# Patient Record
Sex: Female | Born: 1950 | Race: White | Hispanic: No | Smoking: Former smoker
Health system: Southern US, Community
[De-identification: ages and names within clinical notes are randomized; demographics above are authoritative.]

## PROBLEM LIST (undated history)

## (undated) DIAGNOSIS — I10 Essential (primary) hypertension: Secondary | ICD-10-CM

## (undated) DIAGNOSIS — M1990 Unspecified osteoarthritis, unspecified site: Secondary | ICD-10-CM

## (undated) DIAGNOSIS — F419 Anxiety disorder, unspecified: Secondary | ICD-10-CM

## (undated) DIAGNOSIS — E785 Hyperlipidemia, unspecified: Secondary | ICD-10-CM

## (undated) DIAGNOSIS — I4891 Unspecified atrial fibrillation: Secondary | ICD-10-CM

## (undated) DIAGNOSIS — F32A Depression, unspecified: Secondary | ICD-10-CM

## (undated) DIAGNOSIS — K219 Gastro-esophageal reflux disease without esophagitis: Secondary | ICD-10-CM

## (undated) DIAGNOSIS — J449 Chronic obstructive pulmonary disease, unspecified: Secondary | ICD-10-CM

## (undated) DIAGNOSIS — H9193 Unspecified hearing loss, bilateral: Secondary | ICD-10-CM

## (undated) DIAGNOSIS — R519 Headache, unspecified: Secondary | ICD-10-CM

## (undated) HISTORY — PX: TUBAL LIGATION: SHX77

## (undated) HISTORY — PX: ABDOMINAL HYSTERECTOMY: SHX81

## (undated) HISTORY — PX: TONSILLECTOMY: SUR1361

---

## 2003-11-23 ENCOUNTER — Other Ambulatory Visit: Payer: Self-pay

## 2005-08-19 ENCOUNTER — Ambulatory Visit: Payer: Self-pay | Admitting: Internal Medicine

## 2005-10-05 ENCOUNTER — Ambulatory Visit: Payer: Self-pay | Admitting: Internal Medicine

## 2006-04-13 ENCOUNTER — Ambulatory Visit: Payer: Self-pay | Admitting: Internal Medicine

## 2006-07-31 ENCOUNTER — Ambulatory Visit: Payer: Self-pay | Admitting: Unknown Physician Specialty

## 2007-02-07 ENCOUNTER — Ambulatory Visit: Payer: Self-pay | Admitting: Gastroenterology

## 2007-08-31 ENCOUNTER — Ambulatory Visit: Payer: Self-pay | Admitting: Internal Medicine

## 2008-10-03 ENCOUNTER — Ambulatory Visit: Payer: Self-pay | Admitting: Internal Medicine

## 2009-12-28 HISTORY — PX: COLONOSCOPY: SHX5424

## 2009-12-28 HISTORY — PX: ESOPHAGOGASTRODUODENOSCOPY ENDOSCOPY: SHX5814

## 2009-12-30 ENCOUNTER — Ambulatory Visit: Payer: Self-pay | Admitting: Family Medicine

## 2010-04-04 ENCOUNTER — Ambulatory Visit: Payer: Self-pay | Admitting: Unknown Physician Specialty

## 2010-05-30 ENCOUNTER — Ambulatory Visit: Payer: Self-pay | Admitting: Gastroenterology

## 2012-06-21 ENCOUNTER — Ambulatory Visit: Payer: Self-pay | Admitting: Internal Medicine

## 2013-07-03 ENCOUNTER — Ambulatory Visit: Payer: Self-pay | Admitting: Internal Medicine

## 2014-02-26 ENCOUNTER — Emergency Department: Payer: Self-pay | Admitting: Emergency Medicine

## 2014-02-26 LAB — URINALYSIS, COMPLETE
Bilirubin,UR: NEGATIVE
Glucose,UR: NEGATIVE mg/dL (ref 0–75)
Ketone: NEGATIVE
Nitrite: NEGATIVE
PH: 5 (ref 4.5–8.0)
PROTEIN: NEGATIVE
RBC,UR: 13 /HPF (ref 0–5)
SPECIFIC GRAVITY: 1.019 (ref 1.003–1.030)

## 2014-02-26 LAB — COMPREHENSIVE METABOLIC PANEL
ALT: 23 U/L (ref 12–78)
Albumin: 3.9 g/dL (ref 3.4–5.0)
Alkaline Phosphatase: 116 U/L
Anion Gap: 2 — ABNORMAL LOW (ref 7–16)
BUN: 12 mg/dL (ref 7–18)
Bilirubin,Total: 0.2 mg/dL (ref 0.2–1.0)
CHLORIDE: 106 mmol/L (ref 98–107)
Calcium, Total: 8.9 mg/dL (ref 8.5–10.1)
Co2: 32 mmol/L (ref 21–32)
Creatinine: 0.88 mg/dL (ref 0.60–1.30)
EGFR (African American): >60
GLUCOSE: 92 mg/dL (ref 65–99)
Osmolality: 279 (ref 275–301)
Potassium: 3.7 mmol/L (ref 3.5–5.1)
SGOT(AST): 25 U/L (ref 15–37)
SODIUM: 140 mmol/L (ref 136–145)
Total Protein: 7.2 g/dL (ref 6.4–8.2)

## 2014-02-26 LAB — CBC
HCT: 45.2 % (ref 35.0–47.0)
HGB: 15 g/dL (ref 12.0–16.0)
MCH: 30.7 pg (ref 26.0–34.0)
MCHC: 33.1 g/dL (ref 32.0–36.0)
MCV: 93 fL (ref 80–100)
Platelet: 251 10*3/uL (ref 150–440)
RBC: 4.88 10*6/uL (ref 3.80–5.20)
RDW: 13.7 % (ref 11.5–14.5)
WBC: 9.2 10*3/uL (ref 3.6–11.0)

## 2014-02-26 LAB — TROPONIN I: Troponin-I: <0.02 ng/mL

## 2014-02-26 LAB — ETHANOL
Ethanol %: <0.003 % (ref 0.000–0.080)
Ethanol: <3 mg/dL

## 2014-02-27 LAB — DRUG SCREEN, URINE

## 2014-11-28 ENCOUNTER — Ambulatory Visit: Payer: Self-pay

## 2015-02-05 DIAGNOSIS — E7800 Pure hypercholesterolemia, unspecified: Secondary | ICD-10-CM | POA: Insufficient documentation

## 2015-02-05 DIAGNOSIS — K219 Gastro-esophageal reflux disease without esophagitis: Secondary | ICD-10-CM | POA: Diagnosis present

## 2016-09-22 ENCOUNTER — Other Ambulatory Visit: Payer: Self-pay | Admitting: Family Medicine

## 2016-09-22 DIAGNOSIS — Z1231 Encounter for screening mammogram for malignant neoplasm of breast: Secondary | ICD-10-CM

## 2016-10-16 ENCOUNTER — Ambulatory Visit
Admission: RE | Admit: 2016-10-16 | Discharge: 2016-10-16 | Disposition: A | Discharge status: Home / Self Care | Payer: 59 | Source: Ambulatory Visit | Attending: Family Medicine | Admitting: Family Medicine

## 2016-10-16 ENCOUNTER — Other Ambulatory Visit: Payer: Self-pay | Admitting: Family Medicine

## 2016-10-16 DIAGNOSIS — Z1231 Encounter for screening mammogram for malignant neoplasm of breast: Secondary | ICD-10-CM

## 2017-12-24 ENCOUNTER — Other Ambulatory Visit: Payer: Self-pay | Admitting: Family Medicine

## 2017-12-24 DIAGNOSIS — M5416 Radiculopathy, lumbar region: Secondary | ICD-10-CM

## 2017-12-25 ENCOUNTER — Other Ambulatory Visit: Payer: Self-pay | Admitting: Physical Medicine and Rehabilitation

## 2017-12-25 DIAGNOSIS — M545 Low back pain: Secondary | ICD-10-CM

## 2018-01-03 ENCOUNTER — Ambulatory Visit: Payer: 59

## 2018-01-03 ENCOUNTER — Ambulatory Visit
Admission: RE | Admit: 2018-01-03 | Discharge: 2018-01-03 | Disposition: A | Discharge status: Home / Self Care | Payer: 59 | Source: Ambulatory Visit | Attending: Physical Medicine and Rehabilitation | Admitting: Physical Medicine and Rehabilitation

## 2018-01-03 DIAGNOSIS — M545 Low back pain: Secondary | ICD-10-CM

## 2019-06-01 DIAGNOSIS — J449 Chronic obstructive pulmonary disease, unspecified: Secondary | ICD-10-CM | POA: Diagnosis present

## 2020-01-23 ENCOUNTER — Ambulatory Visit: Payer: 59 | Attending: Internal Medicine

## 2020-01-23 DIAGNOSIS — Z8616 Personal history of COVID-19: Secondary | ICD-10-CM

## 2020-01-23 DIAGNOSIS — Z20822 Contact with and (suspected) exposure to covid-19: Secondary | ICD-10-CM

## 2020-01-23 HISTORY — DX: Personal history of COVID-19: Z86.16

## 2020-01-24 LAB — NOVEL CORONAVIRUS, NAA: SARS-CoV-2, NAA: DETECTED — AB

## 2020-01-25 ENCOUNTER — Telehealth: Payer: Self-pay | Admitting: Internal Medicine

## 2020-01-25 NOTE — Telephone Encounter (Signed)
Called to discuss with patient about Covid symptoms and the use of bamlanivimab, a monoclonal antibody infusion for those with mild to moderate Covid symptoms and at high risk of hospitalization.   Pt appears to qualify for this infusion at [facility] infusion center due to co-morbid conditions and/or member of at risk group. [phone] with hx of htn and COPD. Need to determine if symptoms and, if so, when they started to determine if pt is a candidate for infusion.  Unable to reach pt. Left message to return call.   Alan Ripper, NP-C [place]

## 2020-01-27 ENCOUNTER — Ambulatory Visit (HOSPITAL_COMMUNITY): Payer: 59

## 2020-02-05 ENCOUNTER — Emergency Department: Payer: Medicare Other

## 2020-02-05 ENCOUNTER — Emergency Department
Admission: EM | Admit: 2020-02-05 | Discharge: 2020-02-05 | Disposition: A | Discharge status: Home / Self Care | Payer: Medicare Other | Attending: Emergency Medicine | Admitting: Emergency Medicine

## 2020-02-05 ENCOUNTER — Other Ambulatory Visit: Payer: Self-pay

## 2020-02-05 ENCOUNTER — Encounter: Payer: Self-pay | Admitting: Emergency Medicine

## 2020-02-05 DIAGNOSIS — M5417 Radiculopathy, lumbosacral region: Secondary | ICD-10-CM

## 2020-02-05 DIAGNOSIS — I10 Essential (primary) hypertension: Secondary | ICD-10-CM | POA: Diagnosis not present

## 2020-02-05 DIAGNOSIS — Z8616 Personal history of COVID-19: Secondary | ICD-10-CM | POA: Diagnosis not present

## 2020-02-05 DIAGNOSIS — F17210 Nicotine dependence, cigarettes, uncomplicated: Secondary | ICD-10-CM | POA: Diagnosis not present

## 2020-02-05 DIAGNOSIS — M1611 Unilateral primary osteoarthritis, right hip: Secondary | ICD-10-CM | POA: Insufficient documentation

## 2020-02-05 DIAGNOSIS — M79661 Pain in right lower leg: Secondary | ICD-10-CM | POA: Insufficient documentation

## 2020-02-05 DIAGNOSIS — Z79899 Other long term (current) drug therapy: Secondary | ICD-10-CM | POA: Diagnosis not present

## 2020-02-05 DIAGNOSIS — M25551 Pain in right hip: Secondary | ICD-10-CM | POA: Diagnosis present

## 2020-02-05 HISTORY — DX: Essential (primary) hypertension: I10

## 2020-02-05 HISTORY — DX: Anxiety disorder, unspecified: F41.9

## 2020-02-05 HISTORY — DX: Gastro-esophageal reflux disease without esophagitis: K21.9

## 2020-02-05 LAB — COMPREHENSIVE METABOLIC PANEL
ALT: 14 U/L (ref 0–44)
AST: 15 U/L (ref 15–41)
Albumin: 3.7 g/dL (ref 3.5–5.0)
Alkaline Phosphatase: 98 U/L (ref 38–126)
Anion gap: 9 (ref 5–15)
BUN: 15 mg/dL (ref 8–23)
CO2: 25 mmol/L (ref 22–32)
Calcium: 8.8 mg/dL — ABNORMAL LOW (ref 8.9–10.3)
Chloride: 101 mmol/L (ref 98–111)
Creatinine, Ser: 0.68 mg/dL (ref 0.44–1.00)
GFR calc Af Amer: >60 mL/min (ref >60)
GFR calc non Af Amer: >60 mL/min (ref >60)
Glucose, Bld: 79 mg/dL (ref 70–99)
Potassium: 4.2 mmol/L (ref 3.5–5.1)
Sodium: 135 mmol/L (ref 135–145)
Total Bilirubin: 0.8 mg/dL (ref 0.3–1.2)
Total Protein: 7.3 g/dL (ref 6.5–8.1)

## 2020-02-05 LAB — URINALYSIS, COMPLETE (UACMP) WITH MICROSCOPIC
Bacteria, UA: NONE SEEN
Bilirubin Urine: NEGATIVE
Glucose, UA: NEGATIVE mg/dL
Hgb urine dipstick: NEGATIVE
Ketones, ur: NEGATIVE mg/dL
Nitrite: NEGATIVE
Protein, ur: NEGATIVE mg/dL
Specific Gravity, Urine: 1.012 (ref 1.005–1.030)
pH: 5 (ref 5.0–8.0)

## 2020-02-05 LAB — CBC
HCT: 41.9 % (ref 36.0–46.0)
Hemoglobin: 13.9 g/dL (ref 12.0–15.0)
MCH: 30.8 pg (ref 26.0–34.0)
MCHC: 33.2 g/dL (ref 30.0–36.0)
MCV: 92.7 fL (ref 80.0–100.0)
Platelets: 349 10*3/uL (ref 150–400)
RBC: 4.52 MIL/uL (ref 3.87–5.11)
RDW: 13 % (ref 11.5–15.5)
WBC: 10.5 10*3/uL (ref 4.0–10.5)
nRBC: 0 % (ref 0.0–0.2)

## 2020-02-05 MED ORDER — LIDOCAINE 5 % EX PTCH: 1.0000 | MEDICATED_PATCH | CUTANEOUS | 0 refills | Status: DC

## 2020-02-05 MED ORDER — HYDROCODONE-ACETAMINOPHEN 5-325 MG PO TABS: 0.5000 | ORAL_TABLET | Freq: Four times a day (QID) | ORAL | 0 refills | Status: DC | PRN

## 2020-02-05 MED ORDER — MELOXICAM 7.5 MG PO TABS: 7.5000 mg | ORAL_TABLET | Freq: Every day | ORAL | 0 refills | Status: DC

## 2020-02-05 MED ORDER — IBUPROFEN 400 MG PO TABS: 400.0000 mg | ORAL_TABLET | Freq: Three times a day (TID) | ORAL | 0 refills | Status: DC | PRN

## 2020-02-05 MED ORDER — HYDROCODONE-ACETAMINOPHEN 5-325 MG PO TABS
1.0000 | ORAL_TABLET | Freq: Once | ORAL | Status: AC
  Administered 2020-02-05: 14:00:00 1 via ORAL
  Filled 2020-02-05: qty 1

## 2020-02-05 NOTE — ED Notes (Signed)
Pt verbalized understanding of discharge instructions. NAD at this time. 

## 2020-02-05 NOTE — ED Notes (Signed)
See triage note  Presents with right hip pain  Stats pain started about 1 month ago  Denies any injury    Pain has increased over the past 2 days     Unable to bear full wt

## 2020-02-05 NOTE — ED Provider Notes (Signed)
[facility] Emergency Department Provider Note  ____________________________________________  Time seen: Approximately 1:38 PM  I have reviewed the triage vital signs and the nursing notes.   HISTORY  Chief Complaint No chief complaint on file.    HPI Sabrina Hudson is a 69 y.o. female that presents to the emergency department for evaluation of right hip pain that radiates into right thigh worsening for 1 month.  Patient states that she will get "lightning bolts" that start in her hip and go down to her knee.  She also has some right low back pain.  Patient was unable to bear weight on right hip this morning due to hip pain.  No bowel or bladder dysfunction or saddle anesthesias. No trauma.  Patient had Covid in January and the symptoms are improving.  Patient has a history of bursitis.  No fevers.   Past Medical History:  Diagnosis Date  . Anxiety   . GERD (gastroesophageal reflux disease)   . Hypertension     There are no problems to display for this patient.   Past Surgical History:  Procedure Laterality Date  . ABDOMINAL HYSTERECTOMY    . TONSILLECTOMY      Prior to Admission medications   Medication Sig Start Date End Date Taking? Authorizing Provider  DULoxetine (CYMBALTA) 60 MG capsule Take 60 mg by mouth daily.   Yes [provider]  valsartan (DIOVAN) 320 MG tablet Take 320 mg by mouth daily.   Yes [provider]  HYDROcodone-acetaminophen (NORCO) 5-325 MG tablet Take 0.5 tablets by mouth every 6 (six) hours as needed for moderate pain. 02/05/20   Laban Emperor, PA-C  lidocaine (LIDODERM) 5 % Place 1 patch onto the skin daily. Remove & Discard patch within 12 hours or as directed by MD 02/05/20   Laban Emperor, PA-C  meloxicam (MOBIC) 7.5 MG tablet Take 1 tablet (7.5 mg total) by mouth daily. 02/05/20 02/04/21  Laban Emperor, PA-C    Allergies Patient has no known allergies.  Family History  Problem Relation Age of Onset   . Breast cancer Neg Hx     Social History Social History   Tobacco Use  . Smoking status: Current Every Day Smoker    Types: Cigarettes  . Smokeless tobacco: Never Used  Substance Use Topics  . Alcohol use: Not on file  . Drug use: Not on file     Review of Systems  Constitutional: No fever/chills Respiratory: No SOB. Gastrointestinal: No abdominal pain.  No nausea, no vomiting.  Musculoskeletal: Positive for hip pain. Skin: Negative for rash, abrasions, lacerations, ecchymosis. Neurological: Negative for headaches, numbness or tingling  ____________________________________________   PHYSICAL EXAM:  VITAL SIGNS: ED Triage Vitals  Enc Vitals Group     BP 02/05/20 1216 (!) 143/61     Pulse Rate 02/05/20 1219 72     Resp 02/05/20 1216 16     Temp 02/05/20 1216 98.6 F (37 C)     Temp Source 02/05/20 1216 Oral     SpO2 02/05/20 1216 96 %     Weight 02/05/20 1216 180 lb (81.6 kg)     Height 02/05/20 1216 5\' 7"  (1.702 m)     Head Circumference --      Peak Flow --      Pain Score 02/05/20 1215 8     Pain Loc --      Pain Edu? --      Excl. in [place]? --      Constitutional:  Alert and oriented. Well appearing and in no acute distress. Eyes: Conjunctivae are normal. PERRL. EOMI. Head: Atraumatic. ENT:      Ears:      Nose: No congestion/rhinnorhea.      Mouth/Throat: Mucous membranes are moist.  Neck: No stridor.  Cardiovascular: Normal rate, regular rhythm.  Good peripheral circulation. Respiratory: Normal respiratory effort without tachypnea or retractions. Lungs CTAB. Good air entry to the bases with no decreased or absent breath sounds. Gastrointestinal: Bowel sounds 4 quadrants. Soft and nontender to palpation. No guarding or rigidity. No palpable masses. No distention.  Musculoskeletal: Full range of motion to all extremities. No gross deformities appreciated.  No point tenderness over lumbar spine or lumbar paraspinal muscles.  Tenderness to right  trochanteric bursa. Full ROM of right hip.  Strength equal in lower extremities bilaterally.  Weightbearing and normal gait with walker. Neurologic:  Normal speech and language. No gross focal neurologic deficits are appreciated.  Skin:  Skin is warm, dry and intact. No rash noted. Psychiatric: Mood and affect are normal. Speech and behavior are normal. Patient exhibits appropriate insight and judgement.   ____________________________________________   LABS (all labs ordered are listed, but only abnormal results are displayed)  Labs Reviewed  URINALYSIS, COMPLETE (UACMP) WITH MICROSCOPIC - Abnormal; Notable for the following components:      Result Value   Color, Urine YELLOW (*)    APPearance CLEAR (*)    Leukocytes,Ua TRACE (*)    All other components within normal limits  COMPREHENSIVE METABOLIC PANEL - Abnormal; Notable for the following components:   Calcium 8.8 (*)    All other components within normal limits  URINE CULTURE  CBC   ____________________________________________  EKG   ____________________________________________  RADIOLOGY Robinette Haines, personally viewed and evaluated these images (plain radiographs) as part of my medical decision making, as well as reviewing the written report by the radiologist.  US Venous Img Lower Unilateral Right  Result Date: 02/05/2020 CLINICAL DATA:  Right lower extremity pain and edema EXAM: RIGHT LOWER EXTREMITY VENOUS DUPLEX ULTRASOUND TECHNIQUE: Gray-scale sonography with graded compression, as well as color Doppler and duplex ultrasound were performed to evaluate the right lower extremity deep venous system from the level of the common femoral vein and including the common femoral, femoral, profunda femoral, popliteal and calf veins including the posterior tibial, peroneal and gastrocnemius veins when visible. The superficial great saphenous vein was also interrogated. Spectral Doppler was utilized to evaluate flow at rest and  with distal augmentation maneuvers in the common femoral, femoral and popliteal veins. COMPARISON:  None. FINDINGS: Contralateral Common Femoral Vein: Respiratory phasicity is normal and symmetric with the symptomatic side. No evidence of thrombus. Normal compressibility. Common Femoral Vein: No evidence of thrombus. Normal compressibility, respiratory phasicity and response to augmentation. Saphenofemoral Junction: No evidence of thrombus. Normal compressibility and flow on color Doppler imaging. Profunda Femoral Vein: No evidence of thrombus. Normal compressibility and flow on color Doppler imaging. Femoral Vein: No evidence of thrombus. Normal compressibility, respiratory phasicity and response to augmentation. Popliteal Vein: No evidence of thrombus. Normal compressibility, respiratory phasicity and response to augmentation. Calf Veins: No evidence of thrombus. Normal compressibility and flow on color Doppler imaging. Superficial Great Saphenous Vein: No evidence of thrombus. Normal compressibility. Venous Reflux:  None. Other Findings:  None. IMPRESSION: No evidence of deep venous thrombosis in the right lower extremity. Left common femoral vein also patent. Electronically Signed   By: Lowella Grip III M.D.   On: 02/05/2020 14:38  DG Hip Unilat W or Wo Pelvis 2-3 Views Right  Result Date: 02/05/2020 CLINICAL DATA:  RIGHT hip pain for 1 month worsened in past 2 days, hip pain radiating down RIGHT thigh, difficulty walking EXAM: DG HIP (WITH OR WITHOUT PELVIS) 2-3V RIGHT COMPARISON:  None FINDINGS: Osseous demineralization. Hip and SI joint spaces preserved. Degenerative changes RIGHT hip joint with cortical loss and subchondral cyst at acetabular roof. No acute fracture, dislocation or additional bone destruction. Mild degenerative disc and facet disease changes at visualized lumbar spine. IMPRESSION: Degenerative changes of the RIGHT hip joint and visualized lower lumbar spine. Osseous  demineralization. Electronically Signed   By: Lavonia Dana M.D.   On: 02/05/2020 13:11    ____________________________________________    PROCEDURES  Procedure(s) performed:    Procedures    Medications  HYDROcodone-acetaminophen (NORCO/VICODIN) 5-325 MG per tablet 1 tablet (1 tablet Oral Given 02/05/20 1354)     ____________________________________________   INITIAL IMPRESSION / ASSESSMENT AND PLAN / ED COURSE  Pertinent labs & imaging results that were available during my care of the patient were reviewed by me and considered in my medical decision making (see chart for details).  Review of the [place] CSRS was performed in accordance of the [place] prior to dispensing any controlled drugs.    Patient presented the emergency department for evaluation of right hip pain.  Symptoms are consistent with osteoarthritis and radiculopathy.  Vital signs and exam are reassuring.  X-ray consistent with osteoarthritis.  Ultrasound negative for DVT.  Lab work largely unremarkable.  Patient states that pain significantly improved with Vicodin.  She is able to ambulate normally by herself with a walker.  She feels well.  Patient will be discharged home with prescriptions for Mobic, Lidoderm patch, a low dose and short course of Vicodin. Patient is to follow up with primary care and orthopedics as directed. Patient is given ED precautions to return to the ED for any worsening or new symptoms.  LAQUESHIA REISH was evaluated in Emergency Department on 02/05/2020 for the symptoms described in the history of present illness. She was evaluated in the context of the global COVID-19 pandemic, which necessitated consideration that the patient might be at risk for infection with the SARS-CoV-2 virus that causes COVID-19. Institutional protocols and algorithms that pertain to the evaluation of patients at risk for COVID-19 are in a state of rapid change based on information released by regulatory bodies including the  CDC and federal and state organizations. These policies and algorithms were followed during the patient's care in the ED.   ____________________________________________  FINAL CLINICAL IMPRESSION(S) / ED DIAGNOSES  Final diagnoses:  Osteoarthritis of right hip, unspecified osteoarthritis type  Lumbosacral radiculopathy      NEW MEDICATIONS STARTED DURING THIS VISIT:      This chart was dictated using voice recognition software/Dragon. Despite best efforts to proofread, errors can occur which can change the meaning. Any change was purely unintentional.    Laban Emperor, PA-C 02/05/20 1719    [facility], MD 02/06/20 [id number]

## 2020-02-05 NOTE — ED Triage Notes (Signed)
C/O right hip pain x 1 month.  Sates for past two days hip pain has worsend.  C/O hip pain radiating down right thigh.  difficulty walking.

## 2020-02-05 NOTE — Discharge Instructions (Signed)
Your lab work is all reassuring.  Your x-ray shows that you have arthritis in your hip and in your back.  Your ultrasound was negative for a blood clot.  Please begin Mobic for inflammation.  You can take half of a Vicodin for extreme pain.  his medication may make you sleepy and if it does please discontinue medication. You can place Lidoderm patch to your hip. You can apply heat to your hip.  Please use walker to help you walk.  Please call primary care for a follow-up appointment this week.  Please follow-up with orthopedics as needed.  Return to the emergency department for any change or worsening of symptoms.

## 2020-02-06 LAB — URINE CULTURE: Culture: <10000 — AB

## 2020-05-14 ENCOUNTER — Other Ambulatory Visit: Payer: Self-pay | Admitting: Orthopedic Surgery

## 2020-05-18 ENCOUNTER — Emergency Department
Admission: EM | Admit: 2020-05-18 | Discharge: 2020-05-18 | Disposition: A | Discharge status: Home / Self Care | Payer: Medicare Other | Source: Home / Self Care | Attending: Emergency Medicine | Admitting: Emergency Medicine

## 2020-05-18 ENCOUNTER — Emergency Department: Payer: Medicare Other

## 2020-05-18 ENCOUNTER — Encounter: Payer: Self-pay | Admitting: Emergency Medicine

## 2020-05-18 ENCOUNTER — Other Ambulatory Visit: Payer: Self-pay

## 2020-05-18 DIAGNOSIS — J441 Chronic obstructive pulmonary disease with (acute) exacerbation: Secondary | ICD-10-CM

## 2020-05-18 DIAGNOSIS — G8929 Other chronic pain: Secondary | ICD-10-CM | POA: Insufficient documentation

## 2020-05-18 DIAGNOSIS — Z20822 Contact with and (suspected) exposure to covid-19: Secondary | ICD-10-CM | POA: Insufficient documentation

## 2020-05-18 DIAGNOSIS — M25551 Pain in right hip: Secondary | ICD-10-CM | POA: Insufficient documentation

## 2020-05-18 DIAGNOSIS — Z79899 Other long term (current) drug therapy: Secondary | ICD-10-CM | POA: Insufficient documentation

## 2020-05-18 DIAGNOSIS — R05 Cough: Secondary | ICD-10-CM | POA: Insufficient documentation

## 2020-05-18 DIAGNOSIS — F17210 Nicotine dependence, cigarettes, uncomplicated: Secondary | ICD-10-CM | POA: Insufficient documentation

## 2020-05-18 DIAGNOSIS — E876 Hypokalemia: Secondary | ICD-10-CM | POA: Diagnosis not present

## 2020-05-18 DIAGNOSIS — I10 Essential (primary) hypertension: Secondary | ICD-10-CM | POA: Insufficient documentation

## 2020-05-18 LAB — COMPREHENSIVE METABOLIC PANEL
ALT: 15 U/L (ref 0–44)
AST: 17 U/L (ref 15–41)
Albumin: 4.1 g/dL (ref 3.5–5.0)
Alkaline Phosphatase: 88 U/L (ref 38–126)
Anion gap: 10 (ref 5–15)
BUN: 11 mg/dL (ref 8–23)
CO2: 25 mmol/L (ref 22–32)
Calcium: 9 mg/dL (ref 8.9–10.3)
Chloride: 104 mmol/L (ref 98–111)
Creatinine, Ser: 0.64 mg/dL (ref 0.44–1.00)
GFR calc Af Amer: >60 mL/min (ref >60)
GFR calc non Af Amer: >60 mL/min (ref >60)
Glucose, Bld: 99 mg/dL (ref 70–99)
Potassium: 3.5 mmol/L (ref 3.5–5.1)
Sodium: 139 mmol/L (ref 135–145)
Total Bilirubin: 0.7 mg/dL (ref 0.3–1.2)
Total Protein: 6.9 g/dL (ref 6.5–8.1)

## 2020-05-18 LAB — CBC
HCT: 41.3 % (ref 36.0–46.0)
Hemoglobin: 13.8 g/dL (ref 12.0–15.0)
MCH: 31.1 pg (ref 26.0–34.0)
MCHC: 33.4 g/dL (ref 30.0–36.0)
MCV: 93 fL (ref 80.0–100.0)
Platelets: 208 10*3/uL (ref 150–400)
RBC: 4.44 MIL/uL (ref 3.87–5.11)
RDW: 13.2 % (ref 11.5–15.5)
WBC: 5.5 10*3/uL (ref 4.0–10.5)
nRBC: 0 % (ref 0.0–0.2)

## 2020-05-18 LAB — SARS CORONAVIRUS 2 BY RT PCR (HOSPITAL ORDER, PERFORMED IN ~~LOC~~ HOSPITAL LAB): SARS Coronavirus 2: NEGATIVE

## 2020-05-18 LAB — PROCALCITONIN: Procalcitonin: <0.1 ng/mL

## 2020-05-18 LAB — LACTIC ACID, PLASMA: Lactic Acid, Venous: 0.7 mmol/L (ref 0.5–1.9)

## 2020-05-18 MED ORDER — AZITHROMYCIN 250 MG PO TABS: ORAL_TABLET | ORAL | 0 refills | Status: AC

## 2020-05-18 MED ORDER — PREDNISONE 20 MG PO TABS: 40.0000 mg | ORAL_TABLET | Freq: Every day | ORAL | 0 refills | Status: DC

## 2020-05-18 MED ORDER — IOHEXOL 350 MG/ML SOLN
75.0000 mL | Freq: Once | INTRAVENOUS | Status: AC | PRN
  Administered 2020-05-18: 75 mL via INTRAVENOUS

## 2020-05-18 NOTE — ED Provider Notes (Signed)
[facility] Emergency Department Provider Note  Time seen: 9:00 AM  I have reviewed the triage vital signs and the nursing notes.   HISTORY  Chief Complaint Shortness of Breath   HPI Sabrina Hudson is a 69 y.o. female with a past medical history of anxiety, reflux, hypertension presents to the emergency department for shortness of breath and cough.  According to the patient for the past 2 days she has been short of breath with frequent wet sounding cough.  States she feels like she has a lot of sputum that she cannot get out.  Patient has a history of COPD but does not wear oxygen at baseline, states rarely needs her inhalers at baseline.  Patient does smoke cigarettes.  Patient states low-grade subjective fevers.  Patient also states right hip pain which is chronic has a hip replacement scheduled for next month, takes Tylenol/ibuprofen for this.  Denies any chest pain.  No abdominal pain, nausea vomiting or diarrhea.   Patient states she was diagnosed with Covid back in January/February.  States she has had some slight shortness of breath ever since but not to this degree.  Past Medical History:  Diagnosis Date  . Anxiety   . GERD (gastroesophageal reflux disease)   . Hypertension     There are no problems to display for this patient.   Past Surgical History:  Procedure Laterality Date  . ABDOMINAL HYSTERECTOMY    . TONSILLECTOMY      Prior to Admission medications   Medication Sig Start Date End Date Taking? Authorizing Provider  acetaminophen (TYLENOL) 500 MG tablet Take 500 mg by mouth every 6 (six) hours as needed for moderate pain.    [provider]  DULoxetine (CYMBALTA) 60 MG capsule Take 60 mg by mouth daily.    [provider]  fluticasone (FLONASE) 50 MCG/ACT nasal spray Place 1 spray into both nostrils daily as needed for allergies or rhinitis.    [provider]  HYDROcodone-acetaminophen (NORCO) 5-325 MG tablet  Take 0.5 tablets by mouth every 6 (six) hours as needed for moderate pain. Patient taking differently: Take 0.5-1 tablets by mouth every 6 (six) hours as needed for moderate pain.  02/05/20   Laban Emperor, PA-C  ibuprofen (ADVIL) 200 MG tablet Take 400 mg by mouth every 6 (six) hours as needed for moderate pain.    [provider]  lidocaine (LIDODERM) 5 % Place 1 patch onto the skin daily. Remove & Discard patch within 12 hours or as directed by MD Patient not taking: Reported on 05/15/2020 02/05/20   Laban Emperor, PA-C  meloxicam (MOBIC) 7.5 MG tablet Take 1 tablet (7.5 mg total) by mouth daily. Patient not taking: Reported on 05/15/2020 02/05/20 02/04/21  Laban Emperor, PA-C  omeprazole (PRILOSEC) 40 MG capsule Take 40 mg by mouth daily. 02/26/20   [provider]  valsartan-hydrochlorothiazide (DIOVAN-HCT) 80-12.5 MG tablet Take 1 tablet by mouth daily.    [provider]    No Known Allergies  Family History  Problem Relation Age of Onset  . Breast cancer Neg Hx     Social History Social History   Tobacco Use  . Smoking status: Current Every Day Smoker    Types: Cigarettes  . Smokeless tobacco: Never Used  Substance Use Topics  . Alcohol use: Not on file  . Drug use: Not on file    Review of Systems Constitutional: Subjective low-grade fever x2 days Cardiovascular: Negative for chest pain. Respiratory: Mild shortness  of breath.  Positive for cough. Gastrointestinal: Negative for abdominal pain, vomiting and diarrhea. Genitourinary: Negative for urinary compaints Musculoskeletal: Negative for musculoskeletal complaints Neurological: Positive for headache. All other ROS negative  ____________________________________________   PHYSICAL EXAM:  VITAL SIGNS: ED Triage Vitals  Enc Vitals Group     BP 05/18/20 0843 (!) 168/77     Pulse Rate 05/18/20 0843 85     Resp 05/18/20 0843 (!) 22     Temp 05/18/20 0843 98.4 F (36.9 C)     Temp Source  05/18/20 0843 Oral     SpO2 05/18/20 0843 92 %     Weight 05/18/20 0844 178 lb (80.7 kg)     Height 05/18/20 0844 5\' 7"  (1.702 m)     Head Circumference --      Peak Flow --      Pain Score 05/18/20 0844 6     Pain Loc --      Pain Edu? --      Excl. in [place]? --     Constitutional: Alert and oriented. Well appearing and in no distress. Eyes: Normal exam ENT      Head: Normocephalic and atraumatic.      Mouth/Throat: Mucous membranes are moist. Cardiovascular: Normal rate, regular rhythm.  Respiratory: Mild tachypnea with moderate rhonchi bilaterally with mild expiratory wheeze bilaterally. Gastrointestinal: Soft and nontender. No distention.  Musculoskeletal: Nontender with normal range of motion in all extremities. No lower extremity tenderness or edema. Neurologic:  Normal speech and language. No gross focal neurologic deficits are appreciated. Skin:  Skin is warm, dry and intact.  Psychiatric: Mood and affect are normal. Speech and behavior are normal.   ____________________________________________    EKG  EKG viewed and interpreted by myself shows a normal sinus rhythm 80 bpm with a narrow QRS, normal axis, normal intervals, no concerning ST changes.  ____________________________________________    RADIOLOGY  CT scan negative for acute abnormality  ____________________________________________   INITIAL IMPRESSION / ASSESSMENT AND PLAN / ED COURSE  Pertinent labs & imaging results that were available during my care of the patient were reviewed by me and considered in my medical decision making (see chart for details).   Patient presents to the emergency department for 2 days of subjective fever wet sounding cough and shortness of breath.  Differential would include pneumonia, URI, COPD exacerbation.  We will check labs, chest x-ray and continue to closely monitor.  Patient agreeable to plan of care.  Overall patient appears well, no acute distress satting in the low 90s  on room air.  Patient's labs are largely within normal limits.  CT scan is negative for acute abnormality.  Highly suspect COPD exacerbation.  We will discharge on prednisone and Zithromax have the patient follow-up with her doctor.  Patient agreeable to plan of care.  Sabrina Hudson was evaluated in Emergency Department on 05/18/2020 for the symptoms described in the history of present illness. She was evaluated in the context of the global COVID-19 pandemic, which necessitated consideration that the patient might be at risk for infection with the SARS-CoV-2 virus that causes COVID-19. Institutional protocols and algorithms that pertain to the evaluation of patients at risk for COVID-19 are in a state of rapid change based on information released by regulatory bodies including the CDC and federal and state organizations. These policies and algorithms were followed during the patient's care in the ED.  ____________________________________________   FINAL CLINICAL IMPRESSION(S) / ED DIAGNOSES  Dyspnea  Harvest Dark, MD 05/18/20 1148

## 2020-05-18 NOTE — ED Notes (Signed)
Patient given a sandwich tray with a cup of coffee.

## 2020-05-18 NOTE — ED Notes (Signed)
Patient discharged home with sister, patient received discharge papers and prescriptions. Patient appropriate and cooperative, Vital signs taken. NAD noted.

## 2020-05-18 NOTE — ED Triage Notes (Signed)
Pt has had wet cough, SHOB and per pt low grade temp for 2 days.  Temp 99.2  This AM but taking tylenol/motrin around the clock for hip pain; scheduled in June for replacement. VSS at this time.

## 2020-05-18 NOTE — ED Notes (Signed)
Patient is hard of hearing, but is able to read lips, or you can also speak with her sister who is at her bedside

## 2020-05-18 NOTE — ED Notes (Signed)
ECG performed by this tech in room 19

## 2020-05-20 ENCOUNTER — Encounter: Payer: Self-pay | Admitting: Internal Medicine

## 2020-05-20 ENCOUNTER — Other Ambulatory Visit: Payer: Self-pay

## 2020-05-20 ENCOUNTER — Inpatient Hospital Stay
Admission: EM | Admit: 2020-05-20 | Discharge: 2020-05-23 | DRG: 192 | Disposition: A | Discharge status: Home / Self Care | Payer: Medicare Other | Attending: Internal Medicine | Admitting: Internal Medicine

## 2020-05-20 ENCOUNTER — Emergency Department: Payer: Medicare Other

## 2020-05-20 ENCOUNTER — Inpatient Hospital Stay
Admission: RE | Admit: 2020-05-20 | Discharge: 2020-05-20 | Disposition: A | Discharge status: Home / Self Care | Payer: 59 | Source: Ambulatory Visit

## 2020-05-20 DIAGNOSIS — F17200 Nicotine dependence, unspecified, uncomplicated: Secondary | ICD-10-CM

## 2020-05-20 DIAGNOSIS — J441 Chronic obstructive pulmonary disease with (acute) exacerbation: Secondary | ICD-10-CM | POA: Diagnosis not present

## 2020-05-20 DIAGNOSIS — Z79899 Other long term (current) drug therapy: Secondary | ICD-10-CM

## 2020-05-20 DIAGNOSIS — Z79891 Long term (current) use of opiate analgesic: Secondary | ICD-10-CM

## 2020-05-20 DIAGNOSIS — R062 Wheezing: Secondary | ICD-10-CM

## 2020-05-20 DIAGNOSIS — Z20822 Contact with and (suspected) exposure to covid-19: Secondary | ICD-10-CM | POA: Diagnosis present

## 2020-05-20 DIAGNOSIS — F17210 Nicotine dependence, cigarettes, uncomplicated: Secondary | ICD-10-CM | POA: Diagnosis present

## 2020-05-20 DIAGNOSIS — K219 Gastro-esophageal reflux disease without esophagitis: Secondary | ICD-10-CM | POA: Diagnosis present

## 2020-05-20 DIAGNOSIS — I480 Paroxysmal atrial fibrillation: Secondary | ICD-10-CM | POA: Diagnosis present

## 2020-05-20 DIAGNOSIS — E876 Hypokalemia: Secondary | ICD-10-CM | POA: Diagnosis present

## 2020-05-20 DIAGNOSIS — I10 Essential (primary) hypertension: Secondary | ICD-10-CM | POA: Diagnosis present

## 2020-05-20 DIAGNOSIS — Z8616 Personal history of COVID-19: Secondary | ICD-10-CM

## 2020-05-20 DIAGNOSIS — M25551 Pain in right hip: Secondary | ICD-10-CM | POA: Diagnosis present

## 2020-05-20 DIAGNOSIS — F419 Anxiety disorder, unspecified: Secondary | ICD-10-CM | POA: Diagnosis present

## 2020-05-20 DIAGNOSIS — I4891 Unspecified atrial fibrillation: Secondary | ICD-10-CM

## 2020-05-20 DIAGNOSIS — R000 Tachycardia, unspecified: Secondary | ICD-10-CM

## 2020-05-20 LAB — BASIC METABOLIC PANEL
Anion gap: 8 (ref 5–15)
BUN: 13 mg/dL (ref 8–23)
CO2: 28 mmol/L (ref 22–32)
Calcium: 9.1 mg/dL (ref 8.9–10.3)
Chloride: 105 mmol/L (ref 98–111)
Creatinine, Ser: 0.67 mg/dL (ref 0.44–1.00)
GFR calc Af Amer: >60 mL/min (ref >60)
GFR calc non Af Amer: >60 mL/min (ref >60)
Glucose, Bld: 99 mg/dL (ref 70–99)
Potassium: 3 mmol/L — ABNORMAL LOW (ref 3.5–5.1)
Sodium: 141 mmol/L (ref 135–145)

## 2020-05-20 LAB — CBC WITH DIFFERENTIAL/PLATELET
Abs Immature Granulocytes: 0.02 10*3/uL (ref 0.00–0.07)
Basophils Absolute: 0.1 10*3/uL (ref 0.0–0.1)
Basophils Relative: 1 %
Eosinophils Absolute: 0.1 10*3/uL (ref 0.0–0.5)
Eosinophils Relative: 1 %
HCT: 47.8 % — ABNORMAL HIGH (ref 36.0–46.0)
Hemoglobin: 16 g/dL — ABNORMAL HIGH (ref 12.0–15.0)
Immature Granulocytes: 0 %
Lymphocytes Relative: 29 %
Lymphs Abs: 2.3 10*3/uL (ref 0.7–4.0)
MCH: 31.1 pg (ref 26.0–34.0)
MCHC: 33.5 g/dL (ref 30.0–36.0)
MCV: 93 fL (ref 80.0–100.0)
Monocytes Absolute: 0.8 10*3/uL (ref 0.1–1.0)
Monocytes Relative: 10 %
Neutro Abs: 4.8 10*3/uL (ref 1.7–7.7)
Neutrophils Relative %: 59 %
Platelets: 246 10*3/uL (ref 150–400)
RBC: 5.14 MIL/uL — ABNORMAL HIGH (ref 3.87–5.11)
RDW: 13.1 % (ref 11.5–15.5)
WBC: 8 10*3/uL (ref 4.0–10.5)
nRBC: 0 % (ref 0.0–0.2)

## 2020-05-20 LAB — TROPONIN I (HIGH SENSITIVITY): Troponin I (High Sensitivity): 6 ng/L (ref <18)

## 2020-05-20 MED ORDER — IPRATROPIUM-ALBUTEROL 0.5-2.5 (3) MG/3ML IN SOLN
3.0000 mL | Freq: Once | RESPIRATORY_TRACT | Status: AC
  Administered 2020-05-20: 3 mL via RESPIRATORY_TRACT
  Filled 2020-05-20: qty 3

## 2020-05-20 MED ORDER — IPRATROPIUM-ALBUTEROL 0.5-2.5 (3) MG/3ML IN SOLN
3.0000 mL | Freq: Four times a day (QID) | RESPIRATORY_TRACT | Status: DC
  Administered 2020-05-20 – 2020-05-21 (×6): 3 mL via RESPIRATORY_TRACT
  Filled 2020-05-20 (×6): qty 3

## 2020-05-20 MED ORDER — HYDROCOD POLST-CPM POLST ER 10-8 MG/5ML PO SUER
5.0000 mL | Freq: Once | ORAL | Status: AC
  Administered 2020-05-20: 5 mL via ORAL
  Filled 2020-05-20: qty 5

## 2020-05-20 MED ORDER — BENZONATATE 100 MG PO CAPS
100.0000 mg | ORAL_CAPSULE | Freq: Three times a day (TID) | ORAL | Status: DC | PRN
  Administered 2020-05-20 – 2020-05-21 (×2): 100 mg via ORAL
  Filled 2020-05-20 (×2): qty 1

## 2020-05-20 MED ORDER — POTASSIUM CHLORIDE CRYS ER 20 MEQ PO TBCR
40.0000 meq | EXTENDED_RELEASE_TABLET | Freq: Once | ORAL | Status: AC
  Administered 2020-05-20: 40 meq via ORAL
  Filled 2020-05-20: qty 2

## 2020-05-20 MED ORDER — ALBUTEROL SULFATE (2.5 MG/3ML) 0.083% IN NEBU: 2.5000 mg | INHALATION_SOLUTION | RESPIRATORY_TRACT | Status: DC | PRN

## 2020-05-20 MED ORDER — PREDNISONE 20 MG PO TABS
40.0000 mg | ORAL_TABLET | Freq: Every day | ORAL | Status: DC
  Administered 2020-05-21: 08:00:00 40 mg via ORAL
  Filled 2020-05-20: qty 2

## 2020-05-20 MED ORDER — POTASSIUM CHLORIDE 10 MEQ/100ML IV SOLN
10.0000 meq | INTRAVENOUS | Status: DC
  Administered 2020-05-20: 14:00:00 10 meq via INTRAVENOUS
  Filled 2020-05-20: qty 100

## 2020-05-20 MED ORDER — ENOXAPARIN SODIUM 40 MG/0.4ML ~~LOC~~ SOLN
40.0000 mg | SUBCUTANEOUS | Status: DC
  Administered 2020-05-20 – 2020-05-21 (×2): 40 mg via SUBCUTANEOUS
  Filled 2020-05-20 (×2): qty 0.4

## 2020-05-20 MED ORDER — POTASSIUM CHLORIDE 20 MEQ PO PACK
40.0000 meq | PACK | Freq: Two times a day (BID) | ORAL | Status: AC
  Administered 2020-05-20 (×2): 40 meq via ORAL
  Filled 2020-05-20 (×2): qty 2

## 2020-05-20 MED ORDER — METHYLPREDNISOLONE SODIUM SUCC 40 MG IJ SOLR
40.0000 mg | Freq: Four times a day (QID) | INTRAMUSCULAR | Status: AC
  Administered 2020-05-20 (×3): 40 mg via INTRAVENOUS
  Filled 2020-05-20 (×4): qty 1

## 2020-05-20 NOTE — ED Provider Notes (Signed)
[facility] Emergency Department Provider Note   ____________________________________________   First MD Initiated Contact with Patient 05/20/20 0405     (approximate)  I have reviewed the triage vital signs and the nursing notes.   HISTORY  Chief Complaint Shortness of Breath    HPI Sabrina Hudson is a 69 y.o. female brought to the ED from home via EMS with a chief complaint of COPD exacerbation.  Patient with a history of COPD not on home oxygen and Covid+ in January 2021 who was seen yesterday in the ED for same.  Had a negative work-up including CT chest and discharged home on azithromycin and prednisone.  States she is feeling worse.  Endorses subjective fevers, chest tightness, wheezing, wet sounding cough and shortness of breath.  Denies abdominal pain, nausea, vomiting or diarrhea.  Received 1 DuoNeb and 125 mg IV Solu-Medrol en route per EMS and states she is feeling better.       Past Medical History:  Diagnosis Date  . Anxiety   . GERD (gastroesophageal reflux disease)   . Hypertension     Patient Active Problem List   Diagnosis Date Noted  . COPD with acute exacerbation ([place]) 05/20/2020  . HTN (hypertension) 05/20/2020  . COPD exacerbation ([place]) 05/20/2020    Past Surgical History:  Procedure Laterality Date  . ABDOMINAL HYSTERECTOMY    . TONSILLECTOMY      Prior to Admission medications   Medication Sig Start Date End Date Taking? Authorizing Provider  acetaminophen (TYLENOL) 500 MG tablet Take 500 mg by mouth every 6 (six) hours as needed for moderate pain.    [provider]  azithromycin (ZITHROMAX Z-PAK) 250 MG tablet Take 2 tablets (500 mg) on  Day 1,  followed by 1 tablet (250 mg) once daily on Days 2 through 5. 05/18/20 05/23/20  Harvest Dark, MD  DULoxetine (CYMBALTA) 60 MG capsule Take 60 mg by mouth daily.    [provider]  fluticasone (FLONASE) 50 MCG/ACT nasal spray Place 1 spray into both  nostrils daily as needed for allergies or rhinitis.    [provider]  HYDROcodone-acetaminophen (NORCO) 5-325 MG tablet Take 0.5 tablets by mouth every 6 (six) hours as needed for moderate pain. Patient taking differently: Take 0.5-1 tablets by mouth every 6 (six) hours as needed for moderate pain.  02/05/20   Laban Emperor, PA-C  ibuprofen (ADVIL) 200 MG tablet Take 400 mg by mouth every 6 (six) hours as needed for moderate pain.    [provider]  lidocaine (LIDODERM) 5 % Place 1 patch onto the skin daily. Remove & Discard patch within 12 hours or as directed by MD Patient not taking: Reported on 05/15/2020 02/05/20   Laban Emperor, PA-C  meloxicam (MOBIC) 7.5 MG tablet Take 1 tablet (7.5 mg total) by mouth daily. Patient not taking: Reported on 05/15/2020 02/05/20 02/04/21  Laban Emperor, PA-C  omeprazole (PRILOSEC) 40 MG capsule Take 40 mg by mouth daily. 02/26/20   [provider]  predniSONE (DELTASONE) 20 MG tablet Take 2 tablets (40 mg total) by mouth daily. 05/18/20   Harvest Dark, MD  valsartan-hydrochlorothiazide (DIOVAN-HCT) 80-12.5 MG tablet Take 1 tablet by mouth daily.    [provider]    Allergies Patient has no known allergies.  Family History  Problem Relation Age of Onset  . Breast cancer Neg Hx     Social History Social History   Tobacco Use  . Smoking status: Current Every Day Smoker  Types: Cigarettes  . Smokeless tobacco: Never Used  Substance Use Topics  . Alcohol use: Not on file  . Drug use: Not on file    Review of Systems  Constitutional: Positive for subjective fevers. Eyes: No visual changes. ENT: No sore throat. Cardiovascular: Positive for chest tightness. Respiratory: Positive for cough, wheezing and shortness of breath. Gastrointestinal: No abdominal pain.  No nausea, no vomiting.  No diarrhea.  No constipation. Genitourinary: Negative for dysuria. Musculoskeletal: Negative for back pain. Skin:  Negative for rash. Neurological: Negative for headaches, focal weakness or numbness.   ____________________________________________   PHYSICAL EXAM:  VITAL SIGNS: ED Triage Vitals  Enc Vitals Group     BP --      Pulse Rate 05/20/20 0404 78     Resp 05/20/20 0404 (!) 22     Temp --      Temp Source 05/20/20 0404 Oral     SpO2 05/20/20 0404 99 %     Weight 05/20/20 0407 176 lb 5.9 oz (80 kg)     Height 05/20/20 0407 5\' 7"  (1.702 m)     Head Circumference --      Peak Flow --      Pain Score --      Pain Loc --      Pain Edu? --      Excl. in [place]? --     Constitutional: Alert and oriented. Well appearing and in mild acute distress. Eyes: Conjunctivae are normal. PERRL. EOMI. Head: Atraumatic. Nose: No congestion/rhinnorhea. Mouth/Throat: Mucous membranes are moist.  Oropharynx non-erythematous. Neck: No stridor.   Cardiovascular: Normal rate, regular rhythm. Grossly normal heart sounds.  Good peripheral circulation. Respiratory: Increased respiratory effort.  No retractions. Lungs diminished bilaterally. Gastrointestinal: Soft and nontender. No distention. No abdominal bruits. No CVA tenderness. Musculoskeletal: No lower extremity tenderness nor edema.  No joint effusions. Neurologic:  Normal speech and language. No gross focal neurologic deficits are appreciated. No gait instability. Skin:  Skin is warm, dry and intact. No rash noted. Psychiatric: Mood and affect are normal. Speech and behavior are normal.  ____________________________________________   LABS (all labs ordered are listed, but only abnormal results are displayed)  Labs Reviewed  CBC WITH DIFFERENTIAL/PLATELET - Abnormal; Notable for the following components:      Result Value   RBC 5.14 (*)    Hemoglobin 16.0 (*)    HCT 47.8 (*)    All other components within normal limits  BASIC METABOLIC PANEL - Abnormal; Notable for the following components:   Potassium 3.0 (*)    All other components within  normal limits  HIV ANTIBODY (ROUTINE TESTING W REFLEX)  TROPONIN I (HIGH SENSITIVITY)   ____________________________________________  EKG  ED ECG REPORT I, Dallon Dacosta J, the attending physician, personally viewed and interpreted this ECG.   Date: 05/20/2020  EKG Time: 0409  Rate: 90  Rhythm: normal EKG, normal sinus rhythm  Axis: Normal  Intervals:QtC 527  ST&T Change: Nonspecific  ____________________________________________  RADIOLOGY  ED MD interpretation: No acute cardiopulmonary process  Official radiology report(s): DG Chest Port 1 View  Result Date: 05/20/2020 CLINICAL DATA:  Shortness of breath EXAM: PORTABLE CHEST 1 VIEW COMPARISON:  CT 05/18/2020 FINDINGS: Chronically coarsened interstitial changes and bronchitic features are similar to priors with some mild basilar atelectasis with low volumes and central vascular crowding. No new focal consolidative opacity is seen. No convincing features of edema. No pneumothorax or effusion. The aorta is calcified. The remaining cardiomediastinal contours are unremarkable. No  acute osseous or soft tissue abnormality. Telemetry leads and nasal cannula overlie the chest. IMPRESSION: Chronically coarsened interstitial changes and bronchitic features, similar to priors. No acute cardiopulmonary abnormality. Electronically Signed   By: Lovena Le M.D.   On: 05/20/2020 04:35    ____________________________________________   PROCEDURES  Procedure(s) performed (including Critical Care):  .1-3 Lead EKG Interpretation Performed by: Paulette Blanch, MD Authorized by: Paulette Blanch, MD     Interpretation: normal     ECG rate:  88   ECG rate assessment: normal     Rhythm: sinus rhythm     Ectopy: none     Conduction: normal   Comments:     Patient placed on cardiac monitor to evaluate for arrhythmias   CRITICAL CARE Performed by: Paulette Blanch   Total critical care time: 30 minutes  Critical care time was exclusive of separately  billable procedures and treating other patients.  Critical care was necessary to treat or prevent imminent or life-threatening deterioration.  Critical care was time spent personally by me on the following activities: development of treatment plan with patient and/or surrogate as well as nursing, discussions with consultants, evaluation of patient's response to treatment, examination of patient, obtaining history from patient or surrogate, ordering and performing treatments and interventions, ordering and review of laboratory studies, ordering and review of radiographic studies, pulse oximetry and re-evaluation of patient's condition.   ____________________________________________   INITIAL IMPRESSION / ASSESSMENT AND PLAN / ED COURSE  As part of my medical decision making, I reviewed the following data within the [place] notes reviewed and incorporated, Labs reviewed, EKG interpreted, Old chart reviewed, Radiograph reviewed and Notes from prior ED visits     Sabrina Hudson was evaluated in Emergency Department on 05/20/2020 for the symptoms described in the history of present illness. She was evaluated in the context of the global COVID-19 pandemic, which necessitated consideration that the patient might be at risk for infection with the SARS-CoV-2 virus that causes COVID-19. Institutional protocols and algorithms that pertain to the evaluation of patients at risk for COVID-19 are in a state of rapid change based on information released by regulatory bodies including the CDC and federal and state organizations. These policies and algorithms were followed during the patient's care in the ED.    69 year old female with COPD who presents with exacerbation Differential includes, but is not limited to, viral syndrome, bronchitis including COPD exacerbation, pneumonia, reactive airway disease including asthma, CHF including exacerbation with or without  pulmonary/interstitial edema, pneumothorax, ACS, thoracic trauma, and pulmonary embolism.  I personally reviewed patient's ED visit from yesterday including her unremarkable CT scan.  Will repeat lab work, chest x-ray.  Administer second DuoNeb and reassess.   Clinical Course as of May 21 527  Mon May 20, 2020  0507 Wheezing improved.  Work of breathing remains.  Wet sounding cough.  Will administer Tussionex, replete potassium.  Discussed with hospitalist services for admission.  Patient had NEGATIVE COVID PCR 5/22.   [JS]    Clinical Course User Index [JS] Paulette Blanch, MD     ____________________________________________   FINAL CLINICAL IMPRESSION(S) / ED DIAGNOSES  Final diagnoses:  COPD exacerbation ([place])  Hypokalemia     ED Discharge Orders    None       Note:  This document was prepared using Dragon voice recognition software and may include unintentional dictation errors.   Paulette Blanch, MD 05/20/20 [phone]

## 2020-05-20 NOTE — ED Notes (Signed)
Attempted to call floor for pt's assignment. Per Aeronautical engineer is in another room. Ascom number provided and Rn to call back.

## 2020-05-20 NOTE — H&P (Signed)
History and Physical    Sabrina Hudson [id number] DOB: 1951/07/08 DOA: 05/20/2020  PCP: [facility]   Patient coming from: Home I have personally briefly reviewed patient's old medical records in [place]  Chief Complaint: Shortness of breath, wheezing  HPI: Sabrina Hudson is a 69 y.o. female with medical history significant for hypertension and COPD who presents to the emergency room for the second time in 24 hours with wheezing not responding adequately to home bronchodilator therapy.  On her first visit to the emergency room yesterday she was treated and discharged on azithromycin and prednisone.  She had a chest CT that ruled out PE.  She tested negative for Covid.  She returns today with continued symptoms.  She denies fever or chills and denies chest pain.  O2 sat was reported to be 90% with EMS.  She received 2 duo nebs in route.  ED Course: By arrival, O2 sat had improved to 92% on room air.  She continued to wheeze and additional DuoNeb treatments were administered.  Her blood work was unremarkable.  Chest x-ray showed no acute findings.  Admission requested due to failure to improve with outpatient treatment Review of Systems: As per HPI otherwise 10 point review of systems negative.    Past Medical History:  Diagnosis Date  . Anxiety   . GERD (gastroesophageal reflux disease)   . Hypertension     Past Surgical History:  Procedure Laterality Date  . ABDOMINAL HYSTERECTOMY    . TONSILLECTOMY       reports that she has been smoking cigarettes. She has never used smokeless tobacco. No history on file for alcohol and drug.  No Known Allergies  Family History  Problem Relation Age of Onset  . Breast cancer Neg Hx      Prior to Admission medications   Medication Sig Start Date End Date Taking? Authorizing Provider  acetaminophen (TYLENOL) 500 MG tablet Take 500 mg by mouth every 6 (six) hours as needed for moderate pain.    [provider]  azithromycin (ZITHROMAX Z-PAK) 250 MG tablet Take 2 tablets (500 mg) on  Day 1,  followed by 1 tablet (250 mg) once daily on Days 2 through 5. 05/18/20 05/23/20  Harvest Dark, MD  DULoxetine (CYMBALTA) 60 MG capsule Take 60 mg by mouth daily.    [provider]  fluticasone (FLONASE) 50 MCG/ACT nasal spray Place 1 spray into both nostrils daily as needed for allergies or rhinitis.    [provider]  HYDROcodone-acetaminophen (NORCO) 5-325 MG tablet Take 0.5 tablets by mouth every 6 (six) hours as needed for moderate pain. Patient taking differently: Take 0.5-1 tablets by mouth every 6 (six) hours as needed for moderate pain.  02/05/20   Laban Emperor, PA-C  ibuprofen (ADVIL) 200 MG tablet Take 400 mg by mouth every 6 (six) hours as needed for moderate pain.    [provider]  lidocaine (LIDODERM) 5 % Place 1 patch onto the skin daily. Remove & Discard patch within 12 hours or as directed by MD Patient not taking: Reported on 05/15/2020 02/05/20   Laban Emperor, PA-C  meloxicam (MOBIC) 7.5 MG tablet Take 1 tablet (7.5 mg total) by mouth daily. Patient not taking: Reported on 05/15/2020 02/05/20 02/04/21  Laban Emperor, PA-C  omeprazole (PRILOSEC) 40 MG capsule Take 40 mg by mouth daily. 02/26/20   [provider]  predniSONE (DELTASONE) 20 MG tablet Take 2 tablets (40 mg total) by mouth daily. 05/18/20  Harvest Dark, MD  valsartan-hydrochlorothiazide (DIOVAN-HCT) 80-12.5 MG tablet Take 1 tablet by mouth daily.    [provider]    Physical Exam: Vitals:   05/20/20 0410 05/20/20 0415 05/20/20 0430 05/20/20 0445  BP: (!) 166/82     Pulse:  75 89 86  Resp:  15 20 14   Temp: 98.5 F (36.9 C)     TempSrc: Oral     SpO2:  94% 95% 93%  Weight:      Height:         Vitals:   05/20/20 0410 05/20/20 0415 05/20/20 0430 05/20/20 0445  BP: (!) 166/82     Pulse:  75 89 86  Resp:  15 20 14   Temp: 98.5 F (36.9 C)     TempSrc: Oral      SpO2:  94% 95% 93%  Weight:      Height:        Constitutional: Alert and awake, oriented x3, mild conversational dyspnea eyes: PERLA, EOMI, irises appear normal, anicteric sclera,  ENMT: external ears and nose appear normal, hearing impaired  neck: neck appears normal, no masses, normal ROM, no thyromegaly, no JVD  CVS: S1-S2 clear, no murmur rubs or gallops,  , no carotid bruits, pedal pulses palpable, No LE edema Respiratory: Bilateral rhonchi. Respiratory effort increased. No accessory muscle use.  Abdomen: soft nontender, nondistended, normal bowel sounds, no hepatosplenomegaly, no hernias Musculoskeletal: : no cyanosis, clubbing , no contractures or atrophy Neuro: Cranial nerves II-XII intact, sensation, reflexes normal, strength Psych: judgement and insight appear normal, stable mood and affect,  Skin: no rashes or lesions or ulcers, no induration or nodules   Labs on Admission: I have personally reviewed following labs and imaging studies  CBC: Recent Labs  Lab 05/18/20 0918 05/20/20 0410  WBC 5.5 8.0  NEUTROABS  --  4.8  HGB 13.8 16.0*  HCT 41.3 47.8*  MCV 93.0 93.0  PLT 208 0000000   Basic Metabolic Panel: Recent Labs  Lab 05/18/20 0918 05/20/20 0410  NA 139 141  K 3.5 3.0*  CL 104 105  CO2 25 28  GLUCOSE 99 99  BUN 11 13  CREATININE 0.64 0.67  CALCIUM 9.0 9.1   GFR: Estimated Creatinine Clearance: 75.3 mL/min (by C-G formula based on SCr of 0.67 mg/dL). Liver Function Tests: Recent Labs  Lab 05/18/20 0918  AST 17  ALT 15  ALKPHOS 88  BILITOT 0.7  PROT 6.9  ALBUMIN 4.1   No results for input(s): LIPASE, AMYLASE in the last 168 hours. No results for input(s): AMMONIA in the last 168 hours. Coagulation Profile: No results for input(s): INR, PROTIME in the last 168 hours. Cardiac Enzymes: No results for input(s): CKTOTAL, CKMB, CKMBINDEX, TROPONINI in the last 168 hours. BNP (last 3 results) No results for input(s): PROBNP in the last 8760  hours. HbA1C: No results for input(s): HGBA1C in the last 72 hours. CBG: No results for input(s): GLUCAP in the last 168 hours. Lipid Profile: No results for input(s): CHOL, HDL, LDLCALC, TRIG, CHOLHDL, LDLDIRECT in the last 72 hours. Thyroid Function Tests: No results for input(s): TSH, T4TOTAL, FREET4, T3FREE, THYROIDAB in the last 72 hours. Anemia Panel: No results for input(s): VITAMINB12, FOLATE, FERRITIN, TIBC, IRON, RETICCTPCT in the last 72 hours. Urine analysis:    Component Value Date/Time   COLORURINE YELLOW (A) 02/05/2020 1257   APPEARANCEUR CLEAR (A) 02/05/2020 1257   APPEARANCEUR Hazy 02/26/2014 1833   LABSPEC 1.012 02/05/2020 1257   LABSPEC 1.019 02/26/2014  [place] 5.0 02/05/2020 [place] 02/05/2020 1257   GLUCOSEU Negative 02/26/2014 1833   HGBUR NEGATIVE 02/05/2020 1257   BILIRUBINUR NEGATIVE 02/05/2020 1257   BILIRUBINUR Negative 02/26/2014 1833   KETONESUR NEGATIVE 02/05/2020 1257   PROTEINUR NEGATIVE 02/05/2020 1257   NITRITE NEGATIVE 02/05/2020 1257   LEUKOCYTESUR TRACE (A) 02/05/2020 1257   LEUKOCYTESUR 3+ 02/26/2014 1833    Radiological Exams on Admission: CT Angio Chest PE W and/or Wo Contrast  Result Date: 05/18/2020 CLINICAL DATA:  History of COVID, shortness of breath, temperature EXAM: CT ANGIOGRAPHY CHEST WITH CONTRAST TECHNIQUE: Multidetector CT imaging of the chest was performed using the standard protocol during bolus administration of intravenous contrast. Multiplanar CT image reconstructions and MIPs were obtained to evaluate the vascular anatomy. CONTRAST:  6mL OMNIPAQUE IOHEXOL 350 MG/ML SOLN COMPARISON:  None. FINDINGS: Cardiovascular: Satisfactory opacification of the pulmonary arteries to the segmental level. No evidence of pulmonary embolism. Normal heart size. No pericardial effusion. Aortic atherosclerosis. Mediastinum/Nodes: No enlarged mediastinal, hilar, or axillary lymph nodes. Thyroid gland, trachea, and  esophagus demonstrate no significant findings. Lungs/Pleura: Lungs are clear. No pleural effusion or pneumothorax. Upper Abdomen: No acute abnormality. Musculoskeletal: No chest wall abnormality. No acute or significant osseous findings. Review of the MIP images confirms the above findings. IMPRESSION: 1. Negative examination for pulmonary embolism. 2. Aortic Atherosclerosis (ICD10-I70.0). Electronically Signed   By: Eddie Candle M.D.   On: 05/18/2020 11:14   DG Chest Port 1 View  Result Date: 05/20/2020 CLINICAL DATA:  Shortness of breath EXAM: PORTABLE CHEST 1 VIEW COMPARISON:  CT 05/18/2020 FINDINGS: Chronically coarsened interstitial changes and bronchitic features are similar to priors with some mild basilar atelectasis with low volumes and central vascular crowding. No new focal consolidative opacity is seen. No convincing features of edema. No pneumothorax or effusion. The aorta is calcified. The remaining cardiomediastinal contours are unremarkable. No acute osseous or soft tissue abnormality. Telemetry leads and nasal cannula overlie the chest. IMPRESSION: Chronically coarsened interstitial changes and bronchitic features, similar to priors. No acute cardiopulmonary abnormality. Electronically Signed   By: Lovena Le M.D.   On: 05/20/2020 04:35   DG Chest Portable 1 View  Result Date: 05/18/2020 CLINICAL DATA:  69 year old female with a history of smoking and bronchitis EXAM: PORTABLE CHEST 1 VIEW COMPARISON:  No comparison available FINDINGS: Cardiomediastinal silhouette within normal limits. Calcifications of the aortic arch. No pneumothorax.  No pleural effusion. Coarsened interstitial markings.  No confluent airspace disease. No displaced fracture IMPRESSION: Likely chronic lung changes without evidence of acute cardiopulmonary disease. Electronically Signed   By: Corrie Mckusick D.O.   On: 05/18/2020 09:42    EKG: Independently reviewed.   Assessment/Plan Principal Problem:   COPD with  acute exacerbation ([place]) -Second visit to the ER  with wheezing not responding to home bronchodilator treatment and prior treatment in the emergency room, with borderline hypoxemia -Scheduled and as needed bronchodilator therapy -IV steroids -Supplemental oxygen to keep sats over 92% -   HTN (hypertension) -Continue home meds valsartan HCTZ pending med rec      DVT prophylaxis: Lovenox  Code Status: full code  Family Communication:  none  Disposition Plan: Back to previous home environment Consults called: none  Status:obs    Athena Masse MD [facility]     05/20/2020, 5:26 AM

## 2020-05-20 NOTE — ED Triage Notes (Signed)
Pt with shob, md at bedside. Ems gave 125mg  solumedrol iv and one duoneb. Pt in no resp distress. HOH.

## 2020-05-20 NOTE — Progress Notes (Signed)
PROGRESS NOTE    Sabrina Hudson  JQG:[id number] DOB: 09/14/1951 DOA: 05/20/2020 PCP: [facility]  Brief Narrative:  Patient is a 69 year old female with history of COPD, essential hypertension, who present to the emergency room with a worsening short of breath and wheezing.  She had a Covid in February 2021, since recovering from it, patient has a worsening short of breath.  She can barely walk a few steps.  Since that time, she had quit smoking. Upon arrival in the emergency room, she did not have any hypoxemia, but had a severe bronchospasm.  She was placed on IV steroids.    Assessment & Plan:   Principal Problem:   COPD with acute exacerbation ([place]) Active Problems:   HTN (hypertension)   COPD exacerbation ([place])  #1.  COPD exacerbation. Patient still has significant bronchospasm today.  We will keep patient for 24 hours, continue IV steroids.  Currently she does not need any oxygen.  She probably will be discharged home tomorrow with oral steroids.  2.  Essential hypertension.  Continue home medicines.  3.  Hypokalemia. Supplement IV.  Recheck potassium and magnesium level tomorrow.   DVT prophylaxis: Lovenox Code Status: Full Family Communication: Non Disposition Plan:  . Patient came from: Home            . Anticipated d/c place: Home . Barriers to d/c OR conditions which need to be met to effect a safe d/c:   Consultants:   None  Procedures: None Antimicrobials:None Subjective: Patient still has some wheezing today, cough, nonproductive.  No chest pain.  No fever or chills.  Denies any abdominal pain or nausea vomiting.  No diarrhea constipation.  No dysuria hematuria.  Objective: Vitals:   05/20/20 0715 05/20/20 0800 05/20/20 0830 05/20/20 1045  BP:   110/83 135/77  Pulse: 78 72 72 72  Resp: _0 Temp:      TempSrc:      SpO2: (!) 89% 92% 98% 99%  Weight:      Height:       No intake or output data in the 24 hours ending 05/20/20  1113 Filed Weights   05/20/20 0407  Weight: 80 kg    Examination:  General exam: Appears calm and comfortable  Respiratory system: Significant wheezes bilaterally. Respiratory effort normal. Cardiovascular system: S1 & S2 heard, RRR. No JVD, murmurs, rubs, gallops or clicks. No pedal edema. Gastrointestinal system: Abdomen is nondistended, soft and nontender. No organomegaly or masses felt. Normal bowel sounds heard. Central nervous system: Alert and oriented. No focal neurological deficits. Extremities: Symmetric  Skin: No rashes, lesions or ulcers Psychiatry: Judgement and insight appear normal. Mood & affect appropriate.     Data Reviewed: I have personally reviewed following labs and imaging studies  CBC: Recent Labs  Lab 05/18/20 0918 05/20/20 0410  WBC 5.5 8.0  NEUTROABS  --  4.8  HGB 13.8 16.0*  HCT 41.3 47.8*  MCV 93.0 93.0  PLT 208 197   Basic Metabolic Panel: Recent Labs  Lab 05/18/20 0918 05/20/20 0410  NA 139 141  K 3.5 3.0*  CL 104 105  CO2 25 28  GLUCOSE 99 99  BUN 11 13  CREATININE 0.64 0.67  CALCIUM 9.0 9.1   GFR: Estimated Creatinine Clearance: 75.3 mL/min (by C-G formula based on SCr of 0.67 mg/dL). Liver Function Tests: Recent Labs  Lab 05/18/20 0918  AST 17  ALT 15  ALKPHOS 88  BILITOT 0.7  PROT 6.9  ALBUMIN 4.1   No results for input(s): LIPASE, AMYLASE in the last 168 hours. No results for input(s): AMMONIA in the last 168 hours. Coagulation Profile: No results for input(s): INR, PROTIME in the last 168 hours. Cardiac Enzymes: No results for input(s): CKTOTAL, CKMB, CKMBINDEX, TROPONINI in the last 168 hours. BNP (last 3 results) No results for input(s): PROBNP in the last 8760 hours. HbA1C: No results for input(s): HGBA1C in the last 72 hours. CBG: No results for input(s): GLUCAP in the last 168 hours. Lipid Profile: No results for input(s): CHOL, HDL, LDLCALC, TRIG, CHOLHDL, LDLDIRECT in the last 72 hours. Thyroid  Function Tests: No results for input(s): TSH, T4TOTAL, FREET4, T3FREE, THYROIDAB in the last 72 hours. Anemia Panel: No results for input(s): VITAMINB12, FOLATE, FERRITIN, TIBC, IRON, RETICCTPCT in the last 72 hours. Sepsis Labs: Recent Labs  Lab 05/18/20 0918  PROCALCITON <0.10  LATICACIDVEN 0.7    Recent Results (from the past 240 hour(s))  SARS Coronavirus 2 by RT PCR (hospital order, performed in [facility] hospital lab) Nasopharyngeal Nasopharyngeal Swab     Status: None   Collection Time: 05/18/20  9:18 AM   Specimen: Nasopharyngeal Swab  Result Value Ref Range Status   SARS Coronavirus 2 NEGATIVE NEGATIVE Final    Comment: (NOTE) SARS-CoV-2 target nucleic acids are NOT DETECTED. The SARS-CoV-2 RNA is generally detectable in upper and lower respiratory specimens during the acute phase of infection. The lowest concentration of SARS-CoV-2 viral copies this assay can detect is 250 copies / mL. A negative result does not preclude SARS-CoV-2 infection and should not be used as the sole basis for treatment or other patient management decisions.  A negative result may occur with improper specimen collection / handling, submission of specimen other than nasopharyngeal swab, presence of viral mutation(s) within the areas targeted by this assay, and inadequate number of viral copies (<250 copies / mL). A negative result must be combined with clinical observations, patient history, and epidemiological information. Fact Sheet for Patients:   StrictlyIdeas.no Fact Sheet for Healthcare Providers: BankingDealers.co.za This test is not yet approved or cleared  by the [place] FDA and has been authorized for detection and/or diagnosis of SARS-CoV-2 by FDA under an Emergency Use Authorization (EUA).  This EUA will remain in effect (meaning this test can be used) for the duration of the COVID-19 declaration under Section 564(b)(1) of the  Act, 21 U.S.C. section 360bbb-3(b)(1), unless the authorization is terminated or revoked sooner. Performed at [facility], [address] [place], [place] [address]          Radiology Studies: DG Chest [place] 1 View  Result Date: 05/20/2020 CLINICAL DATA:  Shortness of breath EXAM: PORTABLE CHEST 1 VIEW COMPARISON:  CT 05/18/2020 FINDINGS: Chronically coarsened interstitial changes and bronchitic features are similar to priors with some mild basilar atelectasis with low volumes and central vascular crowding. No new focal consolidative opacity is seen. No convincing features of edema. No pneumothorax or effusion. The aorta is calcified. The remaining cardiomediastinal contours are unremarkable. No acute osseous or soft tissue abnormality. Telemetry leads and nasal cannula overlie the chest. IMPRESSION: Chronically coarsened interstitial changes and bronchitic features, similar to priors. No acute cardiopulmonary abnormality. Electronically Signed   By: Lovena Le M.D.   On: 05/20/2020 04:35        Scheduled Meds: . enoxaparin (LOVENOX) injection  40 mg Subcutaneous Q24H  . ipratropium-albuterol  3 mL Nebulization Q6H  . methylPREDNISolone (SOLU-MEDROL) injection  40 mg Intravenous  Q6H   Followed by  . [START ON 05/21/2020] predniSONE  40 mg Oral Q breakfast   Continuous Infusions: . potassium chloride       LOS: 0 days    Time spent:     Sharen Hones, MD [facility]   To contact the attending provider between 7A-7P or the covering provider during after hours 7P-7A, please log into the web site [email or web address] and access using universal [place] password for that web site. If you do not have the password, please call the hospital operator.  05/20/2020, 11:13 AM

## 2020-05-20 NOTE — Pre-Procedure Instructions (Signed)
Pre-Admit Testing Provider Notification Note  Provider Notified: Dr. Rudene Christians  Notification Mode: Call to Dr. Rudene Christians surgical coordinator, Lamar Blinks.  Reason: Patient was scheduled for a PAT interview call today. She is currently admitted for COPD exacerbation.  Response: Voice message left on Keirstin's voicemail.  Additional Information:  Signed: Beulah Gandy, RN

## 2020-05-20 NOTE — ED Notes (Signed)
Report to samantha, rn.  

## 2020-05-20 NOTE — Evaluation (Signed)
Physical Therapy Evaluation Patient Details Name: Sabrina Hudson MRN: [id number] DOB: [birth year] Today's Date: 05/20/2020   History of Present Illness  Sabrina Hudson is a 69 y.o. female with medical history significant for hypertension and COPD who presents to the emergency room for the second time in 24 hours with wheezing not responding adequately to home bronchodilator therapy. She had a Covid in February 2021, since recovering from it, patient has a worsening short of breath.  Clinical Impression  Pt is a pleasant 69 year old female who was admitted for COPD exacerbation. Pt performs bed mobility with independence, transfers with supervision, and ambulation with cga and RW. Pt demonstrates all bed mobility/transfers/ambulation at baseline level. Pt does not require any further PT needs at this time. Pt will be dc in house and does not require follow up. RN aware. Will dc current orders. Recommend RW use. All mobility performed on RA with sats WNL.     Follow Up Recommendations No PT follow up    Equipment Recommendations  Rolling walker with 5" wheels    Recommendations for Other Services       Precautions / Restrictions Precautions Precautions: None Restrictions Weight Bearing Restrictions: No      Mobility  Bed Mobility Overal bed mobility: Modified Independent             General bed mobility comments: somewhat impulsive and needs cues to wait for therapist. Once seated at EOB, able to sit with upright posture.  Transfers Overall transfer level: Needs assistance Equipment used: Rolling walker (2 wheeled) Transfers: Sit to/from Stand Sit to Stand: Supervision         General transfer comment: safe technique with upright posture. RW used  Ambulation/Gait Ambulation/Gait assistance: Min Gaffer (Feet): 200 Feet Assistive device: Rolling walker (2 wheeled) Gait Pattern/deviations: Step-through pattern     General Gait Details:  ambulated around RN station with good speed and symmetrical gait pattern. Cues for upright posture. Slightly SOB symptoms noted. O2 sats at 92% on room air.  Stairs            Wheelchair Mobility    Modified Rankin (Stroke Patients Only)       Balance Overall balance assessment: Mild deficits observed, not formally tested                                           Pertinent Vitals/Pain Pain Assessment: No/denies pain    Home Living Family/patient expects to be discharged to:: Private residence Living Arrangements: Other relatives(sister) Available Help at Discharge: Family;Available 24 hours/day Type of Home: House Home Access: Stairs to enter   [employer] of Steps: 1 Home Layout: Two level;Able to live on main level with bedroom/bathroom Home Equipment: Kasandra Knudsen - single point;Shower seat;Wheelchair - manual      Prior Function Level of Independence: Independent with assistive device(s)         Comments: MOD I c SPC for mobility. Requires assist for LBD - plan for R THA next month. Reports no falls recently     Hand Dominance   Dominant Hand: Right    Extremity/Trunk Assessment   Upper Extremity Assessment Upper Extremity Assessment: Overall WFL for tasks assessed    Lower Extremity Assessment Lower Extremity Assessment: RLE deficits/detail(grossly 4/5; L LE grossly 5/5) RLE Deficits / Details: Decreased AROM (pt has R THA scheduled for next month)  Communication   Communication: HOH  Cognition Arousal/Alertness: Awake/alert Behavior During Therapy: WFL for tasks assessed/performed Overall Cognitive Status: Within Functional Limits for tasks assessed                                        General Comments      Exercises Other Exercises Other Exercises: Pt and caregiver educated re: OT role, DME recs, d/c recs, energy conservation (handout provided), falls prevention, RW technique Other Exercises:  Toileting, face washing, standing/sitting balance/tolerance, bed mobility, sup<>sit, sit<>stand, LBD   Assessment/Plan    PT Assessment Patent does not need any further PT services  PT Problem List         PT Treatment Interventions      PT Goals (Current goals can be found in the Care Plan section)  Acute Rehab PT Goals Patient Stated Goal: To return to PLOF PT Goal Formulation: All assessment and education complete, DC therapy Time For Goal Achievement: 05/20/20 Potential to Achieve Goals: Good    Frequency     Barriers to discharge        Co-evaluation               AM-PAC PT "6 Clicks" Mobility  Outcome Measure Help needed turning from your back to your side while in a flat bed without using bedrails?: None Help needed moving from lying on your back to sitting on the side of a flat bed without using bedrails?: None Help needed moving to and from a bed to a chair (including a wheelchair)?: None Help needed standing up from a chair using your arms (e.g., wheelchair or bedside chair)?: None Help needed to walk in hospital room?: None Help needed climbing 3-5 steps with a railing? : None 6 Click Score: 24    End of Session Equipment Utilized During Treatment: Gait belt Activity Tolerance: Patient tolerated treatment well Patient left: in chair;with chair alarm set Nurse Communication: Mobility status PT Visit Diagnosis: Muscle weakness (generalized) (M62.81);Pain Pain - Right/Left: Right Pain - part of body: Hip    Time: 1437-1500 PT Time Calculation (min) (ACUTE ONLY): 23 min   Charges:   PT Evaluation $PT Eval Low Complexity: 1 Low PT Treatments $Gait Training: 8-22 mins        Greggory Stallion, PT, DPT [phone]   Beverley Sherrard 05/20/2020, 3:40 PM

## 2020-05-20 NOTE — Evaluation (Signed)
Occupational Therapy Evaluation Patient Details Name: Sabrina Hudson MRN: [id number] DOB: 21-Jun-1951 Today's Date: 05/20/2020    History of Present Illness Sabrina Hudson is a 69 y.o. female with medical history significant for hypertension and COPD who presents to the emergency room for the second time in 24 hours with wheezing not responding adequately to home bronchodilator therapy. She had a Covid in February 2021, since recovering from it, patient has a worsening short of breath.   Clinical Impression   [name] was seen for OT evaluation this date. Prior to hospital admission, pt was MOD I c SPC for mobility occasionally requiring use of w/c when SOB. Pt lives c sister in two level home and lives on main level, sister is available 24/7. PTA pt required assist for LBD 2/2 decreased R hip AROM - currently has R THA scheduled for next month. Pt presents to acute OT at baseline for seated ADL tasks and grossly SUPERVISION only for toileting and standing ADL tasks. Pt and caregiver instructed in energy conservation (handout provided), falls prevention, and safe RW technique. Pt has no further identified skilled acute OT needs. Recommend no OT follow up.     Follow Up Recommendations  No OT follow up    Equipment Recommendations  None recommended by OT    Recommendations for Other Services       Precautions / Restrictions Precautions Precautions: None      Mobility Bed Mobility Overal bed mobility: Modified Independent             General bed mobility comments: Sup<>sit c HOB elevated ~30*  Transfers Overall transfer level: Needs assistance Equipment used: Rolling walker (2 wheeled) Transfers: Sit to/from Stand Sit to Stand: Supervision         General transfer comment: SUPERVISION + RW sit<>stand at EOB and standard commode    Balance Overall balance assessment: Mild deficits observed, not formally tested                                          ADL either performed or assessed with clinical judgement   ADL Overall ADL's : Needs assistance/impaired                                       General ADL Comments: MOD A don B socks seated EOB. SUPERVISION + RW standing grooming sink side and toileting at commode     Vision         Perception     Praxis      Pertinent Vitals/Pain Pain Assessment: No/denies pain     Hand Dominance Right   Extremity/Trunk Assessment Upper Extremity Assessment Upper Extremity Assessment: Overall WFL for tasks assessed   Lower Extremity Assessment Lower Extremity Assessment: RLE deficits/detail RLE Deficits / Details: Decreased AROM (pt has R THA scheduled for next month)       Communication Communication Communication: HOH   Cognition Arousal/Alertness: Awake/alert Behavior During Therapy: WFL for tasks assessed/performed Overall Cognitive Status: Within Functional Limits for tasks assessed                                     General Comments       Exercises Exercises: Other exercises  Other Exercises Other Exercises: Pt and caregiver educated re: OT role, DME recs, d/c recs, energy conservation (handout provided), falls prevention, RW technique Other Exercises: Toileting, face washing, standing/sitting balance/tolerance, bed mobility, sup<>sit, sit<>stand, LBD   Shoulder Instructions      Home Living Family/patient expects to be discharged to:: Private residence Living Arrangements: Other relatives(Sister) Available Help at Discharge: Family;Available 24 hours/day Type of Home: House Home Access: Stairs to enter [employer] of Steps: 1   Home Layout: Two level;Able to live on main level with bedroom/bathroom     Bathroom Shower/Tub: Occupational psychologist: Handicapped height     Home Equipment: [place] - single point;Shower seat;Wheelchair - manual          Prior Functioning/Environment Level of  Independence: Independent with assistive device(s)        Comments: MOD I c SPC for mobility. Requires assist for LBD - plan for R THA next month         OT Problem List: Decreased range of motion;Decreased activity tolerance      OT Treatment/Interventions:      OT Goals(Current goals can be found in the care plan section) Acute Rehab OT Goals Patient Stated Goal: To return to PLOF OT Goal Formulation: With patient/family Time For Goal Achievement: 06/03/20 Potential to Achieve Goals: Good  OT Frequency:     Barriers to D/C:            Co-evaluation              AM-PAC OT "6 Clicks" Daily Activity     Outcome Measure Help from another person eating meals?: None Help from another person taking care of personal grooming?: None Help from another person toileting, which includes using toliet, bedpan, or urinal?: A Little Help from another person bathing (including washing, rinsing, drying)?: A Little Help from another person to put on and taking off regular upper body clothing?: None Help from another person to put on and taking off regular lower body clothing?: A Little 6 Click Score: 21   End of Session Equipment Utilized During Treatment: Rolling walker  Activity Tolerance: Patient tolerated treatment well Patient left: in bed;with call bell/phone within reach;with family/visitor present;Other (comment)(PT in room at end of session)  OT Visit Diagnosis: Unsteadiness on feet (R26.81);Other abnormalities of gait and mobility (R26.89)                Time: [id number] OT Time Calculation (min): 17 min Charges:  OT General Charges $OT Visit: 1 Visit OT Evaluation $OT Eval Moderate Complexity: 1 Mod OT Treatments $Self Care/Home Management : 8-22 mins  Dessie Coma, M.S. OTR/L  05/20/20, 3:26 PM

## 2020-05-20 NOTE — ED Notes (Signed)
Pt given breakfast tray

## 2020-05-21 ENCOUNTER — Inpatient Hospital Stay: Payer: Medicare Other

## 2020-05-21 DIAGNOSIS — R0602 Shortness of breath: Secondary | ICD-10-CM | POA: Diagnosis not present

## 2020-05-21 DIAGNOSIS — Z79891 Long term (current) use of opiate analgesic: Secondary | ICD-10-CM | POA: Diagnosis not present

## 2020-05-21 DIAGNOSIS — Z8616 Personal history of COVID-19: Secondary | ICD-10-CM | POA: Diagnosis not present

## 2020-05-21 DIAGNOSIS — F419 Anxiety disorder, unspecified: Secondary | ICD-10-CM | POA: Diagnosis present

## 2020-05-21 DIAGNOSIS — J441 Chronic obstructive pulmonary disease with (acute) exacerbation: Secondary | ICD-10-CM | POA: Diagnosis present

## 2020-05-21 DIAGNOSIS — K219 Gastro-esophageal reflux disease without esophagitis: Secondary | ICD-10-CM | POA: Diagnosis present

## 2020-05-21 DIAGNOSIS — E876 Hypokalemia: Secondary | ICD-10-CM | POA: Diagnosis present

## 2020-05-21 DIAGNOSIS — I4891 Unspecified atrial fibrillation: Secondary | ICD-10-CM | POA: Diagnosis not present

## 2020-05-21 DIAGNOSIS — F17200 Nicotine dependence, unspecified, uncomplicated: Secondary | ICD-10-CM | POA: Diagnosis not present

## 2020-05-21 DIAGNOSIS — F17210 Nicotine dependence, cigarettes, uncomplicated: Secondary | ICD-10-CM | POA: Diagnosis present

## 2020-05-21 DIAGNOSIS — R062 Wheezing: Secondary | ICD-10-CM | POA: Diagnosis not present

## 2020-05-21 DIAGNOSIS — M25551 Pain in right hip: Secondary | ICD-10-CM | POA: Diagnosis present

## 2020-05-21 DIAGNOSIS — Z20822 Contact with and (suspected) exposure to covid-19: Secondary | ICD-10-CM | POA: Diagnosis present

## 2020-05-21 DIAGNOSIS — Z79899 Other long term (current) drug therapy: Secondary | ICD-10-CM | POA: Diagnosis not present

## 2020-05-21 DIAGNOSIS — I480 Paroxysmal atrial fibrillation: Secondary | ICD-10-CM | POA: Diagnosis present

## 2020-05-21 DIAGNOSIS — I10 Essential (primary) hypertension: Secondary | ICD-10-CM | POA: Diagnosis present

## 2020-05-21 LAB — TROPONIN I (HIGH SENSITIVITY): Troponin I (High Sensitivity): 8 ng/L (ref <18)

## 2020-05-21 LAB — MAGNESIUM: Magnesium: 2.2 mg/dL (ref 1.7–2.4)

## 2020-05-21 LAB — BASIC METABOLIC PANEL
Anion gap: 6 (ref 5–15)
BUN: 18 mg/dL (ref 8–23)
CO2: 28 mmol/L (ref 22–32)
Calcium: 9.4 mg/dL (ref 8.9–10.3)
Chloride: 107 mmol/L (ref 98–111)
Creatinine, Ser: 0.71 mg/dL (ref 0.44–1.00)
GFR calc Af Amer: >60 mL/min (ref >60)
GFR calc non Af Amer: >60 mL/min (ref >60)
Glucose, Bld: 147 mg/dL — ABNORMAL HIGH (ref 70–99)
Potassium: 4.7 mmol/L (ref 3.5–5.1)
Sodium: 141 mmol/L (ref 135–145)

## 2020-05-21 LAB — BRAIN NATRIURETIC PEPTIDE: B Natriuretic Peptide: 141 pg/mL — ABNORMAL HIGH (ref 0.0–100.0)

## 2020-05-21 LAB — TSH: TSH: 0.763 u[IU]/mL (ref 0.350–4.500)

## 2020-05-21 LAB — FIBRIN DERIVATIVES D-DIMER (ARMC ONLY): Fibrin derivatives D-dimer (ARMC): 470.9 ng/mL (FEU) (ref 0.00–499.00)

## 2020-05-21 LAB — HIV ANTIBODY (ROUTINE TESTING W REFLEX): HIV Screen 4th Generation wRfx: NONREACTIVE

## 2020-05-21 MED ORDER — HYDROCHLOROTHIAZIDE 12.5 MG PO CAPS
12.5000 mg | ORAL_CAPSULE | Freq: Every day | ORAL | Status: DC
  Administered 2020-05-21 – 2020-05-23 (×3): 12.5 mg via ORAL
  Filled 2020-05-21 (×3): qty 1

## 2020-05-21 MED ORDER — ASPIRIN 325 MG PO TABS
325.0000 mg | ORAL_TABLET | Freq: Every day | ORAL | Status: DC
  Filled 2020-05-21 (×2): qty 1

## 2020-05-21 MED ORDER — FLUTICASONE PROPIONATE 50 MCG/ACT NA SUSP
1.0000 | Freq: Every day | NASAL | Status: DC | PRN
  Filled 2020-05-21: qty 16

## 2020-05-21 MED ORDER — METHYLPREDNISOLONE SODIUM SUCC 40 MG IJ SOLR
40.0000 mg | INTRAMUSCULAR | Status: DC
  Administered 2020-05-21 – 2020-05-23 (×3): 40 mg via INTRAVENOUS
  Filled 2020-05-21 (×3): qty 1

## 2020-05-21 MED ORDER — DILTIAZEM HCL 25 MG/5ML IV SOLN
20.0000 mg | Freq: Once | INTRAVENOUS | Status: AC
  Administered 2020-05-21: 20 mg via INTRAVENOUS
  Filled 2020-05-21 (×2): qty 5

## 2020-05-21 MED ORDER — IRBESARTAN 150 MG PO TABS
75.0000 mg | ORAL_TABLET | Freq: Every day | ORAL | Status: DC
  Administered 2020-05-21 – 2020-05-23 (×3): 75 mg via ORAL
  Filled 2020-05-21 (×3): qty 1

## 2020-05-21 MED ORDER — VALSARTAN-HYDROCHLOROTHIAZIDE 80-12.5 MG PO TABS: 1.0000 | ORAL_TABLET | Freq: Every day | ORAL | Status: DC

## 2020-05-21 MED ORDER — METOPROLOL TARTRATE 5 MG/5ML IV SOLN
10.0000 mg | Freq: Once | INTRAVENOUS | Status: AC
  Administered 2020-05-21: 10 mg via INTRAVENOUS
  Filled 2020-05-21: qty 10

## 2020-05-21 MED ORDER — MELOXICAM 7.5 MG PO TABS
7.5000 mg | ORAL_TABLET | Freq: Every day | ORAL | Status: DC
  Administered 2020-05-21 – 2020-05-23 (×3): 7.5 mg via ORAL
  Filled 2020-05-21 (×3): qty 1

## 2020-05-21 MED ORDER — METOPROLOL TARTRATE 25 MG PO TABS: 25.0000 mg | ORAL_TABLET | Freq: Two times a day (BID) | ORAL | Status: DC

## 2020-05-21 MED ORDER — METOPROLOL TARTRATE 5 MG/5ML IV SOLN
5.0000 mg | Freq: Once | INTRAVENOUS | Status: AC
  Administered 2020-05-21: 5 mg via INTRAVENOUS
  Filled 2020-05-21: qty 5

## 2020-05-21 MED ORDER — PANTOPRAZOLE SODIUM 40 MG PO TBEC
40.0000 mg | DELAYED_RELEASE_TABLET | Freq: Every day | ORAL | Status: DC
  Administered 2020-05-21 – 2020-05-23 (×3): 40 mg via ORAL
  Filled 2020-05-21 (×3): qty 1

## 2020-05-21 MED ORDER — HYDROCODONE-ACETAMINOPHEN 5-325 MG PO TABS: 0.5000 | ORAL_TABLET | Freq: Four times a day (QID) | ORAL | Status: DC | PRN

## 2020-05-21 MED ORDER — IPRATROPIUM-ALBUTEROL 0.5-2.5 (3) MG/3ML IN SOLN
3.0000 mL | Freq: Four times a day (QID) | RESPIRATORY_TRACT | Status: DC
  Administered 2020-05-21: 3 mL via RESPIRATORY_TRACT
  Filled 2020-05-21: qty 3

## 2020-05-21 MED ORDER — DULOXETINE HCL 30 MG PO CPEP
60.0000 mg | ORAL_CAPSULE | Freq: Every day | ORAL | Status: DC
  Administered 2020-05-21 – 2020-05-23 (×3): 60 mg via ORAL
  Filled 2020-05-21 (×3): qty 2

## 2020-05-21 MED ORDER — LEVALBUTEROL HCL 0.63 MG/3ML IN NEBU
0.6300 mg | INHALATION_SOLUTION | Freq: Four times a day (QID) | RESPIRATORY_TRACT | Status: DC | PRN
  Filled 2020-05-21: qty 3

## 2020-05-21 MED ORDER — APIXABAN 5 MG PO TABS
5.0000 mg | ORAL_TABLET | Freq: Two times a day (BID) | ORAL | Status: DC
  Administered 2020-05-22 – 2020-05-23 (×4): 5 mg via ORAL
  Filled 2020-05-21 (×4): qty 1

## 2020-05-21 NOTE — Progress Notes (Signed)
Nutrition Brief Note  RD working remotely.  RD consulted for assessment of nutritional needs/ status per COPD GOLD protocol.   Wt Readings from Last 15 Encounters:  05/20/20 80 kg  05/18/20 80.7 kg  02/05/20 81.6 kg   Sabrina Hudson is a 69 y.o. female with medical history significant for hypertension and COPD who presents to the emergency room for the second time in 24 hours with wheezing not responding adequately to home bronchodilator therapy.  Pt admitted with COPD exacerbation.  Attempted to speak with pt via phone x 2, however, call with disconnected prior to RD was able to speak with pt.   Reviewed wt hx; pt wt has been stable over the past year per documented wt hx and Care Everywhere.   Per MD notes, possible d/c home tomorrow (05/22/20).   Current diet order is Heart Healthy, patient is consuming approximately 100% of meals at this time. Labs and medications reviewed.   No nutrition interventions warranted at this time. If nutrition issues arise, please consult RD.   Loistine Chance, RD, LDN, [place] Registered Dietitian II Certified Diabetes Care and Education Specialist Please refer to [facility] for RD and/or RD on-call/weekend/after hours pager

## 2020-05-21 NOTE — Progress Notes (Addendum)
PROGRESS NOTE    Sabrina Hudson  GDJ:[id number] DOB: [birth year] DOA: 05/20/2020 PCP: [place]   Chief complaint.  Shortness of breath and wheezing. Brief Narrative:  Patient is a 69 year old female with history of COPD, essential hypertension, who present to the emergency room with a worsening short of breath and wheezing.  She had a Covid in February 2021, since recovering from it, patient has a worsening short of breath.  She can barely walk a few steps.  Since that time, she had quit smoking. Upon arrival in the emergency room, she did not have any hypoxemia, but had a severe bronchospasm.  She was placed on IV steroids.    Assessment & Plan:   Principal Problem:   COPD with acute exacerbation ([place]) Active Problems:   HTN (hypertension)   COPD exacerbation ([place])  #1.  COPD exacerbation. Patient still has significant bronchospasm.  I will change back steroid to IV every 24 hours.  Change to inpatient status.  She still has not needed oxygen. Planning going home tomorrow.  A walker and a nebulizer prescribed.  #2. Essential hypertension. Continue home medicines.  3.  Hypokalemia. Resolved.  DVT prophylaxis: Lovenox Code Status: Full Family Communication: Non Disposition Plan:   Patient came from: Home                                                                                                                           Anticipated d/c place: Home  Barriers to d/c OR conditions which need to be met to effect a safe d/c:   Consultants:   None  Procedures: None Antimicrobials:None   Subjective: Patient still feels short of breath and wheezing at rest.  But she was able to walk to the bathroom.  She has a cough, nonproductive.  She did not have any fever or chills.  No nausea vomiting or diarrhea.  Objective: Vitals:   05/21/20 0221 05/21/20 0745 05/21/20 0805 05/21/20 0808  BP:   (!) 125/57 126/60  Pulse:   88   Resp:   20   Temp:   98.2  F (36.8 C)   TempSrc:   Oral   SpO2: 93% 93%    Weight:      Height:       No intake or output data in the 24 hours ending 05/21/20 0908 Filed Weights   05/20/20 0407  Weight: 80 kg    Examination:  General exam: Appears calm and comfortable  Respiratory system: Diffuse wheezing with decreased breathing sounds. Respiratory effort normal. Cardiovascular system: S1 & S2 heard, RRR. No JVD, murmurs, rubs, gallops or clicks. No pedal edema. Gastrointestinal system: Abdomen is nondistended, soft and nontender. No organomegaly or masses felt. Normal bowel sounds heard. Central nervous system: Alert and oriented. No focal neurological deficits. Extremities: Symmetric  Skin: No rashes, lesions or ulcers Psychiatry: Judgement and insight appear normal. Mood & affect appropriate.     Data Reviewed: I have personally reviewed following  labs and imaging studies  CBC: Recent Labs  Lab 05/18/20 0918 05/20/20 0410  WBC 5.5 8.0  NEUTROABS  --  4.8  HGB 13.8 16.0*  HCT 41.3 47.8*  MCV 93.0 93.0  PLT 208 295   Basic Metabolic Panel: Recent Labs  Lab 05/18/20 0918 05/20/20 0410 05/21/20 0528  NA 139 141 141  K 3.5 3.0* 4.7  CL 104 105 107  CO2 _0 GLUCOSE 99 99 147*  BUN _1 CREATININE 0.64 0.67 0.71  CALCIUM 9.0 9.1 9.4  MG  --   --  2.2   GFR: Estimated Creatinine Clearance: 75.3 mL/min (by C-G formula based on SCr of 0.71 mg/dL). Liver Function Tests: Recent Labs  Lab 05/18/20 0918  AST 17  ALT 15  ALKPHOS 88  BILITOT 0.7  PROT 6.9  ALBUMIN 4.1   No results for input(s): LIPASE, AMYLASE in the last 168 hours. No results for input(s): AMMONIA in the last 168 hours. Coagulation Profile: No results for input(s): INR, PROTIME in the last 168 hours. Cardiac Enzymes: No results for input(s): CKTOTAL, CKMB, CKMBINDEX, TROPONINI in the last 168 hours. BNP (last 3 results) No results for input(s): PROBNP in the last 8760 hours. HbA1C: No results for  input(s): HGBA1C in the last 72 hours. CBG: No results for input(s): GLUCAP in the last 168 hours. Lipid Profile: No results for input(s): CHOL, HDL, LDLCALC, TRIG, CHOLHDL, LDLDIRECT in the last 72 hours. Thyroid Function Tests: No results for input(s): TSH, T4TOTAL, FREET4, T3FREE, THYROIDAB in the last 72 hours. Anemia Panel: No results for input(s): VITAMINB12, FOLATE, FERRITIN, TIBC, IRON, RETICCTPCT in the last 72 hours. Sepsis Labs: Recent Labs  Lab 05/18/20 0918  PROCALCITON <0.10  LATICACIDVEN 0.7    Recent Results (from the past 240 hour(s))  SARS Coronavirus 2 by RT PCR (hospital order, performed in [facility] hospital lab) Nasopharyngeal Nasopharyngeal Swab     Status: None   Collection Time: 05/18/20  9:18 AM   Specimen: Nasopharyngeal Swab  Result Value Ref Range Status   SARS Coronavirus 2 NEGATIVE NEGATIVE Final    Comment: (NOTE) SARS-CoV-2 target nucleic acids are NOT DETECTED. The SARS-CoV-2 RNA is generally detectable in upper and lower respiratory specimens during the acute phase of infection. The lowest concentration of SARS-CoV-2 viral copies this assay can detect is 250 copies / mL. A negative result does not preclude SARS-CoV-2 infection and should not be used as the sole basis for treatment or other patient management decisions.  A negative result may occur with improper specimen collection / handling, submission of specimen other than nasopharyngeal swab, presence of viral mutation(s) within the areas targeted by this assay, and inadequate number of viral copies (<250 copies / mL). A negative result must be combined with clinical observations, patient history, and epidemiological information. Fact Sheet for Patients:   StrictlyIdeas.no Fact Sheet for Healthcare Providers: BankingDealers.co.za This test is not yet approved or cleared  by the [place] FDA and has been authorized for detection and/or  diagnosis of SARS-CoV-2 by FDA under an Emergency Use Authorization (EUA).  This EUA will remain in effect (meaning this test can be used) for the duration of the COVID-19 declaration under Section 564(b)(1) of the Act, 21 U.S.C. section 360bbb-3(b)(1), unless the authorization is terminated or revoked sooner. Performed at [facility], [address] [place], [place] [address]          Radiology Studies: [facility] Chest [facility] 1 View  Result  Date: 05/20/2020 CLINICAL DATA:  Shortness of breath EXAM: PORTABLE CHEST 1 VIEW COMPARISON:  CT 05/18/2020 FINDINGS: Chronically coarsened interstitial changes and bronchitic features are similar to priors with some mild basilar atelectasis with low volumes and central vascular crowding. No new focal consolidative opacity is seen. No convincing features of edema. No pneumothorax or effusion. The aorta is calcified. The remaining cardiomediastinal contours are unremarkable. No acute osseous or soft tissue abnormality. Telemetry leads and nasal cannula overlie the chest. IMPRESSION: Chronically coarsened interstitial changes and bronchitic features, similar to priors. No acute cardiopulmonary abnormality. Electronically Signed   By: Lovena Le M.D.   On: 05/20/2020 04:35        Scheduled Meds: . DULoxetine  60 mg Oral Daily  . enoxaparin (LOVENOX) injection  40 mg Subcutaneous Q24H  . hydrochlorothiazide  12.5 mg Oral Daily   And  . irbesartan  75 mg Oral Daily  . ipratropium-albuterol  3 mL Nebulization Q6H  . meloxicam  7.5 mg Oral Daily  . methylPREDNISolone (SOLU-MEDROL) injection  40 mg Intravenous Q24H  . pantoprazole  40 mg Oral Daily   Continuous Infusions:   LOS: 0 days    Time spent: 25 minutes    Sharen Hones, MD [facility]   To contact the attending provider between 7A-7P or the covering provider during after hours 7P-7A, please log into the web site [email or web address] and access using universal [place]  password for that web site. If you do not have the password, please call the hospital operator.  05/21/2020, 9:08 AM

## 2020-05-21 NOTE — Progress Notes (Signed)
Cross Cover Brief Note Patient with new onset atrial fib with RVR 150's. Patient denies ever having cardiac or irregular rhythm problems.  She reports hypertension development related to hip pain.  Was due to have hip replacement in 05/2020.  Has had progressive decline in mobility now needing walker. Denies leg pains and in fact states that since she has been on steroids with her corona treatment and now COPD exacerbation, it has improved her hip and leg pain.  Denies shortness of breath and in fact states her breathingv feels better than when admitted.  Endorses cough with frothy white sputum production. Expiratory wheeze audible throughout EKG - confirms atrial fib with RVR 150 tsh bnp Trop Chest xray  D dimer all normal Minimal improvement with IV metoprolol x2 doses. Good result from IV cardizem.  Oral cardizem initiated.  Patient on treatment dose lovenox and started oral eliquis, CHADSVASC2 = 3. Cardiology consulted and echo ordered.

## 2020-05-21 NOTE — TOC Initial Note (Signed)
Transition of Care [facility]) - Initial/Assessment Note    Patient Details  Name: Sabrina Hudson MRN: [id number] Date of Birth: 1951/01/22  Transition of [facility]) CM/SW Contact:    Elease Hashimoto, LCSW Phone Number: 05/21/2020, 9:35 AM  Clinical Narrative: Met with pt to discuss discharge needs. She reports she does need a rolling walker and MD told her he would order a home nebulizer machine. She is agreeable to both and has no pref. Have made referral to [employer] for both of these pieces of equipment. She needs no home health services. Will continue to follow and work on discharge needs.               Expected Discharge Plan: Home/Self Care Barriers to Discharge: Continued Medical Work up   Patient Goals and CMS Choice Patient states their goals for this hospitalization and ongoing recovery are:: I am staying until tomorrow according to the doctor. I do feel better CMS Medicare.gov Compare Post Acute Care list provided to:: Patient Choice offered to / list presented to : Patient  Expected Discharge Plan and Services Expected Discharge Plan: Home/Self Care In-house Referral: Clinical Social Work   Post Acute Care Choice: Durable Medical Equipment Living arrangements for the past 2 months: Single Family Home                 DME Arranged: Chiropodist, Walker rolling DME Agency: [facility] Date DME Agency Contacted: 05/21/20 Time DME Agency Contacted: [phone] Representative spoke with at DME Agency: Thedore Mins            Prior Living Arrangements/Services Living arrangements for the past 2 months: Single Family Home Lives with:: Siblings(sister) Patient language and need for interpreter reviewed:: No Do you feel safe going back to the place where you live?: Yes      Need for Family Participation in Patient Care: Yes (Comment) Care giver support system in place?: Yes (comment)   Criminal Activity/Legal Involvement Pertinent to Current Situation/Hospitalization: No -  Comment as needed  Activities of Daily Living Home Assistive Devices/Equipment: Wheelchair, Radio producer (specify quad or straight) ADL Screening (condition at time of admission) Patient's cognitive ability adequate to safely complete daily activities?: Yes Is the patient deaf or have difficulty hearing?: Yes Does the patient have difficulty seeing, even when wearing glasses/contacts?: No Does the patient have difficulty concentrating, remembering, or making decisions?: No Patient able to express need for assistance with ADLs?: No Does the patient have difficulty dressing or bathing?: No Independently performs ADLs?: Yes (appropriate for developmental age) Does the patient have difficulty walking or climbing stairs?: Yes Weakness of Legs: None Weakness of Arms/Hands: None  Permission Sought/Granted Permission sought to share information with : Family Supports Permission granted to share information with : Yes, Verbal Permission Granted  Share Information with NAME: Neoma Laming  Permission granted to share info w AGENCY: [employer]  Permission granted to share info w Relationship: sister  Permission granted to share info w Contact Information: [place]  Emotional Assessment Appearance:: Appears stated age Attitude/Demeanor/Rapport: Gracious Affect (typically observed): Adaptable, Accepting Orientation: : Oriented to Self, Oriented to Place, Oriented to  Time, Oriented to Situation   Psych Involvement: No (comment)  Admission diagnosis:  Hypokalemia [E87.6] COPD exacerbation ([place]) [J44.1] Patient Active Problem List   Diagnosis Date Noted  . COPD with acute exacerbation ([place]) 05/20/2020  . HTN (hypertension) 05/20/2020  . COPD exacerbation ([place]) 05/20/2020   PCP:  [place] [facility]:   [id number] [place] [address] [place] [address] Phone: [phone] Fax: [phone]     Social Determinants of Health (SDOH) Interventions    Readmission Risk  Interventions No flowsheet data found.

## 2020-05-21 NOTE — Plan of Care (Signed)

## 2020-05-21 NOTE — Progress Notes (Signed)
Even though the severity score is a 5 on the RT protocol assessment, I feel like she could benefit from QID nebulizer treatments. She will be covered with prn treatments at night.

## 2020-05-22 ENCOUNTER — Inpatient Hospital Stay (HOSPITAL_COMMUNITY)
Admit: 2020-05-22 | Discharge: 2020-05-22 | Disposition: A | Discharge status: Home / Self Care | Payer: Medicare Other | Attending: Acute Care | Admitting: Acute Care

## 2020-05-22 DIAGNOSIS — J441 Chronic obstructive pulmonary disease with (acute) exacerbation: Principal | ICD-10-CM

## 2020-05-22 DIAGNOSIS — F17200 Nicotine dependence, unspecified, uncomplicated: Secondary | ICD-10-CM

## 2020-05-22 DIAGNOSIS — I480 Paroxysmal atrial fibrillation: Secondary | ICD-10-CM

## 2020-05-22 DIAGNOSIS — I10 Essential (primary) hypertension: Secondary | ICD-10-CM

## 2020-05-22 DIAGNOSIS — R0602 Shortness of breath: Secondary | ICD-10-CM

## 2020-05-22 LAB — CBC
HCT: 45.8 % (ref 36.0–46.0)
Hemoglobin: 15.6 g/dL — ABNORMAL HIGH (ref 12.0–15.0)
MCH: 31.3 pg (ref 26.0–34.0)
MCHC: 34.1 g/dL (ref 30.0–36.0)
MCV: 91.8 fL (ref 80.0–100.0)
Platelets: 228 10*3/uL (ref 150–400)
RBC: 4.99 MIL/uL (ref 3.87–5.11)
RDW: 13.7 % (ref 11.5–15.5)
WBC: 13.7 10*3/uL — ABNORMAL HIGH (ref 4.0–10.5)
nRBC: 0 % (ref 0.0–0.2)

## 2020-05-22 LAB — BASIC METABOLIC PANEL
Anion gap: 9 (ref 5–15)
BUN: 20 mg/dL (ref 8–23)
CO2: 29 mmol/L (ref 22–32)
Calcium: 9.2 mg/dL (ref 8.9–10.3)
Chloride: 100 mmol/L (ref 98–111)
Creatinine, Ser: 0.77 mg/dL (ref 0.44–1.00)
GFR calc Af Amer: >60 mL/min (ref >60)
GFR calc non Af Amer: >60 mL/min (ref >60)
Glucose, Bld: 93 mg/dL (ref 70–99)
Potassium: 4 mmol/L (ref 3.5–5.1)
Sodium: 138 mmol/L (ref 135–145)

## 2020-05-22 LAB — ECHOCARDIOGRAM COMPLETE
Height: 67 in
Weight: 2920.65 oz

## 2020-05-22 LAB — HEMOGLOBIN A1C
Hgb A1c MFr Bld: 5.8 % — ABNORMAL HIGH (ref 4.8–5.6)
Mean Plasma Glucose: 119.76 mg/dL

## 2020-05-22 LAB — GLUCOSE, CAPILLARY
Glucose-Capillary: 93 mg/dL (ref 70–99)
Glucose-Capillary: 104 mg/dL — ABNORMAL HIGH (ref 70–99)
Glucose-Capillary: 117 mg/dL — ABNORMAL HIGH (ref 70–99)
Glucose-Capillary: 115 mg/dL — ABNORMAL HIGH (ref 70–99)

## 2020-05-22 LAB — TROPONIN I (HIGH SENSITIVITY): Troponin I (High Sensitivity): 8 ng/L (ref <18)

## 2020-05-22 LAB — MAGNESIUM: Magnesium: 2.5 mg/dL — ABNORMAL HIGH (ref 1.7–2.4)

## 2020-05-22 MED ORDER — SODIUM CHLORIDE 0.9 % IV SOLN
500.0000 mg | INTRAVENOUS | Status: DC
  Administered 2020-05-22: 500 mg via INTRAVENOUS
  Filled 2020-05-22 (×3): qty 500

## 2020-05-22 MED ORDER — SENNA 8.6 MG PO TABS
1.0000 | ORAL_TABLET | Freq: Every day | ORAL | Status: DC
  Administered 2020-05-22 – 2020-05-23 (×2): 8.6 mg via ORAL
  Filled 2020-05-22 (×2): qty 1

## 2020-05-22 MED ORDER — GUAIFENESIN ER 600 MG PO TB12
600.0000 mg | ORAL_TABLET | Freq: Two times a day (BID) | ORAL | Status: DC
  Administered 2020-05-22 – 2020-05-23 (×3): 600 mg via ORAL
  Filled 2020-05-22 (×4): qty 1

## 2020-05-22 MED ORDER — DOCUSATE SODIUM 100 MG PO CAPS
100.0000 mg | ORAL_CAPSULE | Freq: Every day | ORAL | Status: DC
  Administered 2020-05-22 – 2020-05-23 (×2): 100 mg via ORAL
  Filled 2020-05-22 (×2): qty 1

## 2020-05-22 MED ORDER — IPRATROPIUM BROMIDE 0.02 % IN SOLN: 0.5000 mg | RESPIRATORY_TRACT | Status: DC | PRN

## 2020-05-22 MED ORDER — IPRATROPIUM-ALBUTEROL 0.5-2.5 (3) MG/3ML IN SOLN
3.0000 mL | Freq: Four times a day (QID) | RESPIRATORY_TRACT | Status: DC
  Administered 2020-05-22 – 2020-05-23 (×4): 3 mL via RESPIRATORY_TRACT
  Filled 2020-05-22 (×4): qty 3

## 2020-05-22 MED ORDER — PERFLUTREN LIPID MICROSPHERE
1.0000 mL | INTRAVENOUS | Status: AC | PRN
  Administered 2020-05-22: 2 mL via INTRAVENOUS
  Filled 2020-05-22: qty 10

## 2020-05-22 MED ORDER — INSULIN ASPART 100 UNIT/ML ~~LOC~~ SOLN: 0.0000 [IU] | Freq: Three times a day (TID) | SUBCUTANEOUS | Status: DC

## 2020-05-22 MED ORDER — DILTIAZEM HCL ER COATED BEADS 120 MG PO CP24
120.0000 mg | ORAL_CAPSULE | Freq: Every day | ORAL | Status: DC
  Administered 2020-05-22 – 2020-05-23 (×2): 120 mg via ORAL
  Filled 2020-05-22 (×3): qty 1

## 2020-05-22 MED ORDER — DILTIAZEM HCL 30 MG PO TABS
30.0000 mg | ORAL_TABLET | Freq: Four times a day (QID) | ORAL | Status: DC
  Administered 2020-05-22 (×3): 30 mg via ORAL
  Filled 2020-05-22 (×3): qty 1

## 2020-05-22 NOTE — Progress Notes (Signed)
PROGRESS NOTE    Sabrina Hudson  [id number] DOB: [birth year] DOA: 05/20/2020 PCP: [place]    Brief Narrative:  Patient is a 69 year old female with history of COPD, essential hypertension, who present to the emergency room with a worsening short of breath and wheezing. She had a Covid in February 2021, since recovering from it, patient has a worsening short of breath. She can barely walk a few steps. Since that time, she had quit smoking. Upon arrival in the emergency room, she did not have any hypoxemia, but had a severe bronchospasm. She was placed on IV steroids.  On 5/25 nights- pt went into afib rvr.  Consultants:   cards  Procedures:  Antimicrobials:      Subjective: Still wheezy. No new complaints. Tele afib   Objective: Vitals:   05/22/20 0607 05/22/20 0739 05/22/20 1151 05/22/20 1414  BP: (!) 162/89 (!) 154/96 136/72   Pulse:  71 72   Resp:  18    Temp:  97.8 F (36.6 C) 98.2 F (36.8 C)   TempSrc:  Oral    SpO2:    93%  Weight:      Height:        Intake/Output Summary (Last 24 hours) at 05/22/2020 1454 Last data filed at 05/22/2020 1400 Gross per 24 hour  Intake 360 ml  Output 1450 ml  Net -1090 ml   Filed Weights   05/20/20 0407 05/22/20 0500  Weight: 80 kg 82.8 kg    Examination:  General exam: Appears calm and comfortable  Respiratory system: +junky rhonchi, expiratory wheezing Cardiovascular system: S1 & S2 heard, RRR. No JVD, murmurs, rubs, gallops or clicks.  Gastrointestinal system: Abdomen is nondistended, soft and nontender.. Normal bowel sounds heard. Central nervous system: Alert and oriented.grossly intact Extremities: No edema Skin: Warm dry  Psychiatry: Judgement and insight appear normal. Mood & affect appropriate.     Data Reviewed: I have personally reviewed following labs and imaging studies  CBC: Recent Labs  Lab 05/18/20 0918 05/20/20 0410 05/22/20 0641  WBC 5.5 8.0 13.7*  NEUTROABS  --   4.8  --   HGB 13.8 16.0* 15.6*  HCT 41.3 47.8* 45.8  MCV 93.0 93.0 91.8  PLT 208 246 [id number]   Basic Metabolic Panel: Recent Labs  Lab 05/18/20 0918 05/20/20 0410 05/21/20 0528 05/22/20 0641  NA 139 141 141 138  K 3.5 3.0* 4.7 4.0  CL 104 105 107 100  CO2 25 28 28 29   GLUCOSE 99 99 147* 93  BUN 11 13 18 20   CREATININE 0.64 0.67 0.71 0.77  CALCIUM 9.0 9.1 9.4 9.2  MG  --   --  2.2 2.5*   GFR: Estimated Creatinine Clearance: 76.6 mL/min (by C-G formula based on SCr of 0.77 mg/dL). Liver Function Tests: Recent Labs  Lab 05/18/20 0918  AST 17  ALT 15  ALKPHOS 88  BILITOT 0.7  PROT 6.9  ALBUMIN 4.1   No results for input(s): LIPASE, AMYLASE in the last 168 hours. No results for input(s): AMMONIA in the last 168 hours. Coagulation Profile: No results for input(s): INR, PROTIME in the last 168 hours. Cardiac Enzymes: No results for input(s): CKTOTAL, CKMB, CKMBINDEX, TROPONINI in the last 168 hours. BNP (last 3 results) No results for input(s): PROBNP in the last 8760 hours. HbA1C: Recent Labs    05/21/20 2209  HGBA1C 5.8*   CBG: Recent Labs  Lab 05/22/20 0743 05/22/20 1157  GLUCAP 93 104*   Lipid Profile: No  results for input(s): CHOL, HDL, LDLCALC, TRIG, CHOLHDL, LDLDIRECT in the last 72 hours. Thyroid Function Tests: Recent Labs    05/21/20 2209  TSH 0.763   Anemia Panel: No results for input(s): VITAMINB12, FOLATE, FERRITIN, TIBC, IRON, RETICCTPCT in the last 72 hours. Sepsis Labs: Recent Labs  Lab 05/18/20 0918  PROCALCITON <0.10  LATICACIDVEN 0.7    Recent Results (from the past 240 hour(s))  SARS Coronavirus 2 by RT PCR (hospital order, performed in [facility] hospital lab) Nasopharyngeal Nasopharyngeal Swab     Status: None   Collection Time: 05/18/20  9:18 AM   Specimen: Nasopharyngeal Swab  Result Value Ref Range Status   SARS Coronavirus 2 NEGATIVE NEGATIVE Final    Comment: (NOTE) SARS-CoV-2 target nucleic acids are NOT  DETECTED. The SARS-CoV-2 RNA is generally detectable in upper and lower respiratory specimens during the acute phase of infection. The lowest concentration of SARS-CoV-2 viral copies this assay can detect is 250 copies / mL. A negative result does not preclude SARS-CoV-2 infection and should not be used as the sole basis for treatment or other patient management decisions.  A negative result may occur with improper specimen collection / handling, submission of specimen other than nasopharyngeal swab, presence of viral mutation(s) within the areas targeted by this assay, and inadequate number of viral copies (<250 copies / mL). A negative result must be combined with clinical observations, patient history, and epidemiological information. Fact Sheet for Patients:   StrictlyIdeas.no Fact Sheet for Healthcare Providers: BankingDealers.co.za This test is not yet approved or cleared  by the [place] FDA and has been authorized for detection and/or diagnosis of SARS-CoV-2 by FDA under an Emergency Use Authorization (EUA).  This EUA will remain in effect (meaning this test can be used) for the duration of the COVID-19 declaration under Section 564(b)(1) of the Act, 21 U.S.C. section 360bbb-3(b)(1), unless the authorization is terminated or revoked sooner. Performed at [facility], [address], [place], [place] [address]          Radiology Studies: DG Chest 1 View  Result Date: 05/21/2020 CLINICAL DATA:  Wheezing, tachycardia EXAM: CHEST  1 VIEW COMPARISON:  05/20/2020 FINDINGS: Single frontal view of the chest demonstrates a stable cardiac silhouette. No acute airspace disease, effusion, or pneumothorax. Chronic interstitial scarring unchanged. No acute bony abnormalities. IMPRESSION: 1. Stable exam, no acute process. Electronically Signed   By: Randa Ngo M.D.   On: 05/21/2020 22:27   ECHOCARDIOGRAM COMPLETE  Result  Date: 05/22/2020    ECHOCARDIOGRAM REPORT   Patient Name:   Sabrina Hudson Date of Exam: 05/22/2020 Medical Rec #:  [id number]         Height:       67.0 in Accession #:    [id number]        Weight:       182.5 lb Date of Birth:  03/26/1951        BSA:          1.945 m Patient Age:    61 years          BP:           154/96 mmHg Patient Gender: F                 HR:           71 bpm. Exam Location:  [facility] Procedure: 2D Echo, Cardiac Doppler, Color Doppler and Intracardiac  Opacification Agent Indications:     Dyspnea 786.09  History:         Patient has no prior history of Echocardiogram examinations.                  Risk Factors:Hypertension.  Sonographer:     Sherrie Sport RDCS (AE) Referring Phys:  [id number] BRENDA MORRISON Diagnosing Phys: Kate Sable MD IMPRESSIONS  1. Left ventricular ejection fraction, by estimation, is 50 to 55%. The left ventricle has low normal function. The left ventricle has no regional wall motion abnormalities. Left ventricular diastolic function could not be evaluated.  2. Right ventricular systolic function is normal. The right ventricular size is normal. There is normal pulmonary artery systolic pressure.  3. The mitral valve is normal in structure. No evidence of mitral valve regurgitation. No evidence of mitral stenosis.  4. The aortic valve is grossly normal. Aortic valve regurgitation is not visualized. No aortic stenosis is present.  5. Pulmonic valve regurgitation not assessed.  6. The inferior vena cava is normal in size with greater than 50% respiratory variability, suggesting right atrial pressure of 3 mmHg. FINDINGS  Left Ventricle: Left ventricular ejection fraction, by estimation, is 50 to 55%. The left ventricle has low normal function. The left ventricle has no regional wall motion abnormalities. Definity contrast agent was given IV to delineate the left ventricular endocardial borders. The left ventricular internal cavity size was normal in size. There is  no left ventricular hypertrophy. Left ventricular diastolic function could not be evaluated. Right Ventricle: The right ventricular size is normal. No increase in right ventricular wall thickness. Right ventricular systolic function is normal. There is normal pulmonary artery systolic pressure. The tricuspid regurgitant velocity is 1.85 m/s, and  with an assumed right atrial pressure of 3 mmHg, the estimated right ventricular systolic pressure is 123456 mmHg. Left Atrium: Left atrial size was normal in size. Right Atrium: Right atrial size was normal in size. Pericardium: There is no evidence of pericardial effusion. Mitral Valve: The mitral valve is normal in structure. Normal mobility of the mitral valve leaflets. No evidence of mitral valve regurgitation. No evidence of mitral valve stenosis. Tricuspid Valve: The tricuspid valve is grossly normal. Tricuspid valve regurgitation is not demonstrated. No evidence of tricuspid stenosis. Aortic Valve: The aortic valve is grossly normal. Aortic valve regurgitation is not visualized. No aortic stenosis is present. Aortic valve mean gradient measures 2.0 mmHg. Aortic valve peak gradient measures 3.4 mmHg. Aortic valve area, by VTI measures 1.85 cm. Pulmonic Valve: The pulmonic valve was not well visualized. Pulmonic valve regurgitation not assessed. No evidence of pulmonic stenosis. Aorta: The aortic root is normal in size and structure and the aortic root was not well visualized. Venous: The inferior vena cava is normal in size with greater than 50% respiratory variability, suggesting right atrial pressure of 3 mmHg. IAS/Shunts: No atrial level shunt detected by color flow Doppler.  LEFT VENTRICLE PLAX 2D LVIDd:         3.54 cm LVIDs:         2.61 cm LV PW:         1.29 cm LV IVS:        1.24 cm LVOT diam:     2.00 cm LV SV:         32 LV SV Index:   17 LVOT Area:     3.14 cm  RIGHT VENTRICLE RV Basal diam:  3.72 cm RV S prime:     10.90  cm/s TAPSE (M-mode): 2.7 cm LEFT  ATRIUM             Index       RIGHT ATRIUM           Index LA diam:        3.40 cm 1.75 cm/m  RA Area:     11.50 cm LA Vol (A2C):   64.6 ml 33.21 ml/m RA Volume:   24.70 ml  12.70 ml/m LA Vol (A4C):   49.5 ml 25.45 ml/m LA Biplane Vol: 59.1 ml 30.38 ml/m  AORTIC VALVE                   PULMONIC VALVE AV Area (Vmax):    2.30 cm    PV Vmax:        0.72 m/s AV Area (Vmean):   2.22 cm    PV Peak grad:   2.1 mmHg AV Area (VTI):     1.85 cm    RVOT Peak grad: 3 mmHg AV Vmax:           91.85 cm/s AV Vmean:          65.350 cm/s AV VTI:            0.175 m AV Peak Grad:      3.4 mmHg AV Mean Grad:      2.0 mmHg LVOT Vmax:         67.20 cm/s LVOT Vmean:        46.200 cm/s LVOT VTI:          0.103 m LVOT/AV VTI ratio: 0.59  AORTA Ao Root diam: 2.40 cm MITRAL VALVE               TRICUSPID VALVE MV Area (PHT): 3.27 cm    TR Peak grad:   13.7 mmHg MV Decel Time: 232 msec    TR Vmax:        185.00 cm/s MV E velocity: 90.70 cm/s                            SHUNTS                            Systemic VTI:  0.10 m                            Systemic Diam: 2.00 cm Kate Sable MD Electronically signed by Kate Sable MD Signature Date/Time: 05/22/2020/2:14:35 PM    Final         Scheduled Meds: . apixaban  5 mg Oral BID  . diltiazem  120 mg Oral Daily  . docusate sodium  100 mg Oral Daily  . DULoxetine  60 mg Oral Daily  . guaiFENesin  600 mg Oral BID  . hydrochlorothiazide  12.5 mg Oral Daily   And  . irbesartan  75 mg Oral Daily  . insulin aspart  0-15 Units Subcutaneous TID AC & HS  . ipratropium-albuterol  3 mL Nebulization Q6H  . meloxicam  7.5 mg Oral Daily  . methylPREDNISolone (SOLU-MEDROL) injection  40 mg Intravenous Q24H  . pantoprazole  40 mg Oral Daily  . senna  1 tablet Oral Daily   Continuous Infusions:  Assessment & Plan:   Principal Problem:   COPD with acute exacerbation (HCC) Active Problems:   HTN (hypertension)   COPD  exacerbation ([place])   Atrial fibrillation with  RVR ([place])   Smoking   #1.  COPD exacerbation. Patient still has significant bronchospasm.  Continue on IV steroids We will add IV azithromycin Duo nebs, mucinex Keep 02 sat 92 and above  A walker and a nebulizer prescribed.  #2. Essential hypertension. Continue home medicines.  3.  Hypokalemia. Resolved.  4.afib rvr-now rate controlled Cards consulted- echo Ef 50-55% Chadsvasc 3.  Change cardizem to long acting Continue with Eliquis F/u cards as outpt  5.current smoker-discussed smoking cessation extensively with the patient and discussed risk and complications of ongoing smoking she has decided to quit.  DVT prophylaxis:Lovenox Code Status:Full Family Communication:Non Disposition Plan: Back to home life Barrier: Patient still quite wheezy and not stable to be discharged home.  Will still require IV steroids and IV antibiotics.  Possible DC in couple of days with symptom improvement      LOS: 1 day   Time spent: 45 min with more than 50% COC    Nolberto Hanlon, MD [facility] Pager [phone]  If 7PM-7AM, please contact night-coverage [email or web address] Password [facility] 05/22/2020, 2:54 PM

## 2020-05-22 NOTE — Progress Notes (Signed)
   05/21/20 2232  Assess: MEWS Score  Temp 98.7 F (37.1 C)  BP (!) 132/108  Pulse Rate (!) 120  ECG Heart Rate (!) 120  Resp 15  Level of Consciousness Alert  SpO2 91 %  O2 Device Room Air  Assess: MEWS Score  MEWS Temp 0  MEWS Systolic 0  MEWS Pulse 2  MEWS RR 0  MEWS LOC 0  MEWS Score 2  MEWS Score Color Yellow  Assess: if the MEWS score is Yellow or Red  Were vital signs taken at a resting state? Yes  Focused Assessment Documented focused assessment  Early Detection of Sepsis Score *See Row Information* Low  MEWS guidelines implemented *See Row Information* Yes  Treat  MEWS Interventions Escalated (See documentation below)  Take Vital Signs  Increase Vital Sign Frequency  Yellow: Q 2hr X 2 then Q 4hr X 2, if remains yellow, continue Q 4hrs  Escalate  MEWS: Escalate Yellow: discuss with charge nurse/RN and consider discussing with provider and RRT  Notify: Charge Nurse/RN  Name of Charge Nurse/RN Notified Regino Schultze RN  Date Charge Nurse/RN Notified 05/21/20  Time Charge Nurse/RN Notified 2232  Notify: Provider  Provider Name/Title Sharion Settler NP  Date Provider Notified 05/21/20  Time Provider Notified 2232  Notification Type Page  Notification Reason Change in status  Response See new orders  Date of Provider Response 05/21/20  Time of Provider Response 2232  Document  Patient Outcome Stabilized after interventions;Other (Comment) (Continuing to monitor)

## 2020-05-22 NOTE — Consult Note (Signed)
Cardiology Consultation:   Patient ID: Sabrina Hudson MRN: [id number]; DOB: [birth year]  Admit date: 05/20/2020 Date of Consult: 05/22/2020  Primary Care Provider: [facility] Primary Cardiologist:New- Dr. Garen Lah rounding Primary Electrophysiologist:  None    Patient Profile:   Sabrina Hudson is a 69 y.o. female with a hx of COPD, hypertension, current smoker x40 years who is being seen today for the evaluation of atrial fibrillation with rapid ventricular response at the request of Dr. Kurtis Bushman.  History of Present Illness:   Sabrina Hudson is a 69 year old female with history of COPD, hypertension, current smoker x40+ years who presents to the hospital with shortness of breath, cough and wheezing.  Patient was seen in the emergency room 3 days ago and subsequently discharged home.  When she went home, her symptoms of shortness of breath cough did not improve.  She endorses low-grade fevers.  She called EMS and was brought to the emergency room.  She was diagnosed with COPD exacerbation.  Started on antibiotics and steroids per medicine team.    EKG obtained yesterday showed atrial fibrillation with rapid ventricular response.  High-sensitivity troponins were within normal limits.  Patient was started on p.o. Cardizem 30 mg every 6 hours with improvement in heart rates.  Telemetry monitoring showed atrial fibrillation earlier this morning, but patient converted to sinus rhythm at about noon.  Eliquis was also started and echocardiogram ordered.  She denies any history of atrial fibrillation, palpitations, chest pain.  Denies any cardiac history.  She is determined to stop smoking and wants to get better.   Past Medical History:  Diagnosis Date  . Anxiety   . GERD (gastroesophageal reflux disease)   . Hypertension     Past Surgical History:  Procedure Laterality Date  . ABDOMINAL HYSTERECTOMY    . TONSILLECTOMY       Home Medications:  Prior to Admission  medications   Medication Sig Start Date End Date Taking? Authorizing Provider  azithromycin (ZITHROMAX Z-PAK) 250 MG tablet Take 2 tablets (500 mg) on  Day 1,  followed by 1 tablet (250 mg) once daily on Days 2 through 5. 05/18/20 05/23/20 Yes Paduchowski, Lennette Bihari, MD  DULoxetine (CYMBALTA) 60 MG capsule Take 60 mg by mouth daily.   Yes [provider]  fluticasone (FLONASE) 50 MCG/ACT nasal spray Place 1 spray into both nostrils daily as needed for allergies or rhinitis.   Yes [provider]  HYDROcodone-acetaminophen (NORCO) 5-325 MG tablet Take 0.5 tablets by mouth every 6 (six) hours as needed for moderate pain. Patient taking differently: Take 0.5-1 tablets by mouth every 6 (six) hours as needed for moderate pain.  02/05/20  Yes Laban Emperor, PA-C  ibuprofen (ADVIL) 200 MG tablet Take 400 mg by mouth every 6 (six) hours as needed for moderate pain.   Yes [provider]  omeprazole (PRILOSEC) 40 MG capsule Take 40 mg by mouth daily. 02/26/20  Yes [provider]  predniSONE (DELTASONE) 20 MG tablet Take 2 tablets (40 mg total) by mouth daily. 05/18/20  Yes Harvest Dark, MD  valsartan-hydrochlorothiazide (DIOVAN-HCT) 80-12.5 MG tablet Take 1 tablet by mouth daily.   Yes [provider]  acetaminophen (TYLENOL) 500 MG tablet Take 500 mg by mouth every 6 (six) hours as needed for moderate pain.    [provider]  lidocaine (LIDODERM) 5 % Place 1 patch onto the skin daily. Remove & Discard patch within 12 hours or as directed by MD Patient not taking: Reported on 05/15/2020  02/05/20   Laban Emperor, PA-C  meloxicam (MOBIC) 7.5 MG tablet Take 1 tablet (7.5 mg total) by mouth daily. Patient not taking: Reported on 05/15/2020 02/05/20 02/04/21  Laban Emperor, PA-C    Inpatient Medications: Scheduled Meds: . apixaban  5 mg Oral BID  . diltiazem  30 mg Oral Q6H  . docusate sodium  100 mg Oral Daily  . DULoxetine  60 mg Oral Daily  . guaiFENesin   600 mg Oral BID  . hydrochlorothiazide  12.5 mg Oral Daily   And  . irbesartan  75 mg Oral Daily  . insulin aspart  0-15 Units Subcutaneous TID AC & HS  . ipratropium-albuterol  3 mL Nebulization Q6H  . meloxicam  7.5 mg Oral Daily  . methylPREDNISolone (SOLU-MEDROL) injection  40 mg Intravenous Q24H  . pantoprazole  40 mg Oral Daily  . senna  1 tablet Oral Daily   Continuous Infusions:  PRN Meds: benzonatate, fluticasone, HYDROcodone-acetaminophen, ipratropium, levalbuterol  Allergies:   No Known Allergies  Social History:   Social History   Socioeconomic History  . Marital status: Widowed    Spouse name: Not on file  . Number of children: Not on file  . Years of education: Not on file  . Highest education level: Not on file  Occupational History  . Not on file  Tobacco Use  . Smoking status: Former Smoker    Packs/day: 0.50    Years: 45.00    Pack years: 22.50    Types: Cigarettes    Quit date: 05/10/2020    Years since quitting: 0.0  . Smokeless tobacco: Never Used  Substance and Sexual Activity  . Alcohol use: Not on file  . Drug use: Not on file  . Sexual activity: Not on file  Other Topics Concern  . Not on file  Social History Narrative  . Not on file   Social Determinants of Health   Financial Resource Strain:   . Difficulty of Paying Living Expenses:   Food Insecurity:   . Worried About Charity fundraiser in the Last Year:   . Arboriculturist in the Last Year:   Transportation Needs:   . Film/video editor (Medical):   Marland Kitchen Lack of Transportation (Non-Medical):   Physical Activity:   . Days of Exercise per Week:   . Minutes of Exercise per Session:   Stress:   . Feeling of Stress :   Social Connections:   . Frequency of Communication with Friends and Family:   . Frequency of Social Gatherings with Friends and Family:   . Attends Religious Services:   . Active Member of Clubs or Organizations:   . Attends Archivist Meetings:   Marland Kitchen  Marital Status:   Intimate Partner Violence:   . Fear of Current or Ex-Partner:   . Emotionally Abused:   Marland Kitchen Physically Abused:   . Sexually Abused:     Family History:    Family History  Problem Relation Age of Onset  . Breast cancer Neg Hx      ROS:  Please see the history of present illness.   All other ROS reviewed and negative.     Physical Exam/Data:   Vitals:   05/22/20 0500 05/22/20 0607 05/22/20 0739 05/22/20 1151  BP:  (!) 162/89 (!) 154/96 136/72  Pulse:   71 72  Resp:   18   Temp:   97.8 F (36.6 C) 98.2 F (36.8 C)  TempSrc:   Oral  SpO2:      Weight: 82.8 kg     Height:        Intake/Output Summary (Last 24 hours) at 05/22/2020 1358 Last data filed at 05/22/2020 0600 Gross per 24 hour  Intake --  Output 1450 ml  Net -1450 ml   Last 3 Weights 05/22/2020 05/20/2020 05/18/2020  Weight (lbs) 182 lb 8.7 oz 176 lb 5.9 oz 178 lb  Weight (kg) 82.8 kg 80 kg 80.74 kg     Body mass index is 28.59 kg/m.  General:  Well nourished, well developed, mild respiratory distress HEENT: normal Lymph: no adenopathy Neck: no JVD Endocrine:  No thryomegaly Vascular: No carotid bruits; FA pulses 2+ bilaterally without bruits  Cardiac:  normal S1, S2; RRR; no murmur  Lungs: Expiratory wheezing noted bilaterally. Abd: soft, nontender, no hepatomegaly  Ext: no edema Musculoskeletal:  No deformities, BUE and BLE strength normal and equal Skin: warm and dry  Neuro:  CNs 2-12 intact, no focal abnormalities noted Psych:  Normal affect   EKG:  The EKG was personally reviewed and demonstrates: Atrial fibrillation, heart rate 130 Telemetry:  Telemetry was personally reviewed and demonstrates: Atrial fibrillation at 7:30 AM, normal sinus rhythm at 11:46 AM  Relevant CV Studies: Echocardiogram pending  Laboratory Data:  High Sensitivity Troponin:   Recent Labs  Lab 05/20/20 0410 05/21/20 2209 05/22/20 0047  TROPONINIHS 6 8 8      Chemistry Recent Labs  Lab  05/20/20 0410 05/21/20 0528 05/22/20 0641  NA 141 141 138  K 3.0* 4.7 4.0  CL 105 107 100  CO2 28 28 29   GLUCOSE 99 147* 93  BUN 13 18 20   CREATININE 0.67 0.71 0.77  CALCIUM 9.1 9.4 9.2  GFRNONAA >60 >60 >60  GFRAA >60 >60 >60  ANIONGAP 8 6 9     Recent Labs  Lab 05/18/20 0918  PROT 6.9  ALBUMIN 4.1  AST 17  ALT 15  ALKPHOS 88  BILITOT 0.7   Hematology Recent Labs  Lab 05/18/20 0918 05/20/20 0410 05/22/20 0641  WBC 5.5 8.0 13.7*  RBC 4.44 5.14* 4.99  HGB 13.8 16.0* 15.6*  HCT 41.3 47.8* 45.8  MCV 93.0 93.0 91.8  MCH 31.1 31.1 31.3  MCHC 33.4 33.5 34.1  RDW 13.2 13.1 13.7  PLT 208 246 228   BNP Recent Labs  Lab 05/21/20 2209  BNP 141.0*    DDimer No results for input(s): DDIMER in the last 168 hours.   Radiology/Studies:  DG Chest 1 View  Result Date: 05/21/2020 CLINICAL DATA:  Wheezing, tachycardia EXAM: CHEST  1 VIEW COMPARISON:  05/20/2020 FINDINGS: Single frontal view of the chest demonstrates a stable cardiac silhouette. No acute airspace disease, effusion, or pneumothorax. Chronic interstitial scarring unchanged. No acute bony abnormalities. IMPRESSION: 1. Stable exam, no acute process. Electronically Signed   By: Randa Ngo M.D.   On: 05/21/2020 22:27   DG Chest Port 1 View  Result Date: 05/20/2020 CLINICAL DATA:  Shortness of breath EXAM: PORTABLE CHEST 1 VIEW COMPARISON:  CT 05/18/2020 FINDINGS: Chronically coarsened interstitial changes and bronchitic features are similar to priors with some mild basilar atelectasis with low volumes and central vascular crowding. No new focal consolidative opacity is seen. No convincing features of edema. No pneumothorax or effusion. The aorta is calcified. The remaining cardiomediastinal contours are unremarkable. No acute osseous or soft tissue abnormality. Telemetry leads and nasal cannula overlie the chest. IMPRESSION: Chronically coarsened interstitial changes and bronchitic features, similar to priors. No  acute  cardiopulmonary abnormality. Electronically Signed   By: Lovena Le M.D.   On: 05/20/2020 04:35         Assessment and Plan:   1.  Paroxysmal atrial fibrillation, new onset -CHA2DS2-VASc of 3 (htn, age, gender) -Currently in sinus rhythm -Agree with Eliquis 5 mg twice daily -We will switch from p.o. Cardizem to long-acting Cardizem CD 120 mg daily. -We will review echo  2.  History of hypertension -Cardizem as above -PTA valsartan, HCTZ  3.  Current smoker -Cessation advised.  Patient determined to quit.  4.  COPD exacerbation -Steroids, neb treatments, antibiotics as per medicine team.  Signed, Kate Sable, MD  05/22/2020 1:58 PM

## 2020-05-22 NOTE — Progress Notes (Signed)
Patient took a diltiazem 30mg  this morning, she has a new order for Diltiazem 120mg  and its daily but due this evening. Messaged Dr. Garen Lah and he said to go ahead and give as scheduled.

## 2020-05-22 NOTE — Progress Notes (Signed)
Afib RVR new onset resolved with medications   05/21/20 2232  Assess: MEWS Score  ECG Heart Rate (!) 120  Assess: MEWS Score  MEWS Temp 0  MEWS Systolic 0  MEWS Pulse 2  MEWS RR 0  MEWS LOC 0  MEWS Score 2  MEWS Score Color Yellow  Assess: if the MEWS score is Yellow or Red  Were vital signs taken at a resting state? Yes  Focused Assessment Documented focused assessment  Early Detection of Sepsis Score *See Row Information* Low  MEWS guidelines implemented *See Row Information* Yes  Treat  MEWS Interventions Escalated (See documentation below)  Take Vital Signs  Increase Vital Sign Frequency  Yellow: Q 2hr X 2 then Q 4hr X 2, if remains yellow, continue Q 4hrs  Escalate  MEWS: Escalate Yellow: discuss with charge nurse/RN and consider discussing with provider and RRT  Notify: Charge Nurse/RN  Name of Charge Nurse/RN Notified Regino Schultze RN  Date Charge Nurse/RN Notified 05/21/20  Time Charge Nurse/RN Notified 2232  Notify: Provider  Provider Name/Title Sharion Settler NP  Date Provider Notified 05/21/20  Time Provider Notified 2232  Notification Type Page  Notification Reason Change in status  Response See new orders  Date of Provider Response 05/21/20  Time of Provider Response 2232  Document  Patient Outcome Stabilized after interventions;Other (Comment) (Continuing to monitor)

## 2020-05-22 NOTE — Progress Notes (Signed)
*  PRELIMINARY RESULTS* Echocardiogram 2D Echocardiogram has been performed.  Sabrina Hudson 05/22/2020, 11:22 AM

## 2020-05-23 DIAGNOSIS — I4891 Unspecified atrial fibrillation: Secondary | ICD-10-CM

## 2020-05-23 DIAGNOSIS — R062 Wheezing: Secondary | ICD-10-CM

## 2020-05-23 LAB — CBC
HCT: 43.9 % (ref 36.0–46.0)
Hemoglobin: 14.6 g/dL (ref 12.0–15.0)
MCH: 31.4 pg (ref 26.0–34.0)
MCHC: 33.3 g/dL (ref 30.0–36.0)
MCV: 94.4 fL (ref 80.0–100.0)
Platelets: 228 10*3/uL (ref 150–400)
RBC: 4.65 MIL/uL (ref 3.87–5.11)
RDW: 13.5 % (ref 11.5–15.5)
WBC: 11.3 10*3/uL — ABNORMAL HIGH (ref 4.0–10.5)
nRBC: 0 % (ref 0.0–0.2)

## 2020-05-23 LAB — GLUCOSE, CAPILLARY
Glucose-Capillary: 65 mg/dL — ABNORMAL LOW (ref 70–99)
Glucose-Capillary: 132 mg/dL — ABNORMAL HIGH (ref 70–99)

## 2020-05-23 MED ORDER — APIXABAN 5 MG PO TABS: 5.0000 mg | ORAL_TABLET | Freq: Two times a day (BID) | ORAL | 0 refills | Status: DC

## 2020-05-23 MED ORDER — PREDNISONE 20 MG PO TABS: 40.0000 mg | ORAL_TABLET | Freq: Every day | ORAL | 0 refills | Status: AC

## 2020-05-23 MED ORDER — BENZONATATE 100 MG PO CAPS: 100.0000 mg | ORAL_CAPSULE | Freq: Three times a day (TID) | ORAL | 0 refills | Status: DC | PRN

## 2020-05-23 MED ORDER — PREDNISONE 10 MG PO TABS
10.0000 mg | ORAL_TABLET | Freq: Every day | ORAL | 0 refills | Status: AC
Start: 2020-05-31 — End: 2020-06-03

## 2020-05-23 MED ORDER — PREDNISONE 20 MG PO TABS
20.0000 mg | ORAL_TABLET | Freq: Every day | ORAL | 0 refills | Status: AC
Start: 2020-05-27 — End: 2020-05-31

## 2020-05-23 MED ORDER — IPRATROPIUM-ALBUTEROL 0.5-2.5 (3) MG/3ML IN SOLN: 3.0000 mL | Freq: Four times a day (QID) | RESPIRATORY_TRACT | 0 refills | Status: DC

## 2020-05-23 MED ORDER — GUAIFENESIN ER 600 MG PO TB12: 600.0000 mg | ORAL_TABLET | Freq: Two times a day (BID) | ORAL | 0 refills | Status: AC

## 2020-05-23 MED ORDER — ALBUTEROL SULFATE HFA 108 (90 BASE) MCG/ACT IN AERS
2.0000 | INHALATION_SPRAY | Freq: Four times a day (QID) | RESPIRATORY_TRACT | 0 refills | Status: DC | PRN
Start: 2020-05-23 — End: 2021-12-31

## 2020-05-23 MED ORDER — DILTIAZEM HCL ER COATED BEADS 120 MG PO CP24: 120.0000 mg | ORAL_CAPSULE | Freq: Every day | ORAL | 0 refills | Status: DC

## 2020-05-23 NOTE — Discharge Summary (Signed)
Sabrina Hudson [id number] DOB: 1951-11-03 DOA: 05/20/2020  PCP: Derinda Late, MD  Admit date: 05/20/2020 Discharge date: 05/23/2020  Admitted From: Home Disposition: Home  Recommendations for Outpatient Follow-up:  1. Follow up with PCP in 1 week 2. Please obtain BMP/CBC in one week 3. Follow-up with cardiology Dr. Charlestine Night     Discharge Condition:Stable CODE STATUS: Full Diet recommendation: Heart Healthy  Brief/Interim Summary: Sabrina Hudson is a 69 y.o. female with medical history significant for hypertension and COPD who presents to the emergency room for the second time in 24 hours with wheezing not responding adequately to home bronchodilator therapy.  On her first visit to the emergency room yesterday she was treated and discharged on azithromycin and prednisone.  She had a chest CT that ruled out PE.  She tested negative for Covid.  She returned to ER for continued symptoms.  She was admitted to the hospital.  Started on IV steroids and IV azithromycin.  She also started on nebulizer treatments and Mucinex.  During her stay she was found in A. fib RVR while she was wheezing.  Cardiology was consulted.  She was started on Cardizem and Eliquis.  She is now in normal sinus rhythm.  She stopped wheezing.  And she is stable to be discharged home.  Nursing had patient walk without being on oxygen and she is satting 92%.  At home.  #1. COPD exacerbation. Patient still has significant bronchospasm.  Continue on IV steroids....>switch to prednisone taper D/c Iv azithromycin..>contiue home dose Duo nebs, mucinex Keep 02 sat 92 and above A walker and a nebulizer prescribed.  #2. Essential hypertension. Continue home medicines. Needs to f/u with pcp for further mx  3. Hypokalemia. Resolved.  4.afib rvr-now rate controlled...>now in NSR Cards consulted- echo Ef 50-55% Chadsvasc 3.  Change cardizem to long acting Continue with Eliquis F/u cards as outpt in 2  weeks  5.current smoker-discussed smoking cessation extensively with the patient and discussed risk and complications of ongoing smoking she has decided to quit.  Discharge Diagnoses:  Principal Problem:   COPD with acute exacerbation ([place]) Active Problems:   HTN (hypertension)   COPD exacerbation ([facility])   Atrial fibrillation with RVR ([facility])   Smoking    Discharge Instructions  Discharge Instructions    Amb referral to [facility]   Complete by: As directed    Call MD for:  difficulty breathing, headache or visual disturbances   Complete by: As directed    Call MD for:  temperature >100.4   Complete by: As directed    Diet - low sodium heart healthy   Complete by: As directed    Discharge instructions   Complete by: As directed    F/u with cardiology in 2 weeks F/u with pcp in 1-5 days You are on blood thinner, DO NOT take IBURPOFEN or NSAIDS.   Increase activity slowly   Complete by: As directed      Allergies as of 05/23/2020   No Known Allergies     Medication List    STOP taking these medications   ibuprofen 200 MG tablet Commonly known as: ADVIL   meloxicam 7.5 MG tablet Commonly known as: Mobic     TAKE these medications   acetaminophen 500 MG tablet Commonly known as: TYLENOL Take 500 mg by mouth every 6 (six) hours as needed for moderate pain.   albuterol 108 (90 Base) MCG/ACT inhaler Commonly known as: VENTOLIN HFA Inhale 2 puffs into the lungs every 6 (  six) hours as needed for wheezing or shortness of breath.   apixaban 5 MG Tabs tablet Commonly known as: ELIQUIS Take 1 tablet (5 mg total) by mouth 2 (two) times daily.   azithromycin 250 MG tablet Commonly known as: Zithromax Z-Pak Take 2 tablets (500 mg) on  Day 1,  followed by 1 tablet (250 mg) once daily on Days 2 through 5.   benzonatate 100 MG capsule Commonly known as: TESSALON Take 1 capsule (100 mg total) by mouth every 8 (eight) hours as needed for cough.   diltiazem 120 MG 24 hr  capsule Commonly known as: CARDIZEM CD Take 1 capsule (120 mg total) by mouth daily. Start taking on: May 24, 2020   DULoxetine 60 MG capsule Commonly known as: CYMBALTA Take 60 mg by mouth daily.   fluticasone 50 MCG/ACT nasal spray Commonly known as: FLONASE Place 1 spray into both nostrils daily as needed for allergies or rhinitis.   guaiFENesin 600 MG 12 hr tablet Commonly known as: MUCINEX Take 1 tablet (600 mg total) by mouth 2 (two) times daily for 7 days.   HYDROcodone-acetaminophen 5-325 MG tablet Commonly known as: Norco Take 0.5 tablets by mouth every 6 (six) hours as needed for moderate pain. What changed: how much to take   ipratropium-albuterol 0.5-2.5 (3) MG/3ML Soln Commonly known as: DUONEB Take 3 mLs by nebulization every 6 (six) hours.   lidocaine 5 % Commonly known as: Lidoderm Place 1 patch onto the skin daily. Remove & Discard patch within 12 hours or as directed by MD   omeprazole 40 MG capsule Commonly known as: PRILOSEC Take 40 mg by mouth daily.   predniSONE 20 MG tablet Commonly known as: Deltasone Take 2 tablets (40 mg total) by mouth daily for 3 days. Start taking on: May 24, 2020 What changed: Another medication with the same name was added. Make sure you understand how and when to take each.   predniSONE 20 MG tablet Commonly known as: DELTASONE Take 1 tablet (20 mg total) by mouth daily for 4 days. Start taking on: May 27, 2020 What changed: You were already taking a medication with the same name, and this prescription was added. Make sure you understand how and when to take each.   predniSONE 10 MG tablet Commonly known as: DELTASONE Take 1 tablet (10 mg total) by mouth daily for 3 days. Start taking on: May 31, 2020 What changed: You were already taking a medication with the same name, and this prescription was added. Make sure you understand how and when to take each.   valsartan-hydrochlorothiazide 80-12.5 MG tablet Commonly  known as: DIOVAN-HCT Take 1 tablet by mouth daily.            Durable Medical Equipment  (From admission, onward)         Start     Ordered   05/21/20 0940  For home use only DME Nebulizer machine  Once    Question Answer Comment  Patient needs a nebulizer to treat with the following condition COPD exacerbation ([facility])   Length of Need 6 Months      05/21/20 0939   05/21/20 0939  For home use only DME Walker  Once    Question:  Patient needs a walker to treat with the following condition  Answer:  COPD exacerbation ([place])   05/21/20 0939         Follow-up Information    Kate Sable, MD Follow up in 2 week(s).   Specialties:  Cardiology, Radiology Contact information: [place] [address] [id number]        Derinda Late, MD Follow up in 1 week(s).   Specialty: Family Medicine Contact information: [address] [facility] [place] [address] [phone]          No Known Allergies  Consultations:  cards   Procedures/Studies: DG Chest 1 View  Result Date: 05/21/2020 CLINICAL DATA:  Wheezing, tachycardia EXAM: CHEST  1 VIEW COMPARISON:  05/20/2020 FINDINGS: Single frontal view of the chest demonstrates a stable cardiac silhouette. No acute airspace disease, effusion, or pneumothorax. Chronic interstitial scarring unchanged. No acute bony abnormalities. IMPRESSION: 1. Stable exam, no acute process. Electronically Signed   By: Randa Ngo M.D.   On: 05/21/2020 22:27   CT Angio Chest PE W and/or Wo Contrast  Result Date: 05/18/2020 CLINICAL DATA:  History of COVID, shortness of breath, temperature EXAM: CT ANGIOGRAPHY CHEST WITH CONTRAST TECHNIQUE: Multidetector CT imaging of the chest was performed using the standard protocol during bolus administration of intravenous contrast. Multiplanar CT image reconstructions and MIPs were obtained to evaluate the vascular anatomy. CONTRAST:  16mL  OMNIPAQUE IOHEXOL 350 MG/ML SOLN COMPARISON:  None. FINDINGS: Cardiovascular: Satisfactory opacification of the pulmonary arteries to the segmental level. No evidence of pulmonary embolism. Normal heart size. No pericardial effusion. Aortic atherosclerosis. Mediastinum/Nodes: No enlarged mediastinal, hilar, or axillary lymph nodes. Thyroid gland, trachea, and esophagus demonstrate no significant findings. Lungs/Pleura: Lungs are clear. No pleural effusion or pneumothorax. Upper Abdomen: No acute abnormality. Musculoskeletal: No chest wall abnormality. No acute or significant osseous findings. Review of the MIP images confirms the above findings. IMPRESSION: 1. Negative examination for pulmonary embolism. 2. Aortic Atherosclerosis (ICD10-I70.0). Electronically Signed   By: Eddie Candle M.D.   On: 05/18/2020 11:14   DG Chest Port 1 View  Result Date: 05/20/2020 CLINICAL DATA:  Shortness of breath EXAM: PORTABLE CHEST 1 VIEW COMPARISON:  CT 05/18/2020 FINDINGS: Chronically coarsened interstitial changes and bronchitic features are similar to priors with some mild basilar atelectasis with low volumes and central vascular crowding. No new focal consolidative opacity is seen. No convincing features of edema. No pneumothorax or effusion. The aorta is calcified. The remaining cardiomediastinal contours are unremarkable. No acute osseous or soft tissue abnormality. Telemetry leads and nasal cannula overlie the chest. IMPRESSION: Chronically coarsened interstitial changes and bronchitic features, similar to priors. No acute cardiopulmonary abnormality. Electronically Signed   By: Lovena Le M.D.   On: 05/20/2020 04:35   DG Chest Portable 1 View  Result Date: 05/18/2020 CLINICAL DATA:  69 year old female with a history of smoking and bronchitis EXAM: PORTABLE CHEST 1 VIEW COMPARISON:  No comparison available FINDINGS: Cardiomediastinal silhouette within normal limits. Calcifications of the aortic arch. No  pneumothorax.  No pleural effusion. Coarsened interstitial markings.  No confluent airspace disease. No displaced fracture IMPRESSION: Likely chronic lung changes without evidence of acute cardiopulmonary disease. Electronically Signed   By: Corrie Mckusick D.O.   On: 05/18/2020 09:42   ECHOCARDIOGRAM COMPLETE  Result Date: 05/22/2020    ECHOCARDIOGRAM REPORT   Patient Name:   Sabrina Hudson Date of Exam: 05/22/2020 Medical Rec #:  [id number]         Height:       67.0 in Accession #:    [id number]        Weight:       182.5 lb Date of Birth:  1951/01/31  BSA:          1.945 m Patient Age:    89 years          BP:           154/96 mmHg Patient Gender: F                 HR:           71 bpm. Exam Location:  [facility] Procedure: 2D Echo, Cardiac Doppler, Color Doppler and Intracardiac            Opacification Agent Indications:     Dyspnea 786.09  History:         Patient has no prior history of Echocardiogram examinations.                  Risk Factors:Hypertension.  Sonographer:     Sherrie Sport RDCS (AE) Referring Phys:  [id number] BRENDA MORRISON Diagnosing Phys: Kate Sable MD IMPRESSIONS  1. Left ventricular ejection fraction, by estimation, is 50 to 55%. The left ventricle has low normal function. The left ventricle has no regional wall motion abnormalities. Left ventricular diastolic function could not be evaluated.  2. Right ventricular systolic function is normal. The right ventricular size is normal. There is normal pulmonary artery systolic pressure.  3. The mitral valve is normal in structure. No evidence of mitral valve regurgitation. No evidence of mitral stenosis.  4. The aortic valve is grossly normal. Aortic valve regurgitation is not visualized. No aortic stenosis is present.  5. Pulmonic valve regurgitation not assessed.  6. The inferior vena cava is normal in size with greater than 50% respiratory variability, suggesting right atrial pressure of 3 mmHg. FINDINGS  Left Ventricle: Left  ventricular ejection fraction, by estimation, is 50 to 55%. The left ventricle has low normal function. The left ventricle has no regional wall motion abnormalities. Definity contrast agent was given IV to delineate the left ventricular endocardial borders. The left ventricular internal cavity size was normal in size. There is no left ventricular hypertrophy. Left ventricular diastolic function could not be evaluated. Right Ventricle: The right ventricular size is normal. No increase in right ventricular wall thickness. Right ventricular systolic function is normal. There is normal pulmonary artery systolic pressure. The tricuspid regurgitant velocity is 1.85 m/s, and  with an assumed right atrial pressure of 3 mmHg, the estimated right ventricular systolic pressure is 123456 mmHg. Left Atrium: Left atrial size was normal in size. Right Atrium: Right atrial size was normal in size. Pericardium: There is no evidence of pericardial effusion. Mitral Valve: The mitral valve is normal in structure. Normal mobility of the mitral valve leaflets. No evidence of mitral valve regurgitation. No evidence of mitral valve stenosis. Tricuspid Valve: The tricuspid valve is grossly normal. Tricuspid valve regurgitation is not demonstrated. No evidence of tricuspid stenosis. Aortic Valve: The aortic valve is grossly normal. Aortic valve regurgitation is not visualized. No aortic stenosis is present. Aortic valve mean gradient measures 2.0 mmHg. Aortic valve peak gradient measures 3.4 mmHg. Aortic valve area, by VTI measures 1.85 cm. Pulmonic Valve: The pulmonic valve was not well visualized. Pulmonic valve regurgitation not assessed. No evidence of pulmonic stenosis. Aorta: The aortic root is normal in size and structure and the aortic root was not well visualized. Venous: The inferior vena cava is normal in size with greater than 50% respiratory variability, suggesting right atrial pressure of 3 mmHg. IAS/Shunts: No atrial level  shunt detected by color flow Doppler.  LEFT  VENTRICLE PLAX 2D LVIDd:         3.54 cm LVIDs:         2.61 cm LV PW:         1.29 cm LV IVS:        1.24 cm LVOT diam:     2.00 cm LV SV:         32 LV SV Index:   17 LVOT Area:     3.14 cm  RIGHT VENTRICLE RV Basal diam:  3.72 cm RV S prime:     10.90 cm/s TAPSE (M-mode): 2.7 cm LEFT ATRIUM             Index       RIGHT ATRIUM           Index LA diam:        3.40 cm 1.75 cm/m  RA Area:     11.50 cm LA Vol (A2C):   64.6 ml 33.21 ml/m RA Volume:   24.70 ml  12.70 ml/m LA Vol (A4C):   49.5 ml 25.45 ml/m LA Biplane Vol: 59.1 ml 30.38 ml/m  AORTIC VALVE                   PULMONIC VALVE AV Area (Vmax):    2.30 cm    PV Vmax:        0.72 m/s AV Area (Vmean):   2.22 cm    PV Peak grad:   2.1 mmHg AV Area (VTI):     1.85 cm    RVOT Peak grad: 3 mmHg AV Vmax:           91.85 cm/s AV Vmean:          65.350 cm/s AV VTI:            0.175 m AV Peak Grad:      3.4 mmHg AV Mean Grad:      2.0 mmHg LVOT Vmax:         67.20 cm/s LVOT Vmean:        46.200 cm/s LVOT VTI:          0.103 m LVOT/AV VTI ratio: 0.59  AORTA Ao Root diam: 2.40 cm MITRAL VALVE               TRICUSPID VALVE MV Area (PHT): 3.27 cm    TR Peak grad:   13.7 mmHg MV Decel Time: 232 msec    TR Vmax:        185.00 cm/s MV E velocity: 90.70 cm/s                            SHUNTS                            Systemic VTI:  0.10 m                            Systemic Diam: 2.00 cm Kate Sable MD Electronically signed by Kate Sable MD Signature Date/Time: 05/22/2020/2:14:35 PM    Final        Subjective: Patient feels much better today.  Able to ambulate and oxygen saturation 92%.  She states her shortness of breath and cough has improved.  Denies further wheezing.  Discharge Exam: Vitals:   05/23/20 1300 05/23/20 1517  BP:    Pulse:  Resp: 14   Temp:    SpO2: 94% 95%   Vitals:   05/23/20 0700 05/23/20 0827 05/23/20 1300 05/23/20 1517  BP: (!) 151/70     Pulse: 75     Resp: 16  14    Temp: 98 F (36.7 C)     TempSrc: Oral     SpO2:  93% 94% 95%  Weight:      Height:        General: Pt is alert, awake, not in acute distress Cardiovascular: RRR, S1/S2 +, no rubs, no gallops Respiratory: CTA bilaterally, no wheezing, no rhonchi, no wheezing Abdominal: Soft, NT, ND, bowel sounds + Extremities: no edema, no cyanosis    The results of significant diagnostics from this hospitalization (including imaging, microbiology, ancillary and laboratory) are listed below for reference.     Microbiology: Recent Results (from the past 240 hour(s))  SARS Coronavirus 2 by RT PCR (hospital order, performed in [facility] hospital lab) Nasopharyngeal Nasopharyngeal Swab     Status: None   Collection Time: 05/18/20  9:18 AM   Specimen: Nasopharyngeal Swab  Result Value Ref Range Status   SARS Coronavirus 2 NEGATIVE NEGATIVE Final    Comment: (NOTE) SARS-CoV-2 target nucleic acids are NOT DETECTED. The SARS-CoV-2 RNA is generally detectable in upper and lower respiratory specimens during the acute phase of infection. The lowest concentration of SARS-CoV-2 viral copies this assay can detect is 250 copies / mL. A negative result does not preclude SARS-CoV-2 infection and should not be used as the sole basis for treatment or other patient management decisions.  A negative result may occur with improper specimen collection / handling, submission of specimen other than nasopharyngeal swab, presence of viral mutation(s) within the areas targeted by this assay, and inadequate number of viral copies (<250 copies / mL). A negative result must be combined with clinical observations, patient history, and epidemiological information. Fact Sheet for Patients:   StrictlyIdeas.no Fact Sheet for Healthcare Providers: BankingDealers.co.za This test is not yet approved or cleared  by the [place] FDA and has been authorized for detection  and/or diagnosis of SARS-CoV-2 by FDA under an Emergency Use Authorization (EUA).  This EUA will remain in effect (meaning this test can be used) for the duration of the COVID-19 declaration under Section 564(b)(1) of the Act, 21 U.S.C. section 360bbb-3(b)(1), unless the authorization is terminated or revoked sooner. Performed at [facility], [address] [place], [place] [address]      Labs: BNP (last 3 results) Recent Labs    05/21/20 2209  BNP [id number]*   Basic Metabolic Panel: Recent Labs  Lab 05/18/20 0918 05/20/20 0410 05/21/20 0528 05/22/20 0641  NA 139 141 141 138  K 3.5 3.0* 4.7 4.0  CL 104 105 107 100  CO2 25 28 28 29   GLUCOSE 99 99 147* 93  BUN 11 13 18 20   CREATININE 0.64 0.67 0.71 0.77  CALCIUM 9.0 9.1 9.4 9.2  MG  --   --  2.2 2.5*   Liver Function Tests: Recent Labs  Lab 05/18/20 0918  AST 17  ALT 15  ALKPHOS 88  BILITOT 0.7  PROT 6.9  ALBUMIN 4.1   No results for input(s): LIPASE, AMYLASE in the last 168 hours. No results for input(s): AMMONIA in the last 168 hours. CBC: Recent Labs  Lab 05/18/20 0918 05/20/20 0410 05/22/20 0641 05/23/20 0419  WBC 5.5 8.0 13.7* 11.3*  NEUTROABS  --  4.8  --   --  HGB 13.8 16.0* 15.6* 14.6  HCT 41.3 47.8* 45.8 43.9  MCV 93.0 93.0 91.8 94.4  PLT 208 246 228 228   Cardiac Enzymes: No results for input(s): CKTOTAL, CKMB, CKMBINDEX, TROPONINI in the last 168 hours. BNP: Invalid input(s): POCBNP CBG: Recent Labs  Lab 05/22/20 1157 05/22/20 1647 05/22/20 2119 05/23/20 0817 05/23/20 1407  GLUCAP 104* 117* 115* 65* 132*   D-Dimer No results for input(s): DDIMER in the last 72 hours. Hgb A1c Recent Labs    05/21/20 2209  HGBA1C 5.8*   Lipid Profile No results for input(s): CHOL, HDL, LDLCALC, TRIG, CHOLHDL, LDLDIRECT in the last 72 hours. Thyroid function studies Recent Labs    05/21/20 2209  TSH 0.763   Anemia work up No results for input(s): VITAMINB12, FOLATE, FERRITIN,  TIBC, IRON, RETICCTPCT in the last 72 hours. Urinalysis    Component Value Date/Time   COLORURINE YELLOW (A) 02/05/2020 1257   APPEARANCEUR CLEAR (A) 02/05/2020 1257   APPEARANCEUR Hazy 02/26/2014 1833   LABSPEC 1.012 02/05/2020 1257   LABSPEC 1.019 02/26/2014 1833   PHURINE 5.0 02/05/2020 1257   GLUCOSEU NEGATIVE 02/05/2020 1257   GLUCOSEU Negative 02/26/2014 1833   HGBUR NEGATIVE 02/05/2020 1257   BILIRUBINUR NEGATIVE 02/05/2020 1257   BILIRUBINUR Negative 02/26/2014 1833   KETONESUR NEGATIVE 02/05/2020 1257   PROTEINUR NEGATIVE 02/05/2020 1257   NITRITE NEGATIVE 02/05/2020 1257   LEUKOCYTESUR TRACE (A) 02/05/2020 1257   LEUKOCYTESUR 3+ 02/26/2014 1833   Sepsis Labs Invalid input(s): PROCALCITONIN,  WBC,  LACTICIDVEN Microbiology Recent Results (from the past 240 hour(s))  SARS Coronavirus 2 by RT PCR (hospital order, performed in [facility] hospital lab) Nasopharyngeal Nasopharyngeal Swab     Status: None   Collection Time: 05/18/20  9:18 AM   Specimen: Nasopharyngeal Swab  Result Value Ref Range Status   SARS Coronavirus 2 NEGATIVE NEGATIVE Final    Comment: (NOTE) SARS-CoV-2 target nucleic acids are NOT DETECTED. The SARS-CoV-2 RNA is generally detectable in upper and lower respiratory specimens during the acute phase of infection. The lowest concentration of SARS-CoV-2 viral copies this assay can detect is 250 copies / mL. A negative result does not preclude SARS-CoV-2 infection and should not be used as the sole basis for treatment or other patient management decisions.  A negative result may occur with improper specimen collection / handling, submission of specimen other than nasopharyngeal swab, presence of viral mutation(s) within the areas targeted by this assay, and inadequate number of viral copies (<250 copies / mL). A negative result must be combined with clinical observations, patient history, and epidemiological information. Fact Sheet for Patients:    StrictlyIdeas.no Fact Sheet for Healthcare Providers: BankingDealers.co.za This test is not yet approved or cleared  by the [place] FDA and has been authorized for detection and/or diagnosis of SARS-CoV-2 by FDA under an Emergency Use Authorization (EUA).  This EUA will remain in effect (meaning this test can be used) for the duration of the COVID-19 declaration under Section 564(b)(1) of the Act, 21 U.S.C. section 360bbb-3(b)(1), unless the authorization is terminated or revoked sooner. Performed at [facility], [address], [place], [place] [address]      Time coordinating discharge: Over 30 minutes  SIGNED:   Nolberto Hanlon, MD  [facility] 05/23/2020, 3:20 PM Pager   If 7PM-7AM, please contact night-coverage [email or web address] Password [email or web address]

## 2020-05-23 NOTE — Progress Notes (Signed)
Progress Note  Patient Name: Sabrina Hudson Date of Encounter: 05/23/2020  Primary Cardiologist: Kate Sable, MD  Subjective   No chest pain.  Breathing has returned to post-covid baseline.  Still not back to pre-covid state.  Maintaining sinus rhythm.  Inpatient Medications    Scheduled Meds: . apixaban  5 mg Oral BID  . diltiazem  120 mg Oral Daily  . docusate sodium  100 mg Oral Daily  . DULoxetine  60 mg Oral Daily  . guaiFENesin  600 mg Oral BID  . hydrochlorothiazide  12.5 mg Oral Daily   And  . irbesartan  75 mg Oral Daily  . insulin aspart  0-15 Units Subcutaneous TID AC & HS  . ipratropium-albuterol  3 mL Nebulization Q6H  . meloxicam  7.5 mg Oral Daily  . methylPREDNISolone (SOLU-MEDROL) injection  40 mg Intravenous Q24H  . pantoprazole  40 mg Oral Daily  . senna  1 tablet Oral Daily   Continuous Infusions: . azithromycin Stopped (05/22/20 1828)   PRN Meds: benzonatate, fluticasone, HYDROcodone-acetaminophen, ipratropium, levalbuterol   Vital Signs    Vitals:   05/23/20 0028 05/23/20 0500 05/23/20 0700 05/23/20 0827  BP: 134/70  (!) 151/70   Pulse: 70  75   Resp: 16  16   Temp: 97.6 F (36.4 C)  98 F (36.7 C)   TempSrc: Oral  Oral   SpO2: 94%   93%  Weight:  83 kg    Height:        Intake/Output Summary (Last 24 hours) at 05/23/2020 1055 Last data filed at 05/23/2020 0945 Gross per 24 hour  Intake 1450 ml  Output -  Net 1450 ml   Filed Weights   05/20/20 0407 05/22/20 0500 05/23/20 0500  Weight: 80 kg 82.8 kg 83 kg    Physical Exam   GEN: Well nourished, well developed, in no acute distress.  HEENT: Grossly normal.  Neck: Supple, no JVD, carotid bruits, or masses. Cardiac: RRR, no murmurs, rubs, or gallops. No clubbing, cyanosis, edema.  Radials/DP/PT 2+ and equal bilaterally.  Respiratory:  Respirations regular and unlabored, markedly diminished breath sounds bilaterally. GI: Soft, nontender, nondistended, BS + x 4. MS: no  deformity or atrophy. Skin: warm and dry, no rash. Neuro:  Strength and sensation are intact. Psych: AAOx3.  Normal affect.  Labs    Chemistry Recent Labs  Lab 05/18/20 [id number] 05/18/20 0918 05/20/20 0410 05/21/20 0528 05/22/20 0641  NA 139   < > 141 141 138  K 3.5   < > 3.0* 4.7 4.0  CL 104   < > 105 107 100  CO2 25   < > 28 28 29   GLUCOSE 99   < > 99 147* 93  BUN 11   < > 13 18 20   CREATININE 0.64   < > 0.67 0.71 0.77  CALCIUM 9.0   < > 9.1 9.4 9.2  PROT 6.9  --   --   --   --   ALBUMIN 4.1  --   --   --   --   AST 17  --   --   --   --   ALT 15  --   --   --   --   ALKPHOS 88  --   --   --   --   BILITOT 0.7  --   --   --   --   GFRNONAA >60   < > >60 >60 >60  GFRAA >60   < > >60 >60 >60  ANIONGAP 10   < > 8 6 9    < > = values in this interval not displayed.     Hematology Recent Labs  Lab 05/20/20 0410 05/22/20 0641 05/23/20 0419  WBC 8.0 13.7* 11.3*  RBC 5.14* 4.99 4.65  HGB 16.0* 15.6* 14.6  HCT 47.8* 45.8 43.9  MCV 93.0 91.8 94.4  MCH 31.1 31.3 31.4  MCHC 33.5 34.1 33.3  RDW 13.1 13.7 13.5  PLT 246 228 228    Cardiac Enzymes  Recent Labs  Lab 05/20/20 0410 05/21/20 2209 05/22/20 0047  TROPONINIHS 6 8 8       BNP Recent Labs  Lab 05/21/20 2209  BNP 141.0*     Radiology    DG Chest 1 View  Result Date: 05/21/2020 CLINICAL DATA:  Wheezing, tachycardia EXAM: CHEST  1 VIEW COMPARISON:  05/20/2020 FINDINGS: Single frontal view of the chest demonstrates a stable cardiac silhouette. No acute airspace disease, effusion, or pneumothorax. Chronic interstitial scarring unchanged. No acute bony abnormalities. IMPRESSION: 1. Stable exam, no acute process. Electronically Signed   By: Randa Ngo M.D.   On: 05/21/2020 22:27   DG Chest Port 1 View  Result Date: 05/20/2020 CLINICAL DATA:  Shortness of breath EXAM: PORTABLE CHEST 1 VIEW COMPARISON:  CT 05/18/2020 FINDINGS: Chronically coarsened interstitial changes and bronchitic features are similar to  priors with some mild basilar atelectasis with low volumes and central vascular crowding. No new focal consolidative opacity is seen. No convincing features of edema. No pneumothorax or effusion. The aorta is calcified. The remaining cardiomediastinal contours are unremarkable. No acute osseous or soft tissue abnormality. Telemetry leads and nasal cannula overlie the chest. IMPRESSION: Chronically coarsened interstitial changes and bronchitic features, similar to priors. No acute cardiopulmonary abnormality. Electronically Signed   By: Lovena Le M.D.   On: 05/20/2020 04:35    Telemetry    RSR, PACs - Personally Reviewed  Cardiac Studies   2D Echocardiogram 5.26.2021   1. Left ventricular ejection fraction, by estimation, is 50 to 55%. The  left ventricle has low normal function. The left ventricle has no regional  wall motion abnormalities. Left ventricular diastolic function could not  be evaluated.   2. Right ventricular systolic function is normal. The right ventricular  size is normal. There is normal pulmonary artery systolic pressure.   3. The mitral valve is normal in structure. No evidence of mitral valve  regurgitation. No evidence of mitral stenosis.   4. The aortic valve is grossly normal. Aortic valve regurgitation is not  visualized. No aortic stenosis is present.   5. Pulmonic valve regurgitation not assessed.   6. The inferior vena cava is normal in size with greater than 50%  respiratory variability, suggesting right atrial pressure of 3 mmHg.    Patient Profile     69 y.o. 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  Assessment & Plan    1. PAF: Developed during this hospitalization.  Converted to sinus rhythm ~ 11am on 5.26.  Maintaining sinus rhythm on long acting dilt. Echo w/ EF of  50-55%. No significant valvular abnormalities. CHA2DS2VASc = 3-4 (HTN, Age 89, Ao atherosclerosis on CT, Female).  2.  AECOPD/Tob Abuse:  Quit smoking post-COVID infection in Jan.  Breathing improved since admission.  Feels she is back to post-COVID baseline.  Steroids/nebs per IM.  3.  Essential HTN:  BPs 130's yesterday. Higher this AM.  Follow after AM meds (dilt/irbesartan/hctz).  May need to titrate irbesartan-hctz.  4.  Dispo: f/u w/ Dr. Garen Lah in ~ 2-3 wks.  Signed, Murray Hodgkins, NP  05/23/2020, 10:55 AM    For questions or updates, please contact   Please consult [email or web address] for contact info under Cardiology/STEMI.

## 2020-05-23 NOTE — TOC Transition Note (Addendum)
Transition of Care [facility]) - CM/SW Discharge Note   Patient Details  Name: DERITA KALMBACH MRN: [id number] Date of Birth: October 09, 1951  Transition of Care [facility]) CM/SW Contact:  Elease Hashimoto, LCSW Phone Number: 05/23/2020, 3:28 PM   Clinical Narrative:   Pt is ready to go home and plans to go with sister who can be there with her. She has her rw and nebs machine from [employer]. No follow up needed. Pt hopes to be able to stay at home and not have to come back for her breathing issues.No further follow due to DC today. Neb machine and rw taken home already    Final next level of care: Home/Self Care Barriers to Discharge: Barriers Resolved   Patient Goals and CMS Choice Patient states their goals for this hospitalization and ongoing recovery are:: I am staying until tomorrow according to the doctor. I do feel better CMS Medicare.gov Compare Post Acute Care list provided to:: Patient Choice offered to / list presented to : Patient  Discharge Placement                Patient to be transferred to facility by: HOme via care by sister Name of family member notified: Deborah-sister Patient and family notified of of transfer: 05/23/20  Discharge Plan and Services In-house Referral: Clinical Social Work   Post Acute Care Choice: Durable Medical Equipment          DME Arranged: Chiropodist, Walker rolling DME Agency: [employer] Date DME Agency Contacted: 05/21/20 Time DME Agency Contacted: [phone] Representative spoke with at DME Agency: [place] ([place]) Interventions     Readmission Risk Interventions No flowsheet data found.

## 2020-05-23 NOTE — Progress Notes (Signed)
End of Shift Summary:  Date: 5/26-5/27 Shift: 2300-0700 Ambulatory: up with standby assist Significant Events: Pt remained in NSR throughout shift with no complaints. No c/o labored or difficulty breathing

## 2020-05-28 ENCOUNTER — Inpatient Hospital Stay: Admission: RE | Admit: 2020-05-28 | Payer: Medicare Other | Source: Ambulatory Visit

## 2020-05-28 ENCOUNTER — Other Ambulatory Visit: Admission: RE | Admit: 2020-05-28 | Payer: Medicare Other | Source: Ambulatory Visit

## 2020-06-03 DIAGNOSIS — I4891 Unspecified atrial fibrillation: Secondary | ICD-10-CM | POA: Insufficient documentation

## 2020-06-20 DIAGNOSIS — R001 Bradycardia, unspecified: Secondary | ICD-10-CM | POA: Insufficient documentation

## 2020-06-28 ENCOUNTER — Encounter
Admission: RE | Admit: 2020-06-28 | Discharge: 2020-06-28 | Disposition: A | Discharge status: Home / Self Care | Payer: Medicare Other | Source: Ambulatory Visit | Attending: Orthopedic Surgery | Admitting: Orthopedic Surgery

## 2020-06-28 ENCOUNTER — Other Ambulatory Visit: Payer: Self-pay

## 2020-06-28 HISTORY — DX: Unspecified osteoarthritis, unspecified site: M19.90

## 2020-06-28 HISTORY — DX: Chronic obstructive pulmonary disease, unspecified: J44.9

## 2020-06-28 NOTE — Patient Instructions (Signed)
Your procedure is scheduled on: Tuesday July 09, 2020. Report to Day Surgery inside [place] 2nd floor. To find out your arrival time please call [phone] between 1PM - 3PM on Monday July 08, 2020.  Remember: Instructions that are not followed completely may result in serious medical risk,  up to and including death, or upon the discretion of your surgeon and anesthesiologist your  surgery may need to be rescheduled.     _X__ 1. Do not eat food after midnight the night before your procedure.                 No gum chewing or hard candies. You may drink clear liquids up to 2 hours                 before you are scheduled to arrive for your surgery- DO not drink clear                 liquids within 2 hours of the start of your surgery.                 Clear Liquids include:  water, apple juice without pulp, clear Gatorade, G2 or                  Gatorade Zero (avoid Red/Purple/Blue), Black Coffee or Tea (Do not add                 anything to coffee or tea).  __X__2.Complete the carbohydrate drink provided to you, 2 hours before arrival.  __X__3.  On the morning of surgery brush your teeth with toothpaste and water, you                may rinse your mouth with mouthwash if you wish.  Do not swallow any toothpaste of mouthwash.     _X__ 4.  No Alcohol for 24 hours before or after surgery.   _X__ 5.  Do Not Smoke or use e-cigarettes For 24 Hours Prior to Your Surgery.                 Do not use any chewable tobacco products for at least 6 hours prior to                 Surgery.  _X__  6.  Do not use any recreational drugs (marijuana, cocaine, heroin, ecstacy, MDMA or other)                For at least one week prior to your surgery.  Combination of these drugs with anesthesia                May have life threatening results.  __X__ 7.  Notify your doctor if there is any change in your medical condition      (cold, fever, infections).     Do not wear  jewelry, make-up, hairpins, clips or nail polish. Do not wear lotions, powders, or perfumes. You may wear deodorant. Do not shave 48 hours prior to surgery. Men may shave face and neck. Do not bring valuables to the hospital.    [facility] is not responsible for any belongings or valuables.  Contacts, dentures or bridgework may not be worn into surgery. Leave your suitcase in the car. After surgery it may be brought to your room. For patients admitted to the hospital, discharge time is determined by your treatment team.   Patients discharged the day of surgery will not be  allowed to drive home.   Make arrangements for someone to be with you for the first 24 hours of your Same Day Discharge.  Please read over the following fact sheets that you were given:   Total Joint Packet   __X__ Take these medicines the morning of surgery with A SIP OF WATER:    1. omeprazole (PRILOSEC) 40 MG  2. metoprolol tartrate (LOPRESSOR) 25  3. diltiazem (CARDIZEM CD) 180 MG    __X__ Use CHG Soap as directed  __X__ Use inhalers on the day of surgery  ipratropium-albuterol (DUONEB) 0.5-2.5 (3) MG/3ML SOLN  __X__ Stop apixaban (ELIQUIS) 5 MG as instructed by your doctor. ( last dose Friday July 9)  __X__ Stop Anti-inflammatories such ibuprofen, Aleve, Advil, naproxen, aspirin and or BC powders.    __X__ Stop supplements until after surgery.    __X__ Do not start any herbal supplements before your surgery.

## 2020-07-05 ENCOUNTER — Encounter
Admission: RE | Admit: 2020-07-05 | Discharge: 2020-07-05 | Disposition: A | Discharge status: Home / Self Care | Payer: Medicare Other | Source: Ambulatory Visit | Attending: Orthopedic Surgery | Admitting: Orthopedic Surgery

## 2020-07-05 ENCOUNTER — Encounter: Payer: Self-pay | Admitting: Orthopedic Surgery

## 2020-07-05 ENCOUNTER — Other Ambulatory Visit: Payer: Self-pay

## 2020-07-05 DIAGNOSIS — Z01812 Encounter for preprocedural laboratory examination: Secondary | ICD-10-CM | POA: Diagnosis not present

## 2020-07-05 DIAGNOSIS — Z20822 Contact with and (suspected) exposure to covid-19: Secondary | ICD-10-CM | POA: Diagnosis not present

## 2020-07-05 LAB — CBC WITH DIFFERENTIAL/PLATELET
Abs Immature Granulocytes: 0.03 10*3/uL (ref 0.00–0.07)
Basophils Absolute: 0.1 10*3/uL (ref 0.0–0.1)
Basophils Relative: 2 %
Eosinophils Absolute: 0.5 10*3/uL (ref 0.0–0.5)
Eosinophils Relative: 6 %
HCT: 42 % (ref 36.0–46.0)
Hemoglobin: 14 g/dL (ref 12.0–15.0)
Immature Granulocytes: 0 %
Lymphocytes Relative: 27 %
Lymphs Abs: 2.4 10*3/uL (ref 0.7–4.0)
MCH: 30.9 pg (ref 26.0–34.0)
MCHC: 33.3 g/dL (ref 30.0–36.0)
MCV: 92.7 fL (ref 80.0–100.0)
Monocytes Absolute: 0.7 10*3/uL (ref 0.1–1.0)
Monocytes Relative: 8 %
Neutro Abs: 5.1 10*3/uL (ref 1.7–7.7)
Neutrophils Relative %: 57 %
Platelets: 286 10*3/uL (ref 150–400)
RBC: 4.53 MIL/uL (ref 3.87–5.11)
RDW: 13.1 % (ref 11.5–15.5)
WBC: 8.9 10*3/uL (ref 4.0–10.5)
nRBC: 0 % (ref 0.0–0.2)

## 2020-07-05 LAB — COMPREHENSIVE METABOLIC PANEL
ALT: 20 U/L (ref 0–44)
AST: 21 U/L (ref 15–41)
Albumin: 3.9 g/dL (ref 3.5–5.0)
Alkaline Phosphatase: 91 U/L (ref 38–126)
Anion gap: 11 (ref 5–15)
BUN: 14 mg/dL (ref 8–23)
CO2: 25 mmol/L (ref 22–32)
Calcium: 9.1 mg/dL (ref 8.9–10.3)
Chloride: 104 mmol/L (ref 98–111)
Creatinine, Ser: 0.82 mg/dL (ref 0.44–1.00)
GFR calc Af Amer: >60 mL/min (ref >60)
GFR calc non Af Amer: >60 mL/min (ref >60)
Glucose, Bld: 88 mg/dL (ref 70–99)
Potassium: 4.1 mmol/L (ref 3.5–5.1)
Sodium: 140 mmol/L (ref 135–145)
Total Bilirubin: 0.8 mg/dL (ref 0.3–1.2)
Total Protein: 6.9 g/dL (ref 6.5–8.1)

## 2020-07-05 LAB — URINALYSIS, ROUTINE W REFLEX MICROSCOPIC
Bilirubin Urine: NEGATIVE
Glucose, UA: NEGATIVE mg/dL
Ketones, ur: NEGATIVE mg/dL
Nitrite: NEGATIVE
Protein, ur: NEGATIVE mg/dL
Specific Gravity, Urine: 1.011 (ref 1.005–1.030)
pH: 6 (ref 5.0–8.0)

## 2020-07-05 LAB — SURGICAL PCR SCREEN
MRSA, PCR: NEGATIVE
Staphylococcus aureus: NEGATIVE

## 2020-07-05 LAB — TYPE AND SCREEN
ABO/RH(D): O POS
Antibody Screen: NEGATIVE

## 2020-07-05 NOTE — Progress Notes (Signed)
[facility] Perioperative Services  Pre-Admission/Anesthesia Testing Clinical Review  Date: 07/05/20  Patient Demographics:  Name: Sabrina Hudson DOB:   09-11-1951 MRN:   [id number]  Planned Surgical Procedure(s):    Case: [id number] Date/Time: 07/09/20 0943   Procedure: RIGHT TOTAL HIP ARTHROPLASTY ANTERIOR APPROACH (Right Hip)   Anesthesia type: Choice   Pre-op diagnosis: Primary osteoarthritis of right hip   Location: [facility] OR ROOM 01 / [place] ORS FOR ANESTHESIA GROUP   Surgeons: Hessie Knows, MD     NOTE: Available PAT nursing documentation and vital signs have been reviewed. Clinical nursing staff has updated patient's PMH/PSHx, current medication list, and drug allergies/intolerances to ensure comprehensive history available to assist in medical decision making as it pertains to the aforementioned surgical procedure and anticipated anesthetic course.   Clinical Discussion:  Sabrina Hudson is a 69 y.o. female who is submitted for pre-surgical anesthesia review and clearance prior to her undergoing the above procedure. Patient is a Former Smoker (22.5 pack years; quit 04/2020). Pertinent PMH includes: PAF, HTN, COPD (no supplemental O2 requirements), GERD (on daily PPI), Anxiety.  Patient admitted to [facility] from 05/20/2020 - 05/23/2020 for AEOPD; notes received. Had SARS-CoV-2 (novel coronavirus) in 12/2019. Went into A.fib during admission; cardiology was consulted. CHA2DS2-VASc score was 3. Patient started on antiarrythmic and anticoagulation. She was discharged home with instructions for outpatient follow up with cardiology. DME required at time of discharge included nebulizer machine and walker.   Patient is followed by cardiology Nehemiah Massed, MD). She was last seen in the cardiology clinic on 06/20/2020 notes reviewed. Patient noted to be doing well overall. She denies chest pain and shortness of breath. She complained of intermittent palpitations, however  overall, her PAF was well managed on diltiazem and metoprolol. MD aware of planned orthopedic procedure; risk/benefits weighed. Functional capacity documented as being > 4 METS. Per cardiology, patient should "proceed to surgery and/or invasive procedure without restrictions. The patient is at the lowest risk possible for cardiovascular complication (<7%)". This patient is on daily anticoagulation therapy. She has been instructed on recommendations for holding her apixaban for 3 days prior to her procedure. The patient has been instructed that her last dose of her anticoagulant will be on 07/05/2020.  She denies previous intra-operative complications with anesthesia.   Vitals with BMI 06/28/2020 05/23/2020 05/23/2020  Height 5\' 7"  - -  Weight 185 lbs - -  BMI 67.34 - -  Systolic - 193 790  Diastolic - 59 70  Pulse - 80 75    Providers/Specialists:   PROVIDER ROLE LAST Fabio Bering, MD Orthopedics (Surgeon) 06/28/2020  Derinda Late, MD Primary Care Provider 06/04/2020  Serafina Royals, MD Cardiology 06/20/2020   Allergies:  Patient has no known allergies.  Current Home Medications:   No current facility-administered medications for this encounter.   Marland Kitchen acetaminophen (TYLENOL) 500 MG tablet  . DULoxetine (CYMBALTA) 60 MG capsule  . fluticasone (FLONASE) 50 MCG/ACT nasal spray  . HYDROcodone-acetaminophen (NORCO) 5-325 MG tablet  . omeprazole (PRILOSEC) 40 MG capsule  . valsartan-hydrochlorothiazide (DIOVAN-HCT) 80-12.5 MG tablet  . albuterol (VENTOLIN HFA) 108 (90 Base) MCG/ACT inhaler  . apixaban (ELIQUIS) 5 MG TABS tablet  . benzonatate (TESSALON) 100 MG capsule  . diltiazem (CARDIZEM CD) 120 MG 24 hr capsule  . diltiazem (CARDIZEM CD) 180 MG 24 hr capsule  . diltiazem (CARDIZEM) 30 MG tablet  . ipratropium-albuterol (DUONEB) 0.5-2.5 (3) MG/3ML SOLN  . lidocaine (LIDODERM) 5 %  . metoprolol tartrate (LOPRESSOR)  25 MG tablet   History:   Past Medical History:    Diagnosis Date  . Anxiety   . Arthritis   . COPD (chronic obstructive pulmonary disease) ([place])   . GERD (gastroesophageal reflux disease)   . History of 2019 novel coronavirus disease (COVID-19) 01/23/2020  . Hypertension    Past Surgical History:  Procedure Laterality Date  . ABDOMINAL HYSTERECTOMY    . CESAREAN SECTION     x 2  . COLONOSCOPY  2011  . ESOPHAGOGASTRODUODENOSCOPY ENDOSCOPY  2011  . TONSILLECTOMY     Family History  Problem Relation Age of Onset  . Breast cancer Neg Hx    Social History   Tobacco Use  . Smoking status: Former Smoker    Packs/day: 0.50    Years: 45.00    Pack years: 22.50    Types: Cigarettes    Quit date: 05/20/2020    Years since quitting: 0.1  . Smokeless tobacco: Never Used  Vaping Use  . Vaping Use: Never used  Substance Use Topics  . Alcohol use: Not Currently  . Drug use: Never    Pertinent Clinical Results:  LABS: Labs reviewed: Acceptable for surgery.  Hospital Outpatient Visit on 07/05/2020  Component Date Value Ref Range Status  . WBC 07/05/2020 8.9  4.0 - 10.5 K/uL Final  . RBC 07/05/2020 4.53  3.87 - 5.11 MIL/uL Final  . Hemoglobin 07/05/2020 14.0  12.0 - 15.0 g/dL Final  . HCT 07/05/2020 42.0  36 - 46 % Final  . MCV 07/05/2020 92.7  80.0 - 100.0 fL Final  . MCH 07/05/2020 30.9  26.0 - 34.0 pg Final  . MCHC 07/05/2020 33.3  30.0 - 36.0 g/dL Final  . RDW 07/05/2020 13.1  11.5 - 15.5 % Final  . Platelets 07/05/2020 286  150 - 400 K/uL Final  . nRBC 07/05/2020 0.0  0.0 - 0.2 % Final  . Neutrophils Relative % 07/05/2020 57  % Final  . Neutro Abs 07/05/2020 5.1  1.7 - 7.7 K/uL Final  . Lymphocytes Relative 07/05/2020 27  % Final  . Lymphs Abs 07/05/2020 2.4  0.7 - 4.0 K/uL Final  . Monocytes Relative 07/05/2020 8  % Final  . Monocytes Absolute 07/05/2020 0.7  0 - 1 K/uL Final  . Eosinophils Relative 07/05/2020 6  % Final  . Eosinophils Absolute 07/05/2020 0.5  0 - 0 K/uL Final  . Basophils Relative 07/05/2020 2  %  Final  . Basophils Absolute 07/05/2020 0.1  0 - 0 K/uL Final  . Immature Granulocytes 07/05/2020 0  % Final  . Abs Immature Granulocytes 07/05/2020 0.03  0.00 - 0.07 K/uL Final   Performed at [facility], [address] [place], [place] [address]  . Sodium 07/05/2020 140  135 - 145 mmol/L Final  . Potassium 07/05/2020 4.1  3.5 - 5.1 mmol/L Final  . Chloride 07/05/2020 104  98 - 111 mmol/L Final  . CO2 07/05/2020 25  22 - 32 mmol/L Final  . Glucose, Bld 07/05/2020 88  70 - 99 mg/dL Final   Glucose reference range applies only to samples taken after fasting for at least 8 hours.  . BUN 07/05/2020 14  8 - 23 mg/dL Final  . Creatinine, Ser 07/05/2020 0.82  0.44 - 1.00 mg/dL Final  . Calcium 07/05/2020 9.1  8.9 - 10.3 mg/dL Final  . Total Protein 07/05/2020 6.9  6.5 - 8.1 g/dL Final  . Albumin 07/05/2020 3.9  3.5 - 5.0 g/dL Final  .  AST 07/05/2020 21  15 - 41 U/L Final  . ALT 07/05/2020 20  0 - 44 U/L Final  . Alkaline Phosphatase 07/05/2020 91  38 - 126 U/L Final  . Total Bilirubin 07/05/2020 0.8  0.3 - 1.2 mg/dL Final  . GFR calc non Af Amer 07/05/2020 >60  >60 mL/min Final  . GFR calc Af Amer 07/05/2020 >60  >60 mL/min Final  . Anion gap 07/05/2020 11  5 - 15 Final   Performed at [facility], [address] [place], [place] [address]  . ABO/RH(D) 07/05/2020 PENDING   Incomplete  . Antibody Screen 07/05/2020 PENDING   Incomplete  . Sample Expiration 07/05/2020 07/19/2020,2359   Final  . Extend sample reason 07/05/2020    Final                   Value:NO TRANSFUSIONS OR PREGNANCY IN THE PAST 3 MONTHS Performed at [facility], [address] [place], [place] [address]   . Color, Urine 07/05/2020 YELLOW* YELLOW Final  . APPearance 07/05/2020 HAZY* CLEAR Final  . Specific Gravity, Urine 07/05/2020 1.011  1.005 - 1.030 Final  . pH 07/05/2020 6.0  5.0 - 8.0 Final  . Glucose, UA 07/05/2020 NEGATIVE  NEGATIVE mg/dL Final  . Hgb urine dipstick 07/05/2020  SMALL* NEGATIVE Final  . Bilirubin Urine 07/05/2020 NEGATIVE  NEGATIVE Final  . Ketones, ur 07/05/2020 NEGATIVE  NEGATIVE mg/dL Final  . Protein, ur 07/05/2020 NEGATIVE  NEGATIVE mg/dL Final  . Nitrite 07/05/2020 NEGATIVE  NEGATIVE Final  . Chalmers Guest 07/05/2020 SMALL* NEGATIVE Final  . RBC / HPF 07/05/2020 0-5  0 - 5 RBC/hpf Final  . WBC, UA 07/05/2020 0-5  0 - 5 WBC/hpf Final  . Bacteria, UA 07/05/2020 RARE* NONE SEEN Final  . Squamous Epithelial / LPF 07/05/2020 0-5  0 - 5 Final  . Mucus 07/05/2020 PRESENT   Final   Performed at [facility], [address], [place], [place] [address]  . MRSA, PCR 07/05/2020 NEGATIVE  NEGATIVE Final  . Staphylococcus aureus 07/05/2020 NEGATIVE  NEGATIVE Final   Comment: (NOTE) The Xpert SA Assay (FDA approved for NASAL specimens in patients 74 years of age and older), is one component of a comprehensive surveillance program. It is not intended to diagnose infection nor to guide or monitor treatment. Performed at [facility], [address] [place], [place] [address]     EKG: Date: 06/20/2020 Rate: 55 bpm Rhythm: sinus bradycardia Axis (leads I and aVF): Normal Intervals: PR 200 ms. QTc 434 ms. ST segment and T wave changes: No evidence of ST segment elevation or depression Comparison: Similar to previous tracing obtained on 06/05/2020 NOTE: Tracing obtained by [facility]).   IMAGING / PROCEDURES: ECHOCARDIOGRAM done on 05/22/2020 1. Left ventricular ejection fraction, by estimation, is 50 to 55%. The left ventricle has low normal function. The left ventricle has no regional wall motion abnormalities. Left ventricular diastolic function could not be evaluated.  2. Right ventricular systolic function is normal. The right ventricular size is normal. There is normal pulmonary artery systolic pressure.  3. The mitral valve is normal in structure. No evidence of mitral valve regurgitation. No evidence of mitral  stenosis.  4. The aortic valve is grossly normal. Aortic valve regurgitation is not visualized. No aortic stenosis is present.  5. Pulmonic valve regurgitation not assessed.  6. The inferior vena cava is normal in size with greater than 50% respiratory variability, suggesting right atrial pressure of  3 mmHg.   CHEST RADIOGRAPH done on 05/21/2020 1. Single frontal view of the chest demonstrates a stable cardiac silhouette.  2. No acute airspace disease, effusion, or pneumothorax. 3. Chronic interstitial scarring unchanged. No acute bony abnormalities.  Impression and Plan:  Sabrina Hudson has been referred for pre-anesthesia review and clearance prior her undergoing the planned anesthetic and procedural courses. Available labs, pertinent testing, and imaging results were personally reviewed by me. This patient has been appropriately cleared by cardiology.   Based on clinical review performed today (07/05/20), barring and significant acute changes in the patient's overall condition, it is anticipated that she will be able to proceed with the planned surgical intervention. Any acute changes in clinical condition may necessitate her procedure being postponed and/or cancelled. Pre-surgical instructions were reviewed with the patient during her PAT appointment and questions were fielded by PAT clinical staff.  Honor Loh, MSN, APRN, FNP-C, CEN [facility]  Peri-operative Services Nurse Practitioner Phone: [phone] 07/05/20 12:51 PM  NOTE: This note has been prepared using Dragon dictation software. Despite my best ability to proofread, there is always the potential that unintentional transcriptional errors may still occur from this process.

## 2020-07-06 LAB — SARS CORONAVIRUS 2 (TAT 6-24 HRS): SARS Coronavirus 2: NEGATIVE

## 2020-07-08 NOTE — H&P (Addendum)
Chief Complaint: No chief complaint on file.  Sabrina Hudson is a 69 y.o. female who presents today for history and physical for right total hip arthroplasty with Dr. Hessie Knows on 07/09/2020. She describes deep aching pain has been present for 6 months. Pain is located in her right buttocks, right lateral hip, right anterior thigh to the knee. At times she has lateral knee pain. She also complains of some occasional numbness along the lateral thigh. Symptoms are increased with walking and any activity including hip flexion such as putting on her socks and shoes, getting in and out of low vehicles and going up steps. She feels at times her hip will give way. 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 No evidence of acute bony abnormality. She was placed on meloxicam and saw some improvement. 03/21/2020, patient received injection of cortisone into the right hip joint, soft 50% relief for approximately 20 minutes and then pain returned. Over the next few days she did see some improvement in hip range of motion but she still has significant pain. She has been taking occasional Aleve or ibuprofen with some mild relief. Overall pain feels as if it is getting worse and interfering with quality of life and activities of daily living. She is having take Norco daily for severe pain. She is very active person and enjoys traveling, taking her dog for a walk as well as kayaking.  Past Medical History: Past Medical History:  Diagnosis Date  . Bilateral hip bursitis  . Bladder pain  . Bladder pain  Chronic  . Chronic back pain  . COVID-19 12/2019  . Depression  . GERD (gastroesophageal reflux disease)  . Hearing impaired person, bilateral  Speech is affected by this. Patient reading lips to understand.  . History of chickenpox  . History of  iritis  Resolved.  . Hyperlipidemia  . Hypertension  . Lumbago  . Migraine  . Polyp of colon  benign   Past Surgical History: Past Surgical History:  Procedure Laterality Date  . COLONOSCOPY 02/07/2007, 05/30/2010  . EGD 05/30/2010  . HYSTERECTOMY  total, bilat SPO  . TONSILLECTOMY & ADENOIDECTOMY  . Tubal ligation   Past Family History: Family History  Problem Relation Age of Onset  . COPD Mother  . Alzheimer's disease Father  . Parkinsonism Father  . Coronary Artery Disease (Blocked arteries around heart) Neg Hx  . Colon cancer Neg Hx  . Breast cancer Neg Hx   Medications: Current Outpatient Medications Ordered in Epic  Medication Sig Dispense Refill  . albuterol 90 mcg/actuation inhaler Inhale 2 inhalations into the lungs as needed  . apixaban (ELIQUIS) 5 mg tablet Take 1 tablet (5 mg total) by mouth every 12 (twelve) hours 60 tablet 4  . azithromycin (ZITHROMAX) 250 MG tablet TAKE 2 TABLETS BY MOUTH TODAY, THEN TAKE 1 TABLET DAILY FOR 4 DAYS  . diltiazem (CARDIZEM CD) 180 MG CD capsule Take 1 capsule (180 mg total) by mouth once daily 90 capsule 3  . diltiazem (CARDIZEM) 30 MG tablet Take 1 tablet (30 mg total) by mouth 4 (four) times daily as needed (pulse greater than 100) 120 tablet 11  . DULoxetine (CYMBALTA) 60 MG DR capsule TAKE 1 CAPSULE BY MOUTH ONCE DAILY 90 capsule 3  . fluticasone propionate (FLONASE) 50 mcg/actuation nasal spray PLACE 1 SPRAY INTO BOTH NOSTRILS 2 (TWO)  TIMES DAILY 16 g 1  . metoprolol tartrate (LOPRESSOR) 25 MG tablet Take 1 tablet (25 mg total) by mouth 2 (two) times daily 180 tablet 4  . omeprazole (PRILOSEC) 40 MG DR capsule TAKE 1 CAPSULE BY MOUTH ONCE DAILY 90 capsule 3  . predniSONE (DELTASONE) 20 MG tablet Take 40 mg by mouth once daily  . valsartan-hydrochlorothiazide (DIOVAN-HCT) 80-12.5 mg tablet TAKE 1 TABLET BY MOUTH ONCE DAILY 90 tablet 3   No current Epic-ordered facility-administered medications on file.    Allergies: Allergies  Allergen Reactions  . Simvastatin Muscle Pain    Review of Systems:  A comprehensive 14 point ROS was performed, reviewed by me today, and the pertinent orthopaedic findings are documented in the HPI.  Exam: There were no vitals taken for this visit.  General: Well developed, well nourished 69 y.o. female in no apparent distress. Normal affect. Normal communication. Patient answers questions appropriately. Antalgic gait with a cane.  HEENT: Head normocephalic, atraumatic, PERRL.   Abdomen: Soft, non tender, non distended, Bowel sounds present.  Heart: Examination of the heart reveals regular, rate, and rhythm. There is no murmur noted on ascultation. There is a normal apical pulse.  Lungs: Lungs are clear to auscultation. There is no wheeze, rhonchi, or crackles. There is normal expansion of bilateral chest walls.   Lumbar Spine: Examination of the lumbar spine reveals no bony abnormality, no edema, and no ecchymosis. There is no step off. The patient has full range of motion of the lumbar spine with flexion and extension. The patient has normal lateral bend and rotation. The patient has no pain with range of motion activities. The patient is non tender along the spinous process. The patient is non tender along the paravertebral muscles, with no muscle spasms. The patient is non tender along the iliac crest. The patient is non tender in the sciatic notch. The patient is non tender along the Sacroiliac joint.  Right lower Extremities: Examination of the right lower extremities reveals no bony abnormality, no edema, and no ecchymosis. The patient has mild to moderate stiffness with right hip internal rotation. 30 degrees of hip internal rotation with severe discomfort reproduced in the thigh, lateral hip and buttocks going down to the knee. 10 degrees of hip external rotation. The patient has no anterior hip joint tenderness. The patient has a negative axial  load test at the hip joint. Pain mostly with hip flexion and internal rotation. The patient is mildly tender along the right greater trochanter region. The patient has a negative Bevelyn Buckles' test bilaterally. There is normal skin warmth. There is normal capillary refill bilaterally.   Neurologic: The patient has a negative straight leg raise. The patient has normal muscle strength testing for the quadriceps, calves, ankle dorsiflexion, ankle plantarflexion, and extensor hallicus longus. The patient has sensation that is intact to light touch. The deep tendon reflexes are normal at the patella and achilles. No clonus is noted.   Interface, Rad Results In - 02/05/2020 1:14 PM EST CLINICAL DATA: RIGHT hip pain for 1 month worsened in past 2 days, hip pain radiating down RIGHT thigh, difficulty walking  EXAM: DG HIP (WITH OR WITHOUT PELVIS) 2-3V RIGHT  COMPARISON: None  FINDINGS: Osseous demineralization.  Hip and SI joint spaces preserved.  Degenerative changes RIGHT hip joint with cortical loss and subchondral cyst at acetabular roof.  No acute fracture, dislocation or additional bone destruction.  Mild degenerative disc and facet disease changes at visualized lumbar spine.  IMPRESSION: Degenerative changes  of the RIGHT hip joint and visualized lower lumbar spine.  Osseous demineralization.  Electronically Signed By: Lavonia Dana M.D. On: 02/05/2020 13:  AP pelvis and lateral view of the right hip ordered interpreted by me in the office today. Impression: Patient has severe right hip osteoarthritis. There is subchondral cyst formation along the superior acetabulum with complete loss of joint space in the central hip joint and significant superior joint space loss. No evidence of acute bony normality. Pelvic rings are intact. Very little left hip arthritis present. AP lateral and sunrise views the right knee are ordered interpreted by me in the office today. Impression: Patient has  adequate joint space in the medial lateral compartment with very mild subchondral changes in the medial tibial plateau. Patella tracks well in the trochlear groove.  Impression: Primary osteoarthritis of right hip [M16.11] Primary osteoarthritis of right hip (primary encounter diagnosis)  Plan:  22. 69 year old female with severe right hip pain. X-ray shows severe right hip osteoarthritis. Pain has become more severe, located in her groin, buttocks and thigh all the way down to the knee. No numbness or tingling. Pain is reproduced with hip internal rotation. History, exam, imaging consistent with severe right hip osteoarthritis. Risks, benefits, complications of a right total hip arthroplasty have been discussed with the patient. Patient has agreed and consented the procedure with Dr. Hessie Knows on 07/09/2020. Patient knows to hold her Eliquis starting July 10.  This note was generated in part with voice recognition software and I apologize for any typographical errors that were not detected and corrected.  Feliberto Gottron MPA-C   Reviewed paper H+P, will be scanned into chart. No changes noted.

## 2020-07-09 ENCOUNTER — Inpatient Hospital Stay: Payer: Medicare Other | Admitting: Urgent Care

## 2020-07-09 ENCOUNTER — Observation Stay: Payer: Medicare Other

## 2020-07-09 ENCOUNTER — Encounter
Admission: RE | Disposition: A | Discharge status: Skilled Nursing Facility | Payer: Self-pay | Source: Home / Self Care | Attending: Orthopedic Surgery

## 2020-07-09 ENCOUNTER — Inpatient Hospital Stay: Payer: Medicare Other

## 2020-07-09 ENCOUNTER — Inpatient Hospital Stay
Admission: RE | Admit: 2020-07-09 | Discharge: 2020-07-13 | DRG: 470 | Disposition: A | Discharge status: Skilled Nursing Facility | Payer: Medicare Other | Attending: Orthopedic Surgery | Admitting: Orthopedic Surgery

## 2020-07-09 ENCOUNTER — Encounter: Payer: Self-pay | Admitting: Orthopedic Surgery

## 2020-07-09 ENCOUNTER — Other Ambulatory Visit: Payer: Self-pay

## 2020-07-09 DIAGNOSIS — Z87891 Personal history of nicotine dependence: Secondary | ICD-10-CM | POA: Diagnosis not present

## 2020-07-09 DIAGNOSIS — Z8616 Personal history of COVID-19: Secondary | ICD-10-CM | POA: Diagnosis not present

## 2020-07-09 DIAGNOSIS — M545 Low back pain: Secondary | ICD-10-CM | POA: Diagnosis present

## 2020-07-09 DIAGNOSIS — M1611 Unilateral primary osteoarthritis, right hip: Secondary | ICD-10-CM | POA: Diagnosis present

## 2020-07-09 DIAGNOSIS — Z79899 Other long term (current) drug therapy: Secondary | ICD-10-CM | POA: Diagnosis not present

## 2020-07-09 DIAGNOSIS — I10 Essential (primary) hypertension: Secondary | ICD-10-CM | POA: Diagnosis present

## 2020-07-09 DIAGNOSIS — Z96649 Presence of unspecified artificial hip joint: Secondary | ICD-10-CM

## 2020-07-09 DIAGNOSIS — Z7901 Long term (current) use of anticoagulants: Secondary | ICD-10-CM | POA: Diagnosis not present

## 2020-07-09 DIAGNOSIS — G8918 Other acute postprocedural pain: Secondary | ICD-10-CM

## 2020-07-09 DIAGNOSIS — Z419 Encounter for procedure for purposes other than remedying health state, unspecified: Secondary | ICD-10-CM

## 2020-07-09 DIAGNOSIS — G43909 Migraine, unspecified, not intractable, without status migrainosus: Secondary | ICD-10-CM | POA: Diagnosis present

## 2020-07-09 DIAGNOSIS — Z825 Family history of asthma and other chronic lower respiratory diseases: Secondary | ICD-10-CM | POA: Diagnosis not present

## 2020-07-09 DIAGNOSIS — H9190 Unspecified hearing loss, unspecified ear: Secondary | ICD-10-CM | POA: Diagnosis present

## 2020-07-09 DIAGNOSIS — F419 Anxiety disorder, unspecified: Secondary | ICD-10-CM | POA: Diagnosis present

## 2020-07-09 DIAGNOSIS — G8929 Other chronic pain: Secondary | ICD-10-CM | POA: Diagnosis present

## 2020-07-09 DIAGNOSIS — J449 Chronic obstructive pulmonary disease, unspecified: Secondary | ICD-10-CM | POA: Diagnosis present

## 2020-07-09 DIAGNOSIS — F329 Major depressive disorder, single episode, unspecified: Secondary | ICD-10-CM | POA: Diagnosis present

## 2020-07-09 DIAGNOSIS — E785 Hyperlipidemia, unspecified: Secondary | ICD-10-CM | POA: Diagnosis present

## 2020-07-09 DIAGNOSIS — Z7952 Long term (current) use of systemic steroids: Secondary | ICD-10-CM

## 2020-07-09 DIAGNOSIS — K219 Gastro-esophageal reflux disease without esophagitis: Secondary | ICD-10-CM | POA: Diagnosis present

## 2020-07-09 DIAGNOSIS — Z820 Family history of epilepsy and other diseases of the nervous system: Secondary | ICD-10-CM | POA: Diagnosis not present

## 2020-07-09 HISTORY — PX: TOTAL HIP ARTHROPLASTY: SHX124

## 2020-07-09 LAB — BASIC METABOLIC PANEL
Anion gap: 8 (ref 5–15)
BUN: 9 mg/dL (ref 8–23)
CO2: 27 mmol/L (ref 22–32)
Calcium: 8.5 mg/dL — ABNORMAL LOW (ref 8.9–10.3)
Chloride: 107 mmol/L (ref 98–111)
Creatinine, Ser: 0.72 mg/dL (ref 0.44–1.00)
GFR calc Af Amer: >60 mL/min (ref >60)
GFR calc non Af Amer: >60 mL/min (ref >60)
Glucose, Bld: 139 mg/dL — ABNORMAL HIGH (ref 70–99)
Potassium: 3.4 mmol/L — ABNORMAL LOW (ref 3.5–5.1)
Sodium: 142 mmol/L (ref 135–145)

## 2020-07-09 LAB — ABO/RH: ABO/RH(D): O POS

## 2020-07-09 SURGERY — ARTHROPLASTY, HIP, TOTAL, ANTERIOR APPROACH
Anesthesia: Spinal | Site: Hip | Laterality: Right

## 2020-07-09 MED ORDER — BUPIVACAINE LIPOSOME 1.3 % IJ SUSP
INTRAMUSCULAR | Status: AC
  Filled 2020-07-09: qty 20

## 2020-07-09 MED ORDER — CEFAZOLIN SODIUM-DEXTROSE 2-4 GM/100ML-% IV SOLN
2.0000 g | Freq: Four times a day (QID) | INTRAVENOUS | Status: AC
  Administered 2020-07-09 (×2): 2 g via INTRAVENOUS
  Filled 2020-07-09 (×2): qty 100

## 2020-07-09 MED ORDER — DIPHENHYDRAMINE HCL 12.5 MG/5ML PO ELIX: 12.5000 mg | ORAL_SOLUTION | ORAL | Status: DC | PRN

## 2020-07-09 MED ORDER — HYDROCHLOROTHIAZIDE 12.5 MG PO CAPS
12.5000 mg | ORAL_CAPSULE | Freq: Every day | ORAL | Status: DC
  Administered 2020-07-10: 12.5 mg via ORAL
  Filled 2020-07-09 (×5): qty 1

## 2020-07-09 MED ORDER — METOCLOPRAMIDE HCL 10 MG PO TABS: 5.0000 mg | ORAL_TABLET | Freq: Three times a day (TID) | ORAL | Status: DC | PRN

## 2020-07-09 MED ORDER — PHENOL 1.4 % MT LIQD
1.0000 | OROMUCOSAL | Status: DC | PRN
  Filled 2020-07-09: qty 177

## 2020-07-09 MED ORDER — BUPIVACAINE HCL (PF) 0.5 % IJ SOLN
INTRAMUSCULAR | Status: AC
  Filled 2020-07-09: qty 20

## 2020-07-09 MED ORDER — DILTIAZEM HCL ER COATED BEADS 180 MG PO CP24
180.0000 mg | ORAL_CAPSULE | Freq: Every day | ORAL | Status: DC
  Administered 2020-07-10 – 2020-07-11 (×2): 180 mg via ORAL
  Filled 2020-07-09 (×4): qty 1

## 2020-07-09 MED ORDER — ALBUTEROL SULFATE (2.5 MG/3ML) 0.083% IN NEBU: 2.5000 mg | INHALATION_SOLUTION | Freq: Four times a day (QID) | RESPIRATORY_TRACT | Status: DC | PRN

## 2020-07-09 MED ORDER — BUPIVACAINE-EPINEPHRINE 0.25% -1:200000 IJ SOLN
INTRAMUSCULAR | Status: DC | PRN
  Administered 2020-07-09: 30 mL

## 2020-07-09 MED ORDER — ONDANSETRON HCL 4 MG/2ML IJ SOLN: 4.0000 mg | Freq: Once | INTRAMUSCULAR | Status: DC | PRN

## 2020-07-09 MED ORDER — DOCUSATE SODIUM 100 MG PO CAPS
100.0000 mg | ORAL_CAPSULE | Freq: Two times a day (BID) | ORAL | Status: DC
  Administered 2020-07-09 – 2020-07-13 (×8): 100 mg via ORAL
  Filled 2020-07-09 (×8): qty 1

## 2020-07-09 MED ORDER — SODIUM CHLORIDE FLUSH 0.9 % IV SOLN
INTRAVENOUS | Status: AC
  Filled 2020-07-09: qty 40

## 2020-07-09 MED ORDER — KETAMINE HCL 10 MG/ML IJ SOLN
INTRAMUSCULAR | Status: DC | PRN
  Administered 2020-07-09: 20 mg via INTRAVENOUS

## 2020-07-09 MED ORDER — HYDROCODONE-ACETAMINOPHEN 5-325 MG PO TABS
1.0000 | ORAL_TABLET | ORAL | Status: DC | PRN
  Administered 2020-07-10: 2 via ORAL
  Filled 2020-07-09: qty 2

## 2020-07-09 MED ORDER — HYDROCODONE-ACETAMINOPHEN 7.5-325 MG PO TABS
1.0000 | ORAL_TABLET | ORAL | Status: DC | PRN
  Administered 2020-07-09: 2 via ORAL
  Administered 2020-07-12: 1 via ORAL
  Filled 2020-07-09 (×2): qty 1
  Filled 2020-07-09 (×2): qty 2

## 2020-07-09 MED ORDER — IPRATROPIUM-ALBUTEROL 0.5-2.5 (3) MG/3ML IN SOLN
3.0000 mL | Freq: Four times a day (QID) | RESPIRATORY_TRACT | Status: DC
  Administered 2020-07-09 – 2020-07-10 (×3): 3 mL via RESPIRATORY_TRACT
  Filled 2020-07-09 (×3): qty 3

## 2020-07-09 MED ORDER — PROPOFOL 10 MG/ML IV BOLUS
INTRAVENOUS | Status: DC | PRN
  Administered 2020-07-09: 20 mg via INTRAVENOUS

## 2020-07-09 MED ORDER — ORAL CARE MOUTH RINSE: 15.0000 mL | Freq: Once | OROMUCOSAL | Status: AC

## 2020-07-09 MED ORDER — PANTOPRAZOLE SODIUM 40 MG PO TBEC
80.0000 mg | DELAYED_RELEASE_TABLET | Freq: Every day | ORAL | Status: DC
  Administered 2020-07-10 – 2020-07-13 (×4): 80 mg via ORAL
  Filled 2020-07-09 (×4): qty 2

## 2020-07-09 MED ORDER — FLUTICASONE PROPIONATE 50 MCG/ACT NA SUSP
1.0000 | Freq: Every day | NASAL | Status: DC | PRN
  Filled 2020-07-09: qty 16

## 2020-07-09 MED ORDER — ONDANSETRON HCL 4 MG/2ML IJ SOLN: 4.0000 mg | Freq: Four times a day (QID) | INTRAMUSCULAR | Status: DC | PRN

## 2020-07-09 MED ORDER — MORPHINE SULFATE (PF) 2 MG/ML IV SOLN
0.5000 mg | INTRAVENOUS | Status: DC | PRN
  Administered 2020-07-09: 1 mg via INTRAVENOUS
  Filled 2020-07-09: qty 1

## 2020-07-09 MED ORDER — CHLORHEXIDINE GLUCONATE 0.12 % MT SOLN: 15.0000 mL | Freq: Once | OROMUCOSAL | Status: AC

## 2020-07-09 MED ORDER — DULOXETINE HCL 60 MG PO CPEP
60.0000 mg | ORAL_CAPSULE | Freq: Every day | ORAL | Status: DC
  Administered 2020-07-10 – 2020-07-13 (×4): 60 mg via ORAL
  Filled 2020-07-09 (×5): qty 1

## 2020-07-09 MED ORDER — SODIUM CHLORIDE (PF) 0.9 % IJ SOLN
INTRAMUSCULAR | Status: AC
  Filled 2020-07-09: qty 10

## 2020-07-09 MED ORDER — APIXABAN 5 MG PO TABS
5.0000 mg | ORAL_TABLET | Freq: Two times a day (BID) | ORAL | Status: DC
  Administered 2020-07-10 – 2020-07-13 (×7): 5 mg via ORAL
  Filled 2020-07-09 (×7): qty 1

## 2020-07-09 MED ORDER — METHOCARBAMOL 1000 MG/10ML IJ SOLN
500.0000 mg | Freq: Four times a day (QID) | INTRAVENOUS | Status: DC | PRN
  Filled 2020-07-09: qty 5

## 2020-07-09 MED ORDER — METOCLOPRAMIDE HCL 5 MG/ML IJ SOLN: 5.0000 mg | Freq: Three times a day (TID) | INTRAMUSCULAR | Status: DC | PRN

## 2020-07-09 MED ORDER — EPHEDRINE SULFATE 50 MG/ML IJ SOLN
INTRAMUSCULAR | Status: DC | PRN
  Administered 2020-07-09: 5 mg via INTRAVENOUS
  Administered 2020-07-09 (×3): 10 mg via INTRAVENOUS
  Administered 2020-07-09: 5 mg via INTRAVENOUS

## 2020-07-09 MED ORDER — MENTHOL 3 MG MT LOZG
1.0000 | LOZENGE | OROMUCOSAL | Status: DC | PRN
  Filled 2020-07-09: qty 9

## 2020-07-09 MED ORDER — CHLORHEXIDINE GLUCONATE 0.12 % MT SOLN
OROMUCOSAL | Status: AC
  Administered 2020-07-09: 15 mL via OROMUCOSAL
  Filled 2020-07-09: qty 15

## 2020-07-09 MED ORDER — METHOCARBAMOL 500 MG PO TABS
500.0000 mg | ORAL_TABLET | Freq: Four times a day (QID) | ORAL | Status: DC | PRN
  Administered 2020-07-11: 500 mg via ORAL
  Filled 2020-07-09: qty 1

## 2020-07-09 MED ORDER — MAGNESIUM CITRATE PO SOLN
1.0000 | Freq: Once | ORAL | Status: AC | PRN
  Administered 2020-07-11: 1 via ORAL
  Filled 2020-07-09 (×2): qty 296

## 2020-07-09 MED ORDER — NEOMYCIN-POLYMYXIN B GU 40-200000 IR SOLN
Status: AC
  Filled 2020-07-09: qty 20

## 2020-07-09 MED ORDER — PROPOFOL 500 MG/50ML IV EMUL
INTRAVENOUS | Status: DC | PRN
  Administered 2020-07-09: 100 ug/kg/min via INTRAVENOUS

## 2020-07-09 MED ORDER — OXYCODONE HCL 5 MG/5ML PO SOLN: 5.0000 mg | Freq: Once | ORAL | Status: DC | PRN

## 2020-07-09 MED ORDER — DEXMEDETOMIDINE HCL 200 MCG/2ML IV SOLN
INTRAVENOUS | Status: DC | PRN
  Administered 2020-07-09: 12 ug via INTRAVENOUS

## 2020-07-09 MED ORDER — DILTIAZEM HCL 30 MG PO TABS: 30.0000 mg | ORAL_TABLET | Freq: Every day | ORAL | Status: DC | PRN

## 2020-07-09 MED ORDER — SODIUM CHLORIDE 0.9 % IV SOLN
INTRAVENOUS | Status: DC | PRN
  Administered 2020-07-09: 60 mL

## 2020-07-09 MED ORDER — ACETAMINOPHEN 325 MG PO TABS: 325.0000 mg | ORAL_TABLET | Freq: Four times a day (QID) | ORAL | Status: DC | PRN

## 2020-07-09 MED ORDER — MAGNESIUM HYDROXIDE 400 MG/5ML PO SUSP
30.0000 mL | Freq: Every day | ORAL | Status: DC | PRN
  Administered 2020-07-10: 30 mL via ORAL
  Filled 2020-07-09: qty 30

## 2020-07-09 MED ORDER — BISACODYL 10 MG RE SUPP: 10.0000 mg | Freq: Every day | RECTAL | Status: DC | PRN

## 2020-07-09 MED ORDER — ACETAMINOPHEN 10 MG/ML IV SOLN
INTRAVENOUS | Status: AC
  Filled 2020-07-09: qty 200

## 2020-07-09 MED ORDER — FENTANYL CITRATE (PF) 100 MCG/2ML IJ SOLN
INTRAMUSCULAR | Status: AC
  Administered 2020-07-09: 25 ug via INTRAVENOUS
  Filled 2020-07-09: qty 2

## 2020-07-09 MED ORDER — ALUM & MAG HYDROXIDE-SIMETH 200-200-20 MG/5ML PO SUSP: 30.0000 mL | ORAL | Status: DC | PRN

## 2020-07-09 MED ORDER — VALSARTAN-HYDROCHLOROTHIAZIDE 80-12.5 MG PO TABS: 1.0000 | ORAL_TABLET | Freq: Every day | ORAL | Status: DC

## 2020-07-09 MED ORDER — CEFAZOLIN SODIUM-DEXTROSE 2-4 GM/100ML-% IV SOLN
2.0000 g | INTRAVENOUS | Status: AC
  Administered 2020-07-09: 2 g via INTRAVENOUS

## 2020-07-09 MED ORDER — METOPROLOL TARTRATE 25 MG PO TABS
25.0000 mg | ORAL_TABLET | Freq: Two times a day (BID) | ORAL | Status: DC
  Administered 2020-07-09 – 2020-07-13 (×7): 25 mg via ORAL
  Filled 2020-07-09 (×8): qty 1

## 2020-07-09 MED ORDER — LACTATED RINGERS IV SOLN: INTRAVENOUS | Status: DC

## 2020-07-09 MED ORDER — OXYCODONE HCL 5 MG PO TABS: 5.0000 mg | ORAL_TABLET | Freq: Once | ORAL | Status: DC | PRN

## 2020-07-09 MED ORDER — SODIUM CHLORIDE 0.9 % IV SOLN
INTRAVENOUS | Status: DC | PRN
  Administered 2020-07-09: 20 ug/min via INTRAVENOUS

## 2020-07-09 MED ORDER — BUPIVACAINE HCL (PF) 0.5 % IJ SOLN
INTRAMUSCULAR | Status: DC | PRN
  Administered 2020-07-09: 2.8 mL

## 2020-07-09 MED ORDER — FENTANYL CITRATE (PF) 100 MCG/2ML IJ SOLN
25.0000 ug | INTRAMUSCULAR | Status: DC | PRN
  Administered 2020-07-09 (×3): 25 ug via INTRAVENOUS

## 2020-07-09 MED ORDER — IRBESARTAN 150 MG PO TABS
75.0000 mg | ORAL_TABLET | Freq: Every day | ORAL | Status: DC
  Administered 2020-07-10: 75 mg via ORAL
  Filled 2020-07-09 (×4): qty 1

## 2020-07-09 MED ORDER — ONDANSETRON HCL 4 MG PO TABS: 4.0000 mg | ORAL_TABLET | Freq: Four times a day (QID) | ORAL | Status: DC | PRN

## 2020-07-09 MED ORDER — ACETAMINOPHEN 10 MG/ML IV SOLN
INTRAVENOUS | Status: DC | PRN
  Administered 2020-07-09: 1000 mg via INTRAVENOUS

## 2020-07-09 MED ORDER — EPHEDRINE 5 MG/ML INJ
INTRAVENOUS | Status: AC
  Filled 2020-07-09: qty 10

## 2020-07-09 MED ORDER — TRAMADOL HCL 50 MG PO TABS
50.0000 mg | ORAL_TABLET | Freq: Four times a day (QID) | ORAL | Status: DC
  Administered 2020-07-09 – 2020-07-13 (×16): 50 mg via ORAL
  Filled 2020-07-09 (×16): qty 1

## 2020-07-09 MED ORDER — PROPOFOL 10 MG/ML IV BOLUS
INTRAVENOUS | Status: AC
  Filled 2020-07-09: qty 120

## 2020-07-09 MED ORDER — NEOMYCIN-POLYMYXIN B GU 40-200000 IR SOLN
Status: DC | PRN
  Administered 2020-07-09: 4 mL

## 2020-07-09 MED ORDER — SODIUM CHLORIDE 0.9 % IV SOLN: INTRAVENOUS | Status: DC

## 2020-07-09 MED ORDER — CEFAZOLIN SODIUM-DEXTROSE 2-4 GM/100ML-% IV SOLN
INTRAVENOUS | Status: AC
  Filled 2020-07-09: qty 100

## 2020-07-09 MED ORDER — ZOLPIDEM TARTRATE 5 MG PO TABS: 5.0000 mg | ORAL_TABLET | Freq: Every evening | ORAL | Status: DC | PRN

## 2020-07-09 SURGICAL SUPPLY — 59 items
BLADE SAGITTAL AGGR TOOTH XLG (BLADE) ×3 IMPLANT
BNDG COHESIVE 6X5 TAN STRL LF (GAUZE/BANDAGES/DRESSINGS) ×9 IMPLANT
CANISTER SUCT 1200ML W/VALVE (MISCELLANEOUS) ×3 IMPLANT
CANISTER WOUND CARE 500ML ATS (WOUND CARE) ×3 IMPLANT
CHLORAPREP W/TINT 26 (MISCELLANEOUS) ×3 IMPLANT
COVER BACK TABLE REUSABLE LG (DRAPES) ×3 IMPLANT
COVER WAND RF STERILE (DRAPES) ×3 IMPLANT
DRAPE 3/4 80X56 (DRAPES) ×9 IMPLANT
DRAPE C-ARM XRAY 36X54 (DRAPES) ×3 IMPLANT
DRAPE INCISE IOBAN 66X60 STRL (DRAPES) IMPLANT
DRAPE POUCH INSTRU U-SHP 10X18 (DRAPES) ×3 IMPLANT
DRESSING SURGICEL FIBRLLR 1X2 (HEMOSTASIS) ×2 IMPLANT
DRSG OPSITE POSTOP 4X8 (GAUZE/BANDAGES/DRESSINGS) ×6 IMPLANT
DRSG SURGICEL FIBRILLAR 1X2 (HEMOSTASIS) ×6
ELECT BLADE 6.5 EXT (BLADE) ×3 IMPLANT
ELECT REM PT RETURN 9FT ADLT (ELECTROSURGICAL) ×3
ELECTRODE REM PT RTRN 9FT ADLT (ELECTROSURGICAL) ×1 IMPLANT
GLOVE BIOGEL PI IND STRL 9 (GLOVE) ×1 IMPLANT
GLOVE BIOGEL PI INDICATOR 9 (GLOVE) ×2
GLOVE SURG SYN 9.0  PF PI (GLOVE) ×4
GLOVE SURG SYN 9.0 PF PI (GLOVE) ×2 IMPLANT
GOWN SRG 2XL LVL 4 RGLN SLV (GOWNS) ×1 IMPLANT
GOWN STRL NON-REIN 2XL LVL4 (GOWNS) ×2
GOWN STRL REUS W/ TWL LRG LVL3 (GOWN DISPOSABLE) ×1 IMPLANT
GOWN STRL REUS W/TWL LRG LVL3 (GOWN DISPOSABLE) ×2
HEMOVAC 400CC 10FR (MISCELLANEOUS) IMPLANT
HIP DBL LINER 54X28 (Liner) ×3 IMPLANT
HIP FEM HD S 28 (Head) ×3 IMPLANT
HOLDER FOLEY CATH W/STRAP (MISCELLANEOUS) ×3 IMPLANT
HOOD PEEL AWAY FLYTE STAYCOOL (MISCELLANEOUS) ×3 IMPLANT
KIT PREVENA INCISION MGT 13 (CANNISTER) ×3 IMPLANT
MAT ABSORB  FLUID 56X50 GRAY (MISCELLANEOUS) ×2
MAT ABSORB FLUID 56X50 GRAY (MISCELLANEOUS) ×1 IMPLANT
NDL SAFETY ECLIPSE 18X1.5 (NEEDLE) ×1 IMPLANT
NEEDLE HYPO 18GX1.5 SHARP (NEEDLE) ×2
NEEDLE SPNL 20GX3.5 QUINCKE YW (NEEDLE) ×6 IMPLANT
NS IRRIG 1000ML POUR BTL (IV SOLUTION) ×3 IMPLANT
PACK HIP COMPR (MISCELLANEOUS) ×3 IMPLANT
SCALPEL PROTECTED #10 DISP (BLADE) ×6 IMPLANT
SHELL ACETABULAR SZ 54 DM (Shell) ×3 IMPLANT
SOL PREP PVP 2OZ (MISCELLANEOUS) ×3
SOLUTION PREP PVP 2OZ (MISCELLANEOUS) ×1 IMPLANT
SPONGE DRAIN TRACH 4X4 STRL 2S (GAUZE/BANDAGES/DRESSINGS) ×3 IMPLANT
STAPLER SKIN PROX 35W (STAPLE) ×3 IMPLANT
STEM FEMORAL SZ3  STD COLLARED (Stem) ×3 IMPLANT
STRAP SAFETY 5IN WIDE (MISCELLANEOUS) ×3 IMPLANT
SUT DVC 2 QUILL PDO  T11 36X36 (SUTURE) ×2
SUT DVC 2 QUILL PDO T11 36X36 (SUTURE) ×1 IMPLANT
SUT SILK 0 (SUTURE) ×2
SUT SILK 0 30XBRD TIE 6 (SUTURE) ×1 IMPLANT
SUT V-LOC 90 ABS DVC 3-0 CL (SUTURE) ×3 IMPLANT
SUT VIC AB 1 CT1 36 (SUTURE) ×3 IMPLANT
SYR 20ML LL LF (SYRINGE) ×3 IMPLANT
SYR 30ML LL (SYRINGE) ×3 IMPLANT
SYR 50ML LL SCALE MARK (SYRINGE) ×6 IMPLANT
SYR BULB IRRIG 60ML STRL (SYRINGE) ×3 IMPLANT
TAPE MICROFOAM 4IN (TAPE) ×3 IMPLANT
TOWEL OR 17X26 4PK STRL BLUE (TOWEL DISPOSABLE) ×3 IMPLANT
TRAY FOLEY MTR SLVR 16FR STAT (SET/KITS/TRAYS/PACK) ×3 IMPLANT

## 2020-07-09 NOTE — Progress Notes (Signed)
PT Cancellation Note  Patient Details Name: ASMARA BACKS MRN: [id number] DOB: [birth year]   Cancelled Treatment:    Reason Eval/Treat Not Completed: Patient not medically ready   Discussed with nurse. Patient is unable to actively move surgical leg adequately for safe OOB mobility at this time. PT will follow up as appropriate when anesthesia resolved.    Percell Locus 07/09/2020, 3:24 PM

## 2020-07-09 NOTE — Transfer of Care (Signed)
Immediate Anesthesia Transfer of Care Note  Patient: Sabrina Hudson  Procedure(s) Performed: RIGHT TOTAL HIP ARTHROPLASTY ANTERIOR APPROACH (Right Hip)  Patient Location: PACU  Anesthesia Type:General  Level of Consciousness: drowsy  Airway & Oxygen Therapy: Patient Spontanous Breathing and Patient connected to face mask oxygen  Post-op Assessment: Report given to RN and Post -op Vital signs reviewed and stable  Post vital signs: Reviewed and stable  Last Vitals:  Vitals Value Taken Time  BP 109/67 07/09/20 1256  Temp    Pulse 63 07/09/20 1300  Resp 10 07/09/20 1300  SpO2 100 % 07/09/20 1300  Vitals shown include unvalidated device data.  Last Pain:  Vitals:   07/09/20 1256  TempSrc:   PainSc: (P) Asleep         Complications: No complications documented.

## 2020-07-09 NOTE — Anesthesia Preprocedure Evaluation (Signed)
Anesthesia Evaluation  Patient identified by MRN, date of birth, ID band Patient awake    Reviewed: Allergy & Precautions, H&P , NPO status , Patient's Chart, lab work & pertinent test results  Airway Mallampati: II  TM Distance: >3 FB Neck ROM: full    Dental  (+) Missing   Pulmonary COPD, former smoker,     + decreased breath sounds      Cardiovascular hypertension, (-) Past MI + dysrhythmias Atrial Fibrillation  Rhythm:irregular Rate:Normal     Neuro/Psych Anxiety negative neurological ROS     GI/Hepatic Neg liver ROS, GERD  Controlled,  Endo/Other  negative endocrine ROS  Renal/GU      Musculoskeletal   Abdominal   Peds  Hematology Last dose Eliquis >72 hrs ago   Anesthesia Other Findings Past Medical History: No date: Anxiety No date: Arthritis No date: COPD (chronic obstructive pulmonary disease) (HCC) No date: GERD (gastroesophageal reflux disease) 01/23/2020: History of 2019 novel coronavirus disease (COVID-19) No date: Hypertension  Past Surgical History: No date: ABDOMINAL HYSTERECTOMY No date: CESAREAN SECTION     Comment:  x 2 2011: COLONOSCOPY 2011: ESOPHAGOGASTRODUODENOSCOPY ENDOSCOPY No date: TONSILLECTOMY     Reproductive/Obstetrics negative OB ROS                             Anesthesia Physical Anesthesia Plan  ASA: III  Anesthesia Plan: Spinal   Post-op Pain Management:    Induction:   PONV Risk Score and Plan:   Airway Management Planned: Natural Airway and Simple Face Mask  Additional Equipment:   Intra-op Plan:   Post-operative Plan:   Informed Consent: I have reviewed the patients History and Physical, chart, labs and discussed the procedure including the risks, benefits and alternatives for the proposed anesthesia with the patient or authorized representative who has indicated his/her understanding and acceptance.     Dental Advisory  Given  Plan Discussed with: Anesthesiologist, CRNA and Surgeon  Anesthesia Plan Comments:         Anesthesia Quick Evaluation

## 2020-07-09 NOTE — OR Nursing (Signed)
Instructed on use of incentive spirometer with return demonstration. 

## 2020-07-09 NOTE — Op Note (Signed)
07/09/2020  12:57 PM  PATIENT:  Sabrina Hudson  69 y.o. female  PRE-OPERATIVE DIAGNOSIS:  Primary osteoarthritis of right hip  POST-OPERATIVE DIAGNOSIS:  Primary osteoarthritis of right hip  PROCEDURE:  Procedure(s): RIGHT TOTAL HIP ARTHROPLASTY ANTERIOR APPROACH (Right)  SURGEON: Laurene Footman, MD  ASSISTANTS: None  ANESTHESIA:   spinal  EBL:  Total I/O In: 1100 [I.V.:1000; IV Piggyback:100] Out: 850 [Urine:350; Blood:500]  BLOOD ADMINISTERED:none  DRAINS: Incisional wound VAC   LOCAL MEDICATIONS USED:  MARCAINE    and OTHER Exparel  SPECIMEN:  Source of Specimen:  Right femoral head  DISPOSITION OF SPECIMEN:  PATHOLOGY  COUNTS:  YES  TOURNIQUET:  * No tourniquets in log *  IMPLANTS: Medacta AMIS 3 standard stem,54 millimeter Mpact DM cup and liner with metal S 28 mm head  DICTATION: .Dragon Dictation    The patient was brought to the operating room and after spinal anesthesia was obtained patient was placed on the operative table with the ipsilateral foot into the Medacta attachment, contralateral leg on a well-padded table. C-arm was brought in and preop template x-ray taken. After prepping and draping in usual sterile fashion appropriate patient identification and timeout procedures were completed. Anterior approach to the hip was obtained and centered over the greater trochanter and TFL muscle. The subcutaneous tissue was incised hemostasis being achieved by electrocautery. TFL fascia was incised and the muscle retracted laterally deep retractor placed. The lateral femoral circumflex vessels were identified and ligated. The anterior capsule was exposed and a capsulotomy performed. The neck was identified and a femoral neck cut carried out with a saw. The head was removed without difficulty and showed sclerotic femoral head and acetabulum. Reaming was carried out to 52 mm and a 54 mm cup trial gave appropriate tightness to the acetabular component a 54 DM cup was  impacted into position. The leg was then externally rotated and ischiofemoral and pubofemoral releases carried out. The femur was sequentially broached to a size 3, size 3 standard with S head trials were placed and the final components chosen. The 3 standard stem was inserted along with a metal S 28 mm head and 54 mm liner. The hip was reduced and was stable the wound was thoroughly irrigated with fibrillar placed along the posterior capsule and medial neck. The deep fascia ws closed using a heavy Quill after infiltration of 30 cc of quarter percent Sensorcaine with epinephrine with Exparel, .3-0 V-loc to close the skin with skin staples.  Incisional wound VAC applied and e patient was sent to recovery in stable condition.   PLAN OF CARE: Admit for overnight observation

## 2020-07-09 NOTE — Anesthesia Procedure Notes (Signed)
Spinal  Patient location during procedure: OR Start time: 07/09/2020 11:32 AM End time: 07/09/2020 11:40 AM Staffing Performed: resident/CRNA  Anesthesiologist: Tera Mater, MD Resident/CRNA: Lia Foyer, CRNA Preanesthetic Checklist Completed: patient identified, IV checked, site marked, risks and benefits discussed, surgical consent, monitors and equipment checked, pre-op evaluation and timeout performed Spinal Block Patient position: sitting Prep: DuraPrep Patient monitoring: heart rate, cardiac monitor, continuous pulse ox and blood pressure Approach: midline Location: L3-4 Injection technique: single-shot Needle Needle type: Pencan  Needle gauge: 25 G Needle length: 9 cm Assessment Sensory level: T4

## 2020-07-10 ENCOUNTER — Encounter: Payer: Self-pay | Admitting: Orthopedic Surgery

## 2020-07-10 LAB — CBC
HCT: 33.3 % — ABNORMAL LOW (ref 36.0–46.0)
Hemoglobin: 11.6 g/dL — ABNORMAL LOW (ref 12.0–15.0)
MCH: 31.7 pg (ref 26.0–34.0)
MCHC: 34.8 g/dL (ref 30.0–36.0)
MCV: 91 fL (ref 80.0–100.0)
Platelets: 206 10*3/uL (ref 150–400)
RBC: 3.66 MIL/uL — ABNORMAL LOW (ref 3.87–5.11)
RDW: 13.2 % (ref 11.5–15.5)
WBC: 10.5 10*3/uL (ref 4.0–10.5)
nRBC: 0 % (ref 0.0–0.2)

## 2020-07-10 MED ORDER — IPRATROPIUM-ALBUTEROL 0.5-2.5 (3) MG/3ML IN SOLN
3.0000 mL | Freq: Three times a day (TID) | RESPIRATORY_TRACT | Status: DC
  Administered 2020-07-10 – 2020-07-13 (×8): 3 mL via RESPIRATORY_TRACT
  Filled 2020-07-10 (×8): qty 3

## 2020-07-10 NOTE — Anesthesia Postprocedure Evaluation (Signed)
Anesthesia Post Note  Patient: Sabrina Hudson  Procedure(s) Performed: RIGHT TOTAL HIP ARTHROPLASTY ANTERIOR APPROACH (Right Hip)  Patient location during evaluation: Nursing Unit Anesthesia Type: Spinal Level of consciousness: awake, awake and alert and oriented Pain management: pain level controlled Vital Signs Assessment: post-procedure vital signs reviewed and stable Respiratory status: spontaneous breathing, nonlabored ventilation and respiratory function stable Cardiovascular status: blood pressure returned to baseline and stable Postop Assessment: no headache and no backache   No complications documented.   Last Vitals:  Vitals:   07/10/20 0724 07/10/20 0736  BP: 124/69   Pulse: 75 73  Resp: 15 16  Temp: 37.3 C   SpO2: 96% 93%    Last Pain:  Vitals:   07/10/20 0724  TempSrc: Oral  PainSc: 6                  [employer]

## 2020-07-10 NOTE — Evaluation (Signed)
Physical Therapy Evaluation Patient Details Name: Sabrina Hudson MRN: [id number] DOB: [birth year] Today's Date: 07/10/2020   History of Present Illness  Pt is a 69 yo female diagnosed with OA of the R hip and is s/p elective R THA.  PMH includes COPD, GERD, HTN, and A-fib.    Clinical Impression  Pt pleasant and motivated to participate during the session.  Pt put forth very good effort throughout and did not need physical assistance with any functional task.  Pt was steady with transfers from various height surfaces and was able to amb 2 x 15 feet with gradually improving RLE stance time and without LOB.  Pt's SpO2 ranged from 88-90% on room air during the session with nursing notified.  Pt is expected to continue to make good progress towards goals while in acute care and will benefit from HHPT services upon discharge to safely address deficits listed in patient problem list for decreased caregiver assistance and eventual return to PLOF.      Follow Up Recommendations Home health PT;Supervision for mobility/OOB    Equipment Recommendations  3in1 (PT)    Recommendations for Other Services       Precautions / Restrictions Precautions Precautions: Anterior Hip;Fall Precaution Booklet Issued: Yes (comment) Restrictions Weight Bearing Restrictions: Yes RLE Weight Bearing: Weight bearing as tolerated Other Position/Activity Restrictions: Monitor SpO2      Mobility  Bed Mobility Overal bed mobility: Modified Independent             General bed mobility comments: Extra time and effort  Transfers Overall transfer level: Needs assistance Equipment used: Rolling walker (2 wheeled) Transfers: Sit to/from Stand Sit to Stand: Min guard         General transfer comment: Mod verbal cues for hand and foot placement  Ambulation/Gait Ambulation/Gait assistance: Min guard Gait Distance (Feet): 15 Feet x 2 Assistive device: Rolling walker (2 wheeled) Gait Pattern/deviations:  Step-to pattern;Decreased step length - left;Decreased stance time - right;Antalgic;Trunk flexed Gait velocity: decreased   General Gait Details: Slow cadence with effortful step-to pattern with cadence and RLE stance time slowly progressing during the session.  Stairs            Wheelchair Mobility    Modified Rankin (Stroke Patients Only)       Balance Overall balance assessment: Needs assistance   Sitting balance-Leahy Scale: Good     Standing balance support: Bilateral upper extremity supported;During functional activity Standing balance-Leahy Scale: Good                               Pertinent Vitals/Pain Pain Assessment: 0-10 Pain Score: 4  Pain Location: R hip Pain Descriptors / Indicators: Sore;Aching Pain Intervention(s): Premedicated before session;Monitored during session    Home Living Family/patient expects to be discharged to:: Private residence Living Arrangements: Other relatives (Sister) Available Help at Discharge: Family;Available 24 hours/day;Other (Comment) (Sister is a retired Therapist, sports) Type of Home: [employer] Access: Stairs to enter Entrance Stairs-Rails: None Technical brewer of Steps: 1 Brenda: Two level;Able to live on main level with bedroom/bathroom Home Equipment: Kasandra Knudsen - single point;Shower seat;Walker - 2 wheels Additional Comments: Pt and her sister live together. They have an alarm system to alert one another if one of them needs assistance.    Prior Function Level of Independence: Independent with assistive device(s)         Comments: Pt reports indep in ADL/IADL mgt. Mod Ind amb with  a SPC limited community distances, had one fall last year off of a ladder. She still drives and works part-time from home. Loves fishing, walking, kayaking and playing with her dog.     Hand Dominance   Dominant Hand: Right    Extremity/Trunk Assessment   Upper Extremity Assessment Upper Extremity Assessment: Overall WFL  for tasks assessed    Lower Extremity Assessment Lower Extremity Assessment: Generalized weakness;RLE deficits/detail RLE: Unable to fully assess due to pain RLE Sensation: WNL       Communication   Communication: HOH  Cognition Arousal/Alertness: Awake/alert Behavior During Therapy: WFL for tasks assessed/performed Overall Cognitive Status: Within Functional Limits for tasks assessed                                        General Comments General comments (skin integrity, edema, etc.): RN arrived during session to address pt c/o SOB and general fatigue. Pt SpO2 reported to be 88-89%, normally is >97% per sister.    Exercises Total Joint Exercises Ankle Circles/Pumps: AROM;Strengthening;Both;15 reps Quad Sets: Strengthening;Both;15 reps Gluteal Sets: Strengthening;Both;15 reps Hip ABduction/ADduction: AAROM;Right;10 reps (low amplitude) Straight Leg Raises: AAROM;Right;10 reps Long Arc Quad: Strengthening;Both;10 reps Knee Flexion: Strengthening;Both;10 reps Marching in Standing: AROM;Strengthening;Both;5 reps;Standing Other Exercises Other Exercises: HEP education per handout Other Exercises: Gait training with cues for sequencing   Assessment/Plan    PT Assessment Patient needs continued PT services  PT Problem List Decreased strength;Decreased activity tolerance;Decreased balance;Decreased mobility;Decreased knowledge of use of DME;Pain;Decreased knowledge of precautions       PT Treatment Interventions DME instruction;Gait training;Stair training;Functional mobility training;Therapeutic activities;Therapeutic exercise;Balance training;Patient/family education    PT Goals (Current goals can be found in the Care Plan section)  Acute Rehab PT Goals Patient Stated Goal: To be able to walk her dog again PT Goal Formulation: With patient Time For Goal Achievement: 07/23/20 Potential to Achieve Goals: Good    Frequency BID   Barriers to discharge         Co-evaluation               AM-PAC PT "6 Clicks" Mobility  Outcome Measure Help needed turning from your back to your side while in a flat bed without using bedrails?: A Little Help needed moving from lying on your back to sitting on the side of a flat bed without using bedrails?: A Little Help needed moving to and from a bed to a chair (including a wheelchair)?: A Little Help needed standing up from a chair using your arms (e.g., wheelchair or bedside chair)?: A Little Help needed to walk in hospital room?: A Little Help needed climbing 3-5 steps with a railing? : A Lot 6 Click Score: 17    End of Session Equipment Utilized During Treatment: Gait belt Activity Tolerance: Patient tolerated treatment well Patient left: in chair;with call bell/phone within reach;with chair alarm set;with SCD's reapplied;with family/visitor present Nurse Communication: Mobility status PT Visit Diagnosis: Other abnormalities of gait and mobility (R26.89);Muscle weakness (generalized) (M62.81);Pain Pain - Right/Left: Right Pain - part of body: Hip    Time: 1102-1117 PT Time Calculation (min) (ACUTE ONLY): 37 min   Charges:   PT Evaluation $PT Eval Moderate Complexity: 1 Mod PT Treatments $Therapeutic Exercise: 8-22 mins        D. Scott Jafar Poffenberger PT, DPT 07/10/20, 2:15 PM

## 2020-07-10 NOTE — Evaluation (Signed)
Occupational Therapy Evaluation Patient Details Name: Sabrina Hudson MRN: [id number] DOB: 1951-11-19 Today's Date: 07/10/2020    History of Present Illness Pt is a 69 y/o female who presents s/p R THA (anterior approach). PMH includes COPD, former smoker, HTN, AFib, anxiety, GERD and COVID (12/2019). She lives with her sister and is independent in ADL at baseline.   Clinical Impression   Pt seen for OT evaluation this date, POD#1 from above surgery. Pt was independent in all ADL prior to surgery, using an [facility] for mobility due to R hip pain. She works part-time from home, enjoys being active outdoors (walking her dog, kayaking and fishing) and lives with her sister who is available for 24/7 assistance. Pt is eager to return to PLOF with less pain and improved safety and independence. During evaluation, pt presented with increasing lethargy and fatigue, eyes closing throughout (suspect due to medication and prior PT session). RN arrived to address pt SOB with mobility during PT session (reported SpO2 88-89%). ADL and functional mobility deferred this date. Pt reported she ambulated to the bathroom with PT using a RW and demonstrated UB strength WFL. Given presentation, anticipate MIN-MOD A for LB dressing/bathing due to pain and limited AROM of R hip. Pt and caregiver educated re: role of OT in acute care, compression stocking mgt, falls prevention strategies, use of AE for LBD, showering adaptations and home/routine modifications. Pt and caregiver both receptive and verbalized understanding. Pt would benefit from demonstrations of adaptive strategies in self care skills and techniques with or without assistive devices to support recall and carryover prior to discharge. Recommend HHOT upon discharge.     Follow Up Recommendations  Supervision - Intermittent;Home health OT (To reassess next session)    Equipment Recommendations  None recommended by OT    Recommendations for Other Services        Precautions / Restrictions Precautions Precautions: Anterior Hip;Fall Precaution Booklet Issued: No Restrictions Weight Bearing Restrictions: Yes RLE Weight Bearing: Weight bearing as tolerated Other Position/Activity Restrictions: pt with SOB, monitor SpO2      Mobility Bed Mobility               General bed mobility comments: pt up in recliner at start/end of session  Transfers                 General transfer comment: deferred 2/2 pt fatigue and lethargy    Balance Overall balance assessment: Needs assistance                                         ADL either performed or assessed with clinical judgement   ADL Overall ADL's : Needs assistance/impaired                                       General ADL Comments: Pt deferred ADL and functional mobility this session due to lethargy and fatigue. Reported ambulating to the toilet with PT earlier this am using a RW. Given pt functional limitations, anticipate MIN-MOD A for LB ADL and SETUP A for UB ADL.     Vision Baseline Vision/History: Wears glasses Wears Glasses: At all times       Perception     Praxis      Pertinent Vitals/Pain Pain Assessment: Faces Faces Pain Scale: Hurts a  little bit Pain Location: R hip Pain Descriptors / Indicators: Discomfort Pain Intervention(s): Limited activity within patient's tolerance;Monitored during session;Ice applied     Hand Dominance Right   Extremity/Trunk Assessment Upper Extremity Assessment Upper Extremity Assessment: Generalized weakness;Overall WFL for tasks assessed   Lower Extremity Assessment Lower Extremity Assessment: Defer to PT evaluation       Communication Communication Communication: HOH (verbalizes that she can read lips)   Cognition Arousal/Alertness: Awake/alert Behavior During Therapy: WFL for tasks assessed/performed Overall Cognitive Status: Within Functional Limits for tasks assessed                                  General Comments: Pt alert and oriented, though lethargic and fatigued with eyes closing throughout (suspect from activity in prior PT session and medication).   General Comments  RN arrived during session to address pt c/o SOB and general fatigue. Pt SpO2 reported to be 88-89%, normally is >97% per sister.    Exercises Other Exercises Other Exercises: Pt and caregiver educated re: role of OT in acute care, compression stocking mgt, falls prevention strategies, use of AE for LBD, showering adaptations and home/routine modifications. Pt and caregiver both receptive and verbalized understanding.   Shoulder Instructions      Home Living Family/patient expects to be discharged to:: Private residence Living Arrangements: Other relatives (sister) Available Help at Discharge: Family;Available 24 hours/day (sister is retired and available) Type of Home: House Home Access: Stairs to enter [employer] of Steps: 1   [place]: Two level;Able to live on main level with bedroom/bathroom     Bathroom Shower/Tub: Occupational psychologist: Handicapped height     Home Equipment: [place] - single point;Shower seat;Walker - 2 wheels   Additional Comments: Pt and her sister live together. They have an alarm system to alert one another if one of them needs assistance.      Prior Functioning/Environment Level of Independence: Independent with assistive device(s)        Comments: Pt reports indep in ADL/IADL mgt. She uses an [facility] for mobility and had one fall last year off of a ladder. She still drives and works part-time from home. Loves fishing, walking, kayaking and playing with her dog.        OT Problem List: Decreased strength;Decreased range of motion;Impaired balance (sitting and/or standing);Decreased knowledge of use of DME or AE;Pain      OT Treatment/Interventions: Self-care/ADL training;Therapeutic exercise;Therapeutic  activities;Energy conservation;DME and/or AE instruction;Patient/family education;Balance training    OT Goals(Current goals can be found in the care plan section) Acute Rehab OT Goals Patient Stated Goal: To be able to walk her dog again OT Goal Formulation: With patient Time For Goal Achievement: 07/24/20 Potential to Achieve Goals: Good ADL Goals Pt Will Perform Lower Body Dressing: with modified independence;sit to/from stand;with adaptive equipment (using LRAD) Pt Will Transfer to Toilet: with modified independence;ambulating;regular height toilet;bedside commode (BSC over toilet; using LRAD) Additional ADL Goal #1: Caregiver will independently demonstrate compression stocking mgt in order to maximize pt safety  OT Frequency: Min 2X/week   Barriers to D/C:            Co-evaluation              AM-PAC OT "6 Clicks" Daily Activity     Outcome Measure Help from another person eating meals?: None Help from another person taking care of personal grooming?: None Help  from another person toileting, which includes using toliet, bedpan, or urinal?: A Little Help from another person bathing (including washing, rinsing, drying)?: A Lot Help from another person to put on and taking off regular upper body clothing?: None Help from another person to put on and taking off regular lower body clothing?: A Lot 6 Click Score: 19   End of Session Nurse Communication: Other (comment) (Pt SOB and HOH status)  Activity Tolerance: Patient limited by fatigue;Patient limited by lethargy Patient left: in chair;with call bell/phone within reach;with nursing/sitter in room;with SCD's reapplied  OT Visit Diagnosis: Other abnormalities of gait and mobility (R26.89);Muscle weakness (generalized) (M62.81);Pain Pain - Right/Left: Right Pain - part of body: Hip                Time: 6060-0459 OT Time Calculation (min): 18 min Charges:  OT General Charges $OT Visit: 1 Visit OT Evaluation $OT Eval  Moderate Complexity: 1 Mod OT Treatments $Self Care/Home Management : 8-22 mins  Jerilynn Birkenhead, OTS 07/10/20, 10:23 AM

## 2020-07-10 NOTE — TOC Initial Note (Signed)
Transition of Care (TOC) - Initial/Assessment Note  ° ° °Patient Details  °Name: Sabrina Hudson °MRN: [id number] °Date of Birth: 04/06/1951 ° °Transition of Care (TOC) CM/SW Contact:    °Dupree, Rebecca G, LCSW °Phone Number: °07/10/2020, 10:42 AM ° °Clinical Narrative:  Met with pt and sister was in the room sister can hear better than pt can-since pt lip reads and difficult with masks on. Pt was independent prior to admission and used a rw outside. Sister lives with and can assist with her care. She can transport pt to appointments and assist with home management until pt heals. Made aware [facility] to follow at DC and she seems to have her equipment. Follow along until DC                ° ° °Expected Discharge Plan: Home w Home Health Services °Barriers to Discharge: Other (comment) (Lip reads-difficult to communicate with offered sign she does not do this) ° ° °Patient Goals and CMS Choice °Patient states their goals for this hospitalization and ongoing recovery are:: Go home soon with my sister °CMS Medicare.gov Compare Post Acute Care list provided to:: Patient °Choice offered to / list presented to : Patient ° °Expected Discharge Plan and Services °Expected Discharge Plan: Home w Home Health Services °In-house Referral: Clinical Social Work °  °Post Acute Care Choice: Home Health °Living arrangements for the past 2 months: Single Family Home °                °  °  °  °  °  °HH Arranged: PT °HH Agency: [facility] (now [facility]) °Date HH Agency Contacted: 07/10/20 °Time HH Agency Contacted: 1040 °Representative spoke with at HH Agency: teresa ° °Prior Living Arrangements/Services °Living arrangements for the past 2 months: Single Family Home °Lives with:: Siblings (sister) °Patient language and need for interpreter reviewed:: No °Do you feel safe going back to the place where you live?: Yes      °Need for Family Participation in Patient Care: No (Comment) °Care giver support system in place?: Yes  (comment) °Current home services: DME (elevated commode and rw) °Criminal Activity/Legal Involvement Pertinent to Current Situation/Hospitalization: No - Comment as needed ° °Activities of Daily Living °Home Assistive Devices/Equipment: Walker (specify type) °ADL Screening (condition at time of admission) °Patient's cognitive ability adequate to safely complete daily activities?: No °Is the patient deaf or have difficulty hearing?: Yes °Does the patient have difficulty seeing, even when wearing glasses/contacts?: No °Does the patient have difficulty concentrating, remembering, or making decisions?: No °Patient able to express need for assistance with ADLs?: Yes °Does the patient have difficulty dressing or bathing?: No (Per pt, she has helped with equiptment at home) °Independently performs ADLs?: Yes (appropriate for developmental age) °Does the patient have difficulty walking or climbing stairs?: Yes °Weakness of Legs: Right °Weakness of Arms/Hands: None ° °Permission Sought/Granted °Permission sought to share information with : Family Supports, Facility Contact Representative °Permission granted to share information with : Yes, Verbal Permission Granted ° Share Information with NAME: Deborah ° Permission granted to share info w AGENCY: [facility] ° Permission granted to share info w Relationship: sister ° Permission granted to share info w Contact Information: teresa ° °Emotional Assessment °Appearance:: Appears stated age °Attitude/Demeanor/Rapport: Gracious °Affect (typically observed): Accepting °Orientation: : Oriented to Self, Oriented to Place, Oriented to  Time, Oriented to Situation °Alcohol / Substance Use: Never Used °Psych Involvement: No (comment) ° °Admission diagnosis:  S/P hip replacement [Z96.649] °  Patient Active Problem List  ° Diagnosis Date Noted  °• S/P hip replacement 07/09/2020  °• Smoking   °• Atrial fibrillation with RVR (HCC) 05/21/2020  °• COPD with acute exacerbation (HCC) 05/20/2020  °•  HTN (hypertension) 05/20/2020  °• COPD exacerbation (HCC) 05/20/2020  ° °PCP:  Babaoff, Marcus, MD °Pharmacy:   °[id number] - [place], [place] - [address] °[address] °[place] [id number] °Phone: [phone] Fax: [phone] ° ° ° ° °Social Determinants of Health (SDOH) Interventions °  ° °Readmission Risk Interventions °No flowsheet data found. ° °

## 2020-07-10 NOTE — Progress Notes (Signed)
Physical Therapy Treatment Patient Details Name: Sabrina Hudson MRN: [id number] DOB: [birth year] Today's Date: 07/10/2020    History of Present Illness Pt is a 69 yo female diagnosed with OA of the R hip and is s/p elective R THA.  PMH includes COPD, GERD, HTN, and A-fib.    PT Comments    Pt pleasant and motivated to participate during the session. Pt reported pain 8/10 at beginning of session and states that she would like more medication, to which the RN was notified. Pt O2 sats were taken at the beginning of session and briefly registered around 85% and continued to be monitored throughout session with O2 primarily 88-91% throughout the session. Pt performed well with bed mobility and was able to bring herself from supine to sitting with min cueing. Pt additionally performed supine and sitting exercises with min cueing for instruction to breathe between reps. Pt was able to come from sit to stand and ambulate 59ft to chair with min guard. Pt finished the session with sitting exercises and was given education to try and perform regularly throughout the day. Pt will benefit from HHPT services upon discharge to safely address deficits listed in patient problem list for decreased caregiver assistance and eventual return to PLOF.   Follow Up Recommendations  Home health PT;Supervision for mobility/OOB     Equipment Recommendations  3in1 (PT)    Recommendations for Other Services       Precautions / Restrictions Precautions Precautions: Anterior Hip;Fall Precaution Booklet Issued: Yes (comment) Restrictions Weight Bearing Restrictions: Yes RLE Weight Bearing: Weight bearing as tolerated Other Position/Activity Restrictions: Monitor SpO2    Mobility  Bed Mobility Overal bed mobility: Modified Independent             General bed mobility comments: Extra time and effort  Transfers Overall transfer level: Needs assistance Equipment used: Rolling walker (2  wheeled) Transfers: Sit to/from Stand Sit to Stand: Min guard         General transfer comment: Min verbal cues for hand and foot placement  Ambulation/Gait Ambulation/Gait assistance: Min guard Gait Distance (Feet): 10 Feet Assistive device: Rolling walker (2 wheeled) Gait Pattern/deviations: Step-to pattern;Decreased step length - left;Decreased stance time - right;Antalgic;Trunk flexed Gait velocity: decreased   General Gait Details: Slow cadence with effortful step-to pattern with cadence and RLE stance time slowly progressing during the session.   Stairs             Wheelchair Mobility    Modified Rankin (Stroke Patients Only)       Balance Overall balance assessment: Needs assistance Sitting-balance support: No upper extremity supported;Feet unsupported Sitting balance-Leahy Scale: Good     Standing balance support: Bilateral upper extremity supported;During functional activity Standing balance-Leahy Scale: Good                              Cognition Arousal/Alertness: Awake/alert Behavior During Therapy: WFL for tasks assessed/performed Overall Cognitive Status: Within Functional Limits for tasks assessed                                        Exercises Total Joint Exercises Ankle Circles/Pumps: AROM;Strengthening;Both;15 reps;10 reps Quad Sets: Strengthening;Both;15 reps;10 reps Gluteal Sets: Strengthening;Both;15 reps Hip ABduction/ADduction: AAROM;Right;10 reps (low amplitude) Straight Leg Raises: AAROM;Right;10 reps;AROM;Left;5 reps Long Arc Quad: Strengthening;Both;10 reps Knee Flexion: Strengthening;Both;10 reps Marching in Standing:  AROM;Strengthening;Both;Standing;10 reps Other Exercises Other Exercises:  Other Exercises: Gait training with cues for sequencing    General Comments        Pertinent Vitals/Pain Pain Assessment: 0-10 Pain Score: 8  Pain Location: R hip Pain Descriptors / Indicators:  Sore;Aching;Grimacing Pain Intervention(s): Monitored during session;Premedicated before session;Repositioned;Patient requesting pain meds-RN notified    Home Living Family/patient expects to be discharged to:: Private residence Living Arrangements: Other relatives (Sister) Available Help at Discharge: Family;Available 24 hours/day;Other (Comment) (Sister is a retired Therapist, sports) Type of Home: [employer] Access: Stairs to enter Entrance Stairs-Rails: None Home Layout: Two level;Able to live on main level with bedroom/bathroom Home Equipment: [employer] - single point;Shower seat;Walker - 2 wheels Additional Comments: Pt and her sister live together. They have an alarm system to alert one another if one of them needs assistance.    Prior Function Level of Independence: Independent with assistive device(s)      Comments: Pt reports indep in ADL/IADL mgt. Mod Ind amb with a SPC limited community distances, had one fall last year off of a ladder. She still drives and works part-time from home. Loves fishing, walking, kayaking and playing with her dog.   PT Goals (current goals can now be found in the care plan section) Acute Rehab PT Goals Patient Stated Goal: To be able to walk her dog again PT Goal Formulation: With patient Time For Goal Achievement: 07/23/20 Potential to Achieve Goals: Good Progress towards PT goals: Progressing toward goals    Frequency    BID      PT Plan Current plan remains appropriate    Co-evaluation              AM-PAC PT "6 Clicks" Mobility   Outcome Measure  Help needed turning from your back to your side while in a flat bed without using bedrails?: A Little Help needed moving from lying on your back to sitting on the side of a flat bed without using bedrails?: A Little Help needed moving to and from a bed to a chair (including a wheelchair)?: A Little Help needed standing up from a chair using your arms (e.g., wheelchair or bedside chair)?: A Little Help  needed to walk in hospital room?: A Little Help needed climbing 3-5 steps with a railing? : A Lot 6 Click Score: 17    End of Session Equipment Utilized During Treatment: Gait belt Activity Tolerance: Patient tolerated treatment well Patient left: in chair;with call bell/phone within reach;with chair alarm set;with SCD's reapplied;with family/visitor present Nurse Communication: Mobility status;Other (comment) (SpO2 briefly at 85 at the beginnning of session; hovered around 91% for most of session) PT Visit Diagnosis: Other abnormalities of gait and mobility (R26.89);Muscle weakness (generalized) (M62.81);Pain Pain - Right/Left: Right Pain - part of body: Hip     Time: 4967-5916 PT Time Calculation (min) (ACUTE ONLY): 23 min  Charges:  $Therapeutic Exercise: 8-22 mins                     Chrisanne Loose SPT 07/10/20, 4:56 PM

## 2020-07-10 NOTE — Progress Notes (Signed)
   Subjective: 1 Day Post-Op Procedure(s) (LRB): RIGHT TOTAL HIP ARTHROPLASTY ANTERIOR APPROACH (Right) Patient reports pain as moderate.   Patient is well, and has had no acute complaints or problems Denies any CP, SOB, ABD pain. We will continue therapy today.  Plan is to go Home after hospital stay.  Objective: Vital signs in last 24 hours: Temp:  [97.1 F (36.2 C)-99.3 F (37.4 C)] 99.1 F (37.3 C) (07/14 0724) Pulse Rate:  [49-75] 73 (07/14 0736) Resp:  [9-18] 16 (07/14 0736) BP: (91-143)/(52-104) 124/69 (07/14 0724) SpO2:  [93 %-100 %] 93 % (07/14 0736)  Intake/Output from previous day: 07/13 0701 - 07/14 0700 In: 2100 [P.O.:300; I.V.:1700; IV Piggyback:100] Out: 2550 [Urine:2050; Blood:500] Intake/Output this shift: No intake/output data recorded.  Recent Labs    07/10/20 0410  HGB 11.6*   Recent Labs    07/10/20 0410  WBC 10.5  RBC 3.66*  HCT 33.3*  PLT 206   Recent Labs    07/09/20 1832  NA 142  K 3.4*  CL 107  CO2 27  BUN 9  CREATININE 0.72  GLUCOSE 139*  CALCIUM 8.5*   No results for input(s): LABPT, INR in the last 72 hours.  EXAM General - Patient is Alert, Appropriate and Oriented Extremity - Neurovascular intact Sensation intact distally Intact pulses distally Dorsiflexion/Plantar flexion intact No cellulitis present Compartment soft Dressing - dressing C/D/I and no drainage Motor Function - intact, moving foot and toes well on exam.   Past Medical History:  Diagnosis Date  . Anxiety   . Arthritis   . COPD (chronic obstructive pulmonary disease) ([place])   . GERD (gastroesophageal reflux disease)   . History of 2019 novel coronavirus disease (COVID-19) 01/23/2020  . Hypertension     Assessment/Plan:   1 Day Post-Op Procedure(s) (LRB): RIGHT TOTAL HIP ARTHROPLASTY ANTERIOR APPROACH (Right) Active Problems:   S/P hip replacement  Estimated body mass index is 28.98 kg/m as calculated from the following:   Height as of  06/28/20: 5\' 7"  (1.702 m).   Weight as of 06/28/20: 83.9 kg. Advance diet Up with therapy  Needs BM Labs and VSS CM to assist with discharge to home with HHPT  DVT Prophylaxis - TED hose and SCDs, Eliquis Weight-Bearing as tolerated to right leg   T. Rachelle Hora, PA-C [place] 07/10/2020, 8:03 AM

## 2020-07-11 LAB — CBC
HCT: 32.9 % — ABNORMAL LOW (ref 36.0–46.0)
Hemoglobin: 11.7 g/dL — ABNORMAL LOW (ref 12.0–15.0)
MCH: 32.2 pg (ref 26.0–34.0)
MCHC: 35.6 g/dL (ref 30.0–36.0)
MCV: 90.6 fL (ref 80.0–100.0)
Platelets: 178 10*3/uL (ref 150–400)
RBC: 3.63 MIL/uL — ABNORMAL LOW (ref 3.87–5.11)
RDW: 13.5 % (ref 11.5–15.5)
WBC: 11.1 10*3/uL — ABNORMAL HIGH (ref 4.0–10.5)
nRBC: 0 % (ref 0.0–0.2)

## 2020-07-11 LAB — SURGICAL PATHOLOGY

## 2020-07-11 MED ORDER — SODIUM CHLORIDE 0.9 % IV BOLUS
500.0000 mL | Freq: Once | INTRAVENOUS | Status: AC
  Administered 2020-07-11: 500 mL via INTRAVENOUS

## 2020-07-11 NOTE — TOC Progression Note (Signed)
Transition of Care [facility]) - Progression Note    Patient Details  Name: Sabrina Hudson MRN: [id number] Date of Birth: February 06, 1951  Transition of Care [facility]) CM/SW Contact  Arty Lantzy, Gardiner Rhyme, LCSW Phone Number: 07/11/2020, 1:41 PM  Clinical Narrative:   Met with pt and spoke with sister via telephone to discuss change in PT's recommendation to SNF. Pt feels she will need some rehab before going home she feels it has been a slow go today. She is having much pain today in her hip. Both in agreement with plan and will begin FL2 and bed search. Sister prefers [facility] if has a bed due to close to them. Will begin process.    Expected Discharge Plan: [place] Services Barriers to Discharge: Other (comment) (Lip reads-difficult to communicate with offered sign she does not do this)  Expected Discharge Plan and Services Expected Discharge Plan: [place] In-house Referral: Clinical Social Work   Post Acute Care Choice: [place] arrangements for the past 2 months: [place]: PT [place]: [facility]) Date [place]: 07/10/20 Time [place]: 1040 Representative spoke with at [place]: teresa   Social Determinants of Health ([place]) Interventions    Readmission Risk Interventions No flowsheet data found.

## 2020-07-11 NOTE — NC FL2 (Signed)
[name]     IDENTIFICATION  Patient Name: Sabrina Hudson Birthdate: 19-Apr-1951 Sex: female Admission Date (Current Location): 07/09/2020  [place] and [place] Number:  Engineering geologist and Address:  [name], [address], [name], [name] [address]      Provider Number: [id number]  Attending Physician Name and Address:  Hessie Knows, MD  Relative Name and Phone Number:  Paulino Rily [phone]    Current Level of Care: Hospital Recommended Level of Care: [place] Prior Approval Number:    Date Approved/Denied:   PASRR Number: [id number]  Discharge Plan: SNF    Current Diagnoses: Patient Active Problem List   Diagnosis Date Noted  . S/P hip replacement 07/09/2020  . Smoking   . Atrial fibrillation with RVR ([name]) 05/21/2020  . COPD with acute exacerbation ([name]) 05/20/2020  . HTN (hypertension) 05/20/2020  . COPD exacerbation ([name]) 05/20/2020    Orientation RESPIRATION BLADDER Height & Weight     Self, Time, Situation, Place  Normal Continent Weight:   Height:     BEHAVIORAL SYMPTOMS/MOOD NEUROLOGICAL BOWEL NUTRITION STATUS      Continent Diet (Full Code)  AMBULATORY STATUS COMMUNICATION OF NEEDS Skin   Limited Assist Verbally Surgical wounds                       Personal Care Assistance Level of Assistance  Bathing, Dressing Bathing Assistance: Limited assistance   Dressing Assistance: Limited assistance     Functional Limitations Info  Hearing   Hearing Info: Impaired      SPECIAL CARE FACTORS FREQUENCY  PT (By licensed PT), OT (By licensed OT)     PT Frequency: 5x week OT Frequency: 5x week            Contractures Contractures Info: Not present    Additional Factors Info  Code Status, Allergies Code Status Info: Full Code Allergies Info: NKDA           Current Medications (07/11/2020):  This is the current hospital  active medication list Current Facility-Administered Medications  Medication Dose Route Frequency Provider Last Rate Last Admin  . 0.9 %  sodium chloride infusion   Intravenous Continuous Hessie Knows, MD   Stopped at 07/09/20 1847  . acetaminophen (TYLENOL) tablet 325-650 mg  325-650 mg Oral Q6H PRN Hessie Knows, MD      . albuterol (PROVENTIL) (2.5 MG/3ML) 0.083% nebulizer solution 2.5 mg  2.5 mg Inhalation Q6H PRN Hessie Knows, MD      . alum & mag hydroxide-simeth (MAALOX/MYLANTA) 200-200-20 MG/5ML suspension 30 mL  30 mL Oral Q4H PRN Hessie Knows, MD      . apixaban Arne [place]) tablet 5 mg  5 mg Oral BID Hessie Knows, MD   5 mg at 07/11/20 0912  . bisacodyl (DULCOLAX) suppository 10 mg  10 mg Rectal Daily PRN Hessie Knows, MD      . diltiazem (CARDIZEM CD) 24 hr capsule 180 mg  180 mg Oral Daily Hessie Knows, MD   180 mg at 07/11/20 1033  . diltiazem (CARDIZEM) tablet 30 mg  30 mg Oral Daily PRN Hessie Knows, MD      . diphenhydrAMINE (BENADRYL) 12.5 MG/5ML elixir 12.5-25 mg  12.5-25 mg Oral Q4H PRN Hessie Knows, MD      . docusate sodium (COLACE) capsule 100 mg  100 mg Oral BID Hessie Knows, MD   100 mg at 07/11/20 0912  .  DULoxetine (CYMBALTA) DR capsule 60 mg  60 mg Oral Daily Hessie Knows, MD   60 mg at 07/11/20 0918  . fluticasone (FLONASE) 50 MCG/ACT nasal spray 1 spray  1 spray Each Nare Daily PRN Hessie Knows, MD      . irbesartan (AVAPRO) tablet 75 mg  75 mg Oral Daily Hessie Knows, MD   75 mg at 07/10/20 1000   And  . hydrochlorothiazide (MICROZIDE) capsule 12.5 mg  12.5 mg Oral Daily Hessie Knows, MD   12.5 mg at 07/10/20 1003  . HYDROcodone-acetaminophen (NORCO) 7.5-325 MG per tablet 1-2 tablet  1-2 tablet Oral Q4H PRN Hessie Knows, MD   2 tablet at 07/09/20 2106  . HYDROcodone-acetaminophen (NORCO/VICODIN) 5-325 MG per tablet 1-2 tablet  1-2 tablet Oral Q4H PRN Hessie Knows, MD   2 tablet at 07/10/20 0744  . ipratropium-albuterol (DUONEB) 0.5-2.5 (3) MG/3ML  nebulizer solution 3 mL  3 mL Nebulization TID Hessie Knows, MD   3 mL at 07/11/20 0739  . magnesium hydroxide (MILK OF MAGNESIA) suspension 30 mL  30 mL Oral Daily PRN Hessie Knows, MD   30 mL at 07/10/20 0744  . menthol-cetylpyridinium (CEPACOL) lozenge 3 mg  1 lozenge Oral PRN Hessie Knows, MD       Or  . phenol (CHLORASEPTIC) mouth spray 1 spray  1 spray Mouth/Throat PRN Hessie Knows, MD      . methocarbamol (ROBAXIN) tablet 500 mg  500 mg Oral Q6H PRN Hessie Knows, MD   500 mg at 07/11/20 0911   Or  . methocarbamol (ROBAXIN) 500 mg in dextrose 5 % 50 mL IVPB  500 mg Intravenous Q6H PRN Hessie Knows, MD      . metoCLOPramide (REGLAN) tablet 5-10 mg  5-10 mg Oral Q8H PRN Hessie Knows, MD       Or  . metoCLOPramide (REGLAN) injection 5-10 mg  5-10 mg Intravenous Q8H PRN Hessie Knows, MD      . metoprolol tartrate (LOPRESSOR) tablet 25 mg  25 mg Oral BID Hessie Knows, MD   25 mg at 07/11/20 0912  . morphine 2 MG/ML injection 0.5-1 mg  0.5-1 mg Intravenous Q2H PRN Hessie Knows, MD   1 mg at 07/09/20 2106  . ondansetron (ZOFRAN) tablet 4 mg  4 mg Oral Q6H PRN Hessie Knows, MD       Or  . ondansetron [facility]) injection 4 mg  4 mg Intravenous Q6H PRN Hessie Knows, MD      . pantoprazole (PROTONIX) EC tablet 80 mg  80 mg Oral Daily Hessie Knows, MD   80 mg at 07/11/20 0917  . traMADol (ULTRAM) tablet 50 mg  50 mg Oral Q6H Hessie Knows, MD   50 mg at 07/11/20 1246  . zolpidem (AMBIEN) tablet 5 mg  5 mg Oral QHS PRN Hessie Knows, MD         Discharge Medications: Please see discharge summary for a list of discharge medications.  Relevant Imaging Results:  Relevant Lab Results:   Additional Information SSN: [id number]  Sabrina Hudson, Sabrina Rhyme, LCSW

## 2020-07-11 NOTE — Progress Notes (Signed)
Physical Therapy Treatment Patient Details Name: Sabrina Hudson MRN: [id number] DOB: 07/07/1951 Today's Date: 07/11/2020    History of Present Illness Pt is a 69 yo female diagnosed with OA of the R hip and is s/p elective R THA.  PMH includes COPD, GERD, HTN, and A-fib.    PT Comments    Pt pleasant and motivated to participate during the session. Pt reported no pain at the beginning of session in chair with legs extended. Pt demonstrated excellent carryover with seated exercises and was able to perform without cueing. Pt appeared to be in excruciating pain when legs were lowered from extended position in chair. Pt had difficulty with LAQ, likely secondary to pain. Pt was able to come from sit to stand with min cueing and min guard. Pt appeared to regress with standing mobility and ambulation. During today's session, pt had increased difficulty with standing marches, with minimal ability to lift RLE secondary to pain and minimal ability to lift LLE secondary to inability to bear much weight through RLE, again secondary to pain. Pt reported 8/10 pain in standing. Pt was willing to attempt ambulation, with chair following behind if she needed to sit. Pt was able to ambulate ~76ft with short, shuffling, steps bilaterally and significantly decreased gait speed before asking to sit down. Pt SpO2 and HR were monitored periodically throughout session, with SpO2 measuring initially between 84-88% in sitting at beginning of session and improving to high 80s-low 90s throughout activity. SpO2 91% in sitting at end of session. Pt reported less pain once back in sitting with legs extended. Due to regression with function in standing and ambulation, d/c recommendations have been updated. Pt will benefit from PT services in a SNF setting upon discharge to safely address deficits listed in patient problem list for decreased caregiver assistance and eventual return to PLOF.   Follow Up Recommendations   SNF;Supervision for mobility/OOB     Equipment Recommendations  3in1 (PT)    Recommendations for Other Services       Precautions / Restrictions Precautions Precautions: Anterior Hip;Fall Precaution Booklet Issued: Yes (comment) Restrictions Weight Bearing Restrictions: Yes RLE Weight Bearing: Weight bearing as tolerated Other Position/Activity Restrictions: monitor SpO2    Mobility  Bed Mobility Overal bed mobility: Modified Independent             General bed mobility comments: not assessed with PT today  Transfers Overall transfer level: Needs assistance Equipment used: Rolling walker (2 wheeled) Transfers: Sit to/from Stand Sit to Stand: Min guard         General transfer comment: min verbal cues for foot and hand placement w/ min guard.  Ambulation/Gait Ambulation/Gait assistance: Min guard Gait Distance (Feet): 15 Feet Assistive device: Rolling walker (2 wheeled) Gait Pattern/deviations: Step-to pattern;Decreased step length - left;Decreased stance time - right;Antalgic;Trunk flexed;Shuffle Gait velocity: decreased   General Gait Details: Slow cadence with effortful step-to pattern with cadence and RLE stance time slowly progressing during the session.   Stairs             Wheelchair Mobility    Modified Rankin (Stroke Patients Only)       Balance Overall balance assessment: Needs assistance Sitting-balance support: No upper extremity supported;Feet unsupported Sitting balance-Leahy Scale: Good     Standing balance support: Bilateral upper extremity supported;During functional activity Standing balance-Leahy Scale: Fair Standing balance comment: Pt requires BUE support on RW during ambulation and has a difficult time shifting weight onto the RLE  Cognition Arousal/Alertness: Awake/alert Behavior During Therapy: WFL for tasks assessed/performed Overall Cognitive Status: Within Functional Limits  for tasks assessed                                 General Comments: Pt HOH. Follows multistep commands well      Exercises Total Joint Exercises Ankle Circles/Pumps: AROM;Strengthening;Both;10 reps Quad Sets: Strengthening;Both;15 reps;10 reps Gluteal Sets: Strengthening;Both;15 reps Straight Leg Raises: AAROM;Right;5 reps Long Arc Quad: Strengthening;Both;5 reps Knee Flexion: Strengthening;Both;5 reps Marching in Standing: AROM;Strengthening;Both;Standing; 5 reps Other Exercises Other Exercises: VC's provided for safe positioning and hand/foot placement on RW    General Comments General comments (skin integrity, edema, etc.): Pt on room air, HR and SpO2 monitored throughout. Sitting in chair with legs elevated: SpO2 84-88%; SpO2 increased to high 80s-low 90s with seated exercises and ambulation. HR WFL throughout.      Pertinent Vitals/Pain Pain Assessment: 0-10 Pain Score: 0-No pain (pt states no pain sitting in chair, once standing pt states 8/10 pain) Pain Location: R hip Pain Descriptors / Indicators: Sore;Aching;Grimacing;Moaning;Guarding;Cramping Pain Intervention(s): Limited activity within patient's tolerance;Monitored during session;Premedicated before session;Repositioned    Home Living                      Prior Function            PT Goals (current goals can now be found in the care plan section) Acute Rehab PT Goals Patient Stated Goal: To be able to walk her dog again Progress towards PT goals: Not progressing toward goals - comment (Pt having increased difficulty with mobility and ambulation, likely secondary to pain. Pt requires significntly increased time and effort to ambulate ~61ft w/in room and is having a harder time bearing weight through the RLE.)    Frequency    BID      PT Plan Discharge plan needs to be updated    Co-evaluation              AM-PAC PT "6 Clicks" Mobility   Outcome Measure  Help needed  turning from your back to your side while in a flat bed without using bedrails?: A Little Help needed moving from lying on your back to sitting on the side of a flat bed without using bedrails?: A Little Help needed moving to and from a bed to a chair (including a wheelchair)?: A Little Help needed standing up from a chair using your arms (e.g., wheelchair or bedside chair)?: A Little Help needed to walk in hospital room?: A Little Help needed climbing 3-5 steps with a railing? : A Lot 6 Click Score: 17    End of Session Equipment Utilized During Treatment: Gait belt Activity Tolerance: Patient limited by pain Patient left: in chair;with call bell/phone within reach;with chair alarm set;with SCD's reapplied Nurse Communication: Mobility status PT Visit Diagnosis: Other abnormalities of gait and mobility (R26.89);Muscle weakness (generalized) (M62.81);Pain Pain - Right/Left: Right Pain - part of body: Hip     Time: 6712-4580 PT Time Calculation (min) (ACUTE ONLY): 24 min  Charges:                        Herbert Moors SPT 07/11/20, 1:44 PM

## 2020-07-11 NOTE — Progress Notes (Signed)
Personally spoke to Dr. Rudene Christians about the pt's BP of 94/16mmHg, asymptomatic. MD ordered Bolus of 500NS. To recheck BP after.

## 2020-07-11 NOTE — Progress Notes (Signed)
   Subjective: 2 Days Post-Op Procedure(s) (LRB): RIGHT TOTAL HIP ARTHROPLASTY ANTERIOR APPROACH (Right) Patient reports pain as moderate.   Patient is well, and has had no acute complaints or problems Denies any CP, SOB, ABD pain. We will continue therapy today.  Plan is to go Home after hospital stay.  Objective: Vital signs in last 24 hours: Temp:  [98.2 F (36.8 C)-99.3 F (37.4 C)] 99.3 F (37.4 C) (07/15 0726) Pulse Rate:  [67-87] 78 (07/15 0726) Resp:  [15-16] 16 (07/15 0726) BP: (101-114)/(57-61) 102/57 (07/15 0726) SpO2:  [91 %-93 %] 91 % (07/15 0726)  Intake/Output from previous day: 07/14 0701 - 07/15 0700 In: 720 [P.O.:720] Out: 0  Intake/Output this shift: No intake/output data recorded.  Recent Labs    07/10/20 0410 07/11/20 0322  HGB 11.6* 11.7*   Recent Labs    07/10/20 0410 07/11/20 0322  WBC 10.5 11.1*  RBC 3.66* 3.63*  HCT 33.3* 32.9*  PLT 206 178   Recent Labs    07/09/20 1832  NA 142  K 3.4*  CL 107  CO2 27  BUN 9  CREATININE 0.72  GLUCOSE 139*  CALCIUM 8.5*   No results for input(s): LABPT, INR in the last 72 hours.  EXAM General - Patient is Alert, Appropriate and Oriented Extremity - Neurovascular intact Sensation intact distally Intact pulses distally Dorsiflexion/Plantar flexion intact No cellulitis present Compartment soft Dressing - dressing C/D/I and no drainage Motor Function - intact, moving foot and toes well on exam.   Past Medical History:  Diagnosis Date  . Anxiety   . Arthritis   . COPD (chronic obstructive pulmonary disease) ([place])   . GERD (gastroesophageal reflux disease)   . History of 2019 novel coronavirus disease (COVID-19) 01/23/2020  . Hypertension     Assessment/Plan:   2 Days Post-Op Procedure(s) (LRB): RIGHT TOTAL HIP ARTHROPLASTY ANTERIOR APPROACH (Right) Active Problems:   S/P hip replacement  Estimated body mass index is 28.98 kg/m as calculated from the following:   Height as of  06/28/20: 5\' 7"  (1.702 m).   Weight as of 06/28/20: 83.9 kg. Advance diet Up with therapy  Needs BM Labs and VSS, hold Avapro and HCTZ CM to assist with discharge to home with HHPT tomorrow  DVT Prophylaxis - TED hose and SCDs, Eliquis Weight-Bearing as tolerated to right leg   T. Rachelle Hora, PA-C [place] 07/11/2020, 8:41 AM

## 2020-07-11 NOTE — Progress Notes (Signed)
Occupational Therapy Treatment Patient Details Name: Sabrina Hudson MRN: [id number] DOB: 09/22/1951 Today's Date: 07/11/2020    History of present illness Pt is a 69 yo female diagnosed with OA of the R hip and is s/p elective R THA.  PMH includes COPD, GERD, HTN, and A-fib.   OT comments  Pt presents semi-reclined in bed, requesting to go the bathroom. She completed bed mobility with MOD I, grimacing and reporting 7/10 pain. Required increased effort and VC's for hand/foot placement to complete STS transfer using RW with CGA. Ambulated to the toilet using RW with CGA and completed toileting hygiene INDEPENDENTLY. Improved STS transfer from elevated surface. Upon ambulation to recliner, pt required two rest breaks and reported becoming dizzy. Sat her down in recliner and checked BP, noted to be 99/54. RN notified and arrived. Pt fatigued at end of session, though with decreased dizziness and appropriately alert. Legs elevated and pt comfortable at end of session, SCDs reapplied and chair alarm on. Pt will continue to benefit from acute OT services to address ADL participation and safety. Discharge plan remains appropriate, pending pt pain management and 24/7 supervision.   Pt on room air, SpO2 >88% and HR 83-110 throughout.     Follow Up Recommendations  Home health OT;Supervision/Assistance - 24 hour (pending pt pain management)    Equipment Recommendations  None recommended by OT    Recommendations for Other Services      Precautions / Restrictions Precautions Precautions: Anterior Hip;Fall Precaution Booklet Issued: No Restrictions Weight Bearing Restrictions: Yes RLE Weight Bearing: Weight bearing as tolerated Other Position/Activity Restrictions: Pt presents this date with dizziness and continued SOB- monitor BP and SpO2       Mobility Bed Mobility Overal bed mobility: Modified Independent             General bed mobility comments: Extra time and effort, grimacing  and moaning in pain throughout  Transfers Overall transfer level: Needs assistance Equipment used: Rolling walker (2 wheeled) Transfers: Sit to/from Stand Sit to Stand: Min guard         General transfer comment: VC's for hand and foot placement, initial posterior pull on RW, corrected with VC's; noted pt improved STS t/f from elevated BSC    Balance Overall balance assessment: Needs assistance Sitting-balance support: No upper extremity supported;Feet unsupported Sitting balance-Leahy Scale: Good     Standing balance support: Bilateral upper extremity supported;During functional activity Standing balance-Leahy Scale: Fair Standing balance comment: Pt requires at least one UE on RW for support during ambulation                           ADL either performed or assessed with clinical judgement   ADL Overall ADL's : Needs assistance/impaired                         Toilet Transfer: Min guard;Cueing for safety;Cueing for sequencing;BSC;Regular Toilet;Ambulation;RW (BSC over toilet) Toilet Transfer Details (indicate cue type and reason): pt grimacing in pain during ambulation, reporting 9/10 pain. RN notified and arrived. Toileting- Clothing Manipulation and Hygiene: Sitting/lateral lean;Independent       Functional mobility during ADLs: Min guard;Rolling walker;Cueing for sequencing;Cueing for safety General ADL Comments: Pt with continued pain in L hip, limiting ADL participation. She became dizzy during ambulation to recliner following toilet t/f. BP 99/54, SpO2 90%, HR 95-101. Increased effort for STS transfers using RW. Continues to require MIN-MOD A for LB  ADL.     Vision Baseline Vision/History: Wears glasses Wears Glasses: At all times     Perception     Praxis      Cognition Arousal/Alertness: Awake/alert Behavior During Therapy: [facility] for tasks assessed/performed Overall Cognitive Status: Within Functional Limits for tasks assessed                                  General Comments: Pt alert and oriented, fatigued following mobility. HOH. Follows multistep commands well.        Exercises Other Exercises Other Exercises: VC's provided for safe positioning and hand/foot placement on RW Other Exercises: Facilitated STS transfers, toileting, pursed lip breathing   Shoulder Instructions       General Comments Pt on room air, monitored vitals throughout. Sitting EOB: SpO2 87-88%, HR 83. Following toilet t/f: SpO2 89%, HR 95-101. Pt reported dizziness while ambulating to recliner. BP: 99/54, SpO2 91%, HR 80s. RN notified and arrived. Pt legs elevated.    Pertinent Vitals/ Pain       Pain Assessment: 0-10 Pain Score: 9  (7/10 at start, 9/10 following mobility) Pain Location: R hip Pain Descriptors / Indicators: Sore;Aching;Grimacing;Moaning Pain Intervention(s): Limited activity within patient's tolerance;Monitored during session;Repositioned;Utilized relaxation techniques;Relaxation;Patient requesting pain meds-RN notified (MD is holding pain meds due to BP and HR, per RN)  Home Living                                          Prior Functioning/Environment              Frequency  Min 2X/week        Progress Toward Goals  OT Goals(current goals can now be found in the care plan section)  Progress towards OT goals: Progressing toward goals  Acute Rehab OT Goals Patient Stated Goal: To be able to walk her dog again OT Goal Formulation: With patient Time For Goal Achievement: 07/24/20 Potential to Achieve Goals: Good  Plan Discharge plan remains appropriate;Frequency remains appropriate    Co-evaluation                 AM-PAC OT "6 Clicks" Daily Activity     Outcome Measure   Help from another person eating meals?: None Help from another person taking care of personal grooming?: None Help from another person toileting, which includes using toliet, bedpan, or urinal?:  A Little Help from another person bathing (including washing, rinsing, drying)?: A Lot Help from another person to put on and taking off regular upper body clothing?: None Help from another person to put on and taking off regular lower body clothing?: A Lot 6 Click Score: 19    End of Session Equipment Utilized During Treatment: Gait belt;Rolling walker  OT Visit Diagnosis: Other abnormalities of gait and mobility (R26.89);Muscle weakness (generalized) (M62.81);Pain Pain - Right/Left: Right Pain - part of body: Hip   Activity Tolerance Patient tolerated treatment well;Patient limited by pain   Patient Left in chair;with call bell/phone within reach;with SCD's reapplied;with chair alarm set   Nurse Communication Other (comment) (Pt feeling dizzy, pain)        Time: 1962-2297 OT Time Calculation (min): 29 min  Charges: OT General Charges $OT Visit: 1 Visit OT Treatments $Self Care/Home Management : 23-37 mins  Jerilynn Birkenhead, OTS 07/11/20, 11:00 AM

## 2020-07-11 NOTE — Progress Notes (Signed)
Patient is concerned about the numbness she is still having in her R leg. She states it is numb on the side/top of her thigh below the [facility] dressing.

## 2020-07-11 NOTE — Progress Notes (Signed)
Physical Therapy Treatment Patient Details Name: Sabrina Hudson MRN: [id number] DOB: [birth year] Today's Date: 07/11/2020    History of Present Illness Pt is a 69 yo female diagnosed with OA of the R hip and is s/p elective R THA.  PMH includes COPD, GERD, HTN, and A-fib.    PT Comments    Pt pleasant and motivated to participate during the session. Pt reports 2/10 pain at the beginning of session sitting upright in chair with legs extended. Pt continues to demonstrate excellent carryover with seated exercises. SpO2 was measured in sitting and hovered between 84-86% for a significant portion of the beginning of the session with patient remaining asymptomatic. Pt performed multiple sets of LAQ on BLE with deep breathing and SpO2 increased to high 80s-low 90s for the remainder of the session. Nursing alerted and pt remained on room air. Pt able to come to standing with min guard. Pt continued to have significant difficulty with standing marches, most notably with lifting LLE. Pt had trouble bearing weight through RLE and R knee appeared to buckle slightly with attempted weight shifts. Pt also had continued trouble with ambulation due to inability to bear weight through RLE and shuffling of the LLE. Pt has significantly increased pain and difficulty with standing exercises and ambulation. Pt will benefit from PT services in a SNF setting upon discharge to safely address deficits listed in patient problem list for decreased caregiver assistance and eventual return to PLOF.    Follow Up Recommendations  SNF;Supervision for mobility/OOB     Equipment Recommendations  3in1 (PT)    Recommendations for Other Services       Precautions / Restrictions Precautions Precautions: Fall;Anterior Hip Precaution Booklet Issued: Yes (comment) Restrictions Weight Bearing Restrictions: Yes RLE Weight Bearing: Weight bearing as tolerated Other Position/Activity Restrictions: monitor SpO2    Mobility   Bed Mobility               General bed mobility comments: not assessed with PT today  Transfers Overall transfer level: Needs assistance Equipment used: Rolling walker (2 wheeled) Transfers: Sit to/from Stand Sit to Stand: Min guard         General transfer comment: min verbal cues for foot and hand placement w/ min guard.  Ambulation/Gait Ambulation/Gait assistance: Min guard Gait Distance (Feet): 15 Feet Assistive device: Rolling walker (2 wheeled) Gait Pattern/deviations: Step-to pattern;Decreased step length - left;Decreased stance time - right;Antalgic;Trunk flexed;Shuffle Gait velocity: decreased   General Gait Details: Slow cadence with effortful step-to pattern with cadence and significantly decreased stance time on RLE with shuffling LLE   Stairs             Wheelchair Mobility    Modified Rankin (Stroke Patients Only)       Balance Overall balance assessment: Needs assistance Sitting-balance support: No upper extremity supported;Feet unsupported Sitting balance-Leahy Scale: Good     Standing balance support: Bilateral upper extremity supported;During functional activity Standing balance-Leahy Scale: Fair Standing balance comment: Pt requires BUE support on RW during ambulation and has a difficult time shifting weight onto the RLE. RLE appeared to almost buckle with weight shifts                            Cognition Arousal/Alertness: Awake/alert Behavior During Therapy: WFL for tasks assessed/performed Overall Cognitive Status: Within Functional Limits for tasks assessed  General Comments: Pt HOH. Follows multistep commands well      Exercises Total Joint Exercises Ankle Circles/Pumps: AROM;Strengthening;Both;10 reps Quad Sets: Strengthening;Both;15 reps;10 reps Gluteal Sets: Strengthening;Both;15 reps Hip ABduction/ADduction: AAROM;Right;10 reps Straight Leg Raises:  AAROM;Right;10 reps Long Arc Quad: Strengthening;Both;10 reps;15 reps Knee Flexion: Strengthening;Both;5 reps Marching in Standing: AROM;Strengthening;Both;Standing;5 reps (Significant difficulty lifting LLE and bearing weight through RLE, with RLE appearing to buckle with weight shift attempts)     General Comments General comments (skin integrity, edema, etc.): Pt on room air, HR and SpO2 monitored throughout. Sitting in chair: SpO2 84-86%. After a few minutes SpO2 inc to high 80s-low 90s. HR WFL throughout      Pertinent Vitals/Pain Pain Assessment: 0-10 Pain Score: 2  Pain Location: R hip Pain Descriptors / Indicators: Sore;Aching;Grimacing;Guarding Pain Intervention(s): Monitored during session;Repositioned;Premedicated before session    Home Living                      Prior Function            PT Goals (current goals can now be found in the care plan section) Progress towards PT goals: Not progressing toward goals - comment (Pt continuing to have increased difficulty in standing and with ambulation and has not progressed with ability to bear weight through RLE)    Frequency    BID      PT Plan Current plan remains appropriate    Co-evaluation              AM-PAC PT "6 Clicks" Mobility   Outcome Measure  Help needed turning from your back to your side while in a flat bed without using bedrails?: A Little Help needed moving from lying on your back to sitting on the side of a flat bed without using bedrails?: A Little Help needed moving to and from a bed to a chair (including a wheelchair)?: A Little Help needed standing up from a chair using your arms (e.g., wheelchair or bedside chair)?: A Little Help needed to walk in hospital room?: A Little Help needed climbing 3-5 steps with a railing? : A Lot 6 Click Score: 17    End of Session Equipment Utilized During Treatment: Gait belt Activity Tolerance: Patient tolerated treatment well Patient left:  in chair;with call bell/phone within reach;with chair alarm set;with SCD's reapplied Nurse Communication: Mobility status;Other (comment) (notified regarding pt's SpO2 status throughout session) PT Visit Diagnosis: Other abnormalities of gait and mobility (R26.89);Muscle weakness (generalized) (M62.81);Pain Pain - Right/Left: Right Pain - part of body: Hip     Time: 3888-2800 PT Time Calculation (min) (ACUTE ONLY): 28 min  Charges:  $Gait Training: 8-22 mins $Therapeutic Exercise: 8-22 mins                    Rene Sizelove SPT 07/11/20, 5:27 PM

## 2020-07-12 MED ORDER — DILTIAZEM HCL ER COATED BEADS 120 MG PO CP24
120.0000 mg | ORAL_CAPSULE | Freq: Every day | ORAL | Status: DC
  Administered 2020-07-13: 120 mg via ORAL
  Filled 2020-07-12 (×2): qty 1

## 2020-07-12 NOTE — Progress Notes (Signed)
Pt's O2 desat with ambulation during PT session. Pt's placed on 1L/O2.

## 2020-07-12 NOTE — Progress Notes (Signed)
Physical Therapy Treatment Patient Details Name: Sabrina Hudson MRN: [id number] DOB: [birth year] Today's Date: 07/12/2020    History of Present Illness Pt is a 69 yo female diagnosed with OA of the R hip and is s/p elective R THA.  PMH includes COPD, GERD, HTN, and A-fib.    PT Comments    Pt pleasant and motivated to participate during the session. Pt continued to be more engaged and alert this afternoon. Pt began tx session with seated exercises which she remembered and sequenced herself with no cueing. Pt SpO2 was measured after seated exercises and held steady around 86%. Nurse was notified and pt was started on 1L O2 for remainder of session. Pt continued with additional sitting exercises and SpO2 was noted to respond and increase to the low 90s. Pt then performed standing marches with improved ability to shift weight onto RLE and continued on to ambulate 63ft with SBA. SpO2 was noted to remain between 89-92%. After cessation of ambulation, SpO2 briefly dropped back down to 87% before returning back to the low 90s. Nurse was notified of pt response to O2 and pt was left sitting in chair with 1L O2. While pt continued to show improvements with ambulation, pt still demonstrated mobility deficits that would make it unsafe to return to prior living situation. Pt will benefit from PT services in a SNF setting upon discharge to safely address deficits listed in patient problem list for decreased caregiver assistance and eventual return to PLOF.    Follow Up Recommendations  SNF;Supervision for mobility/OOB     Equipment Recommendations  3in1 (PT)    Recommendations for Other Services       Precautions / Restrictions Precautions Precautions: Fall;Anterior Hip Precaution Booklet Issued: Yes (comment) Restrictions Weight Bearing Restrictions: Yes RLE Weight Bearing: Weight bearing as tolerated Other Position/Activity Restrictions: monitor SpO2    Mobility  Bed Mobility Overal bed  mobility: Modified Independent             General bed mobility comments: not assessed with PT today  Transfers Overall transfer level: Needs assistance Equipment used: Rolling walker (2 wheeled) Transfers: Sit to/from Stand Sit to Stand: Min guard         General transfer comment: min guard with no cues needed  Ambulation/Gait Ambulation/Gait assistance: Min guard Gait Distance (Feet): 50 Feet Assistive device: Rolling walker (2 wheeled) Gait Pattern/deviations: Step-to pattern;Decreased step length - left;Decreased stance time - right;Antalgic;Trunk flexed Gait velocity: decreased   General Gait Details: Slow cadence with effortful step-to pattern and decreased stance time on RLE secondary to difficulty bearing weight through RLE. Ambulation continues to improve as noted with faster cadence, increased BLE step lengths, and less shuffling. Pt appeared more confident with ambulation today.   Stairs             Wheelchair Mobility    Modified Rankin (Stroke Patients Only)       Balance Overall balance assessment: Needs assistance Sitting-balance support: No upper extremity supported;Feet unsupported Sitting balance-Leahy Scale: Good     Standing balance support: Bilateral upper extremity supported;During functional activity Standing balance-Leahy Scale: Good Standing balance comment: Pt required BUE support on RW during ambulation and had a difficult time shifting weight onto RLE. Ability to weight shift continues to improve each session                            Cognition Arousal/Alertness: Awake/alert Behavior During Therapy: [facility] for tasks assessed/performed  Overall Cognitive Status: Within Functional Limits for tasks assessed                                 General Comments: Pt HOH. Follows multistep commands well      Exercises Total Joint Exercises Ankle Circles/Pumps: AROM;Strengthening;Both;10 reps;15 reps Quad Sets:  Strengthening;Both;15 reps;10 reps Gluteal Sets: Strengthening;Both;15 reps Hip ABduction/ADduction: AAROM;Right;10 reps Long Arc Quad: Strengthening;10 reps;Right Knee Flexion: 5 reps Marching in Standing: AROM;Strengthening;Both;Standing;10 reps Other Exercises Other Exercises: STS training from various height surfaces    General Comments General comments (skin integrity, edema, etc.):      Pertinent Vitals/Pain Pain Assessment: 0-10 Pain Score: 5  Pain Location: R hip Pain Descriptors / Indicators: Sore;Aching;Guarding Pain Intervention(s): Monitored during session;Premedicated before session;Repositioned    Home Living                      Prior Function            PT Goals (current goals can now be found in the care plan section) Progress towards PT goals: Progressing toward goals    Frequency    BID      PT Plan Current plan remains appropriate    Co-evaluation              AM-PAC PT "6 Clicks" Mobility   Outcome Measure  Help needed turning from your back to your side while in a flat bed without using bedrails?: A Little Help needed moving from lying on your back to sitting on the side of a flat bed without using bedrails?: A Little Help needed moving to and from a bed to a chair (including a wheelchair)?: A Little Help needed standing up from a chair using your arms (e.g., wheelchair or bedside chair)?: A Little Help needed to walk in hospital room?: A Little Help needed climbing 3-5 steps with a railing? : A Lot 6 Click Score: 17    End of Session Equipment Utilized During Treatment: Gait belt;Oxygen Activity Tolerance: Patient tolerated treatment well Patient left: in chair;with call bell/phone within reach;with chair alarm set;with SCD's reapplied Nurse Communication: Mobility status;Other (comment) (Pt resting SpO2 this session 86%. Nurse notified and pt started on 1L O2 during ambulation with O2 sats responding. Nurse notified and pt  kept on 1L O2 in room) PT Visit Diagnosis: Other abnormalities of gait and mobility (R26.89);Muscle weakness (generalized) (M62.81);Pain Pain - Right/Left: Right Pain - part of body: Hip     Time: 8786-7672 PT Time Calculation (min) (ACUTE ONLY): 32 min  Charges:  $Gait Training: 8-22 mins $Therapeutic Exercise: 8-22 mins                    Tessi Eustache SPT 07/12/20, 3:20 PM

## 2020-07-12 NOTE — Progress Notes (Signed)
   Subjective: 3 Days Post-Op Procedure(s) (LRB): RIGHT TOTAL HIP ARTHROPLASTY ANTERIOR APPROACH (Right) Patient reports pain as mild.   Patient is well.  Has noted to have low blood pressure, asymptomatic. Denies any CP, SOB, ABD pain. We will continue therapy today.  Plan is to go Home after hospital stay.  Objective: Vital signs in last 24 hours: Temp:  [97.9 F (36.6 C)-99.3 F (37.4 C)] 99.3 F (37.4 C) (07/16 0738) Pulse Rate:  [73-84] 73 (07/16 0738) Resp:  [16-18] 16 (07/16 0738) BP: (94-106)/(50-63) 101/57 (07/16 0738) SpO2:  [85 %-93 %] 92 % (07/16 0745)  Intake/Output from previous day: 07/15 0701 - 07/16 0700 In: 680 [P.O.:680] Out: 0  Intake/Output this shift: No intake/output data recorded.  Recent Labs    07/10/20 0410 07/11/20 0322  HGB 11.6* 11.7*   Recent Labs    07/10/20 0410 07/11/20 0322  WBC 10.5 11.1*  RBC 3.66* 3.63*  HCT 33.3* 32.9*  PLT 206 178   Recent Labs    07/09/20 1832  NA 142  K 3.4*  CL 107  CO2 27  BUN 9  CREATININE 0.72  GLUCOSE 139*  CALCIUM 8.5*   No results for input(s): LABPT, INR in the last 72 hours.  EXAM General - Patient is Alert, Appropriate and Oriented Extremity - Neurovascular intact Sensation intact distally Intact pulses distally Dorsiflexion/Plantar flexion intact No cellulitis present Compartment soft Dressing - dressing C/D/I and no drainage, Praveena intact without drainage Motor Function - intact, moving foot and toes well on exam.   Past Medical History:  Diagnosis Date  . Anxiety   . Arthritis   . COPD (chronic obstructive pulmonary disease) ([place])   . GERD (gastroesophageal reflux disease)   . History of 2019 novel coronavirus disease (COVID-19) 01/23/2020  . Hypertension     Assessment/Plan:   3 Days Post-Op Procedure(s) (LRB): RIGHT TOTAL HIP ARTHROPLASTY ANTERIOR APPROACH (Right) Active Problems:   S/P hip replacement  Estimated body mass index is 28.98 kg/m as calculated  from the following:   Height as of 06/28/20: 5\' 7"  (1.702 m).   Weight as of 06/28/20: 83.9 kg. Advance diet Up with therapy  Needs BM Labs and VSS, blood pressures running slightly low, continue to hold Avapro and HCTZ. CM to assist with [facility] tomorrow  DVT Prophylaxis - TED hose and SCDs, Eliquis Weight-Bearing as tolerated to right leg   T. Rachelle Hora, PA-C [place] 07/12/2020, 8:13 AM

## 2020-07-12 NOTE — TOC Progression Note (Signed)
Transition of Care [facility]) - Progression Note    Patient Details  Name: Sabrina Hudson MRN: [id number] Date of Birth: [birth year]  Transition of Care [facility]) CM/SW Contact  Asheton Viramontes, Gardiner Rhyme, LCSW Phone Number: 07/12/2020, 10:52 AM  Clinical Narrative:  [facility] offered bed for pt and she has accepted. Have begun insurance auth for [place] ref number [id number]. Pt has been vaccinated and will not need COVID test prior to transfer. Await insurance auth. [facility] aware of the plan.    Expected Discharge Plan: [place] Services Barriers to Discharge: Other (comment) (Lip reads-difficult to communicate with offered sign she does not do this)  Expected Discharge Plan and Services Expected Discharge Plan: [place] In-house Referral: Clinical Social Work   Post Acute Care Choice: [place] arrangements for the past 2 months: [place]: PT [place]: [facility]) Date [place]: 07/10/20 Time [place]: 1040 Representative spoke with at [place]: teresa   Social Determinants of Health ([place]) Interventions    Readmission Risk Interventions No flowsheet data found.

## 2020-07-12 NOTE — TOC Progression Note (Signed)
Transition of Care [facility]) - Progression Note    Patient Details  Name: Sabrina Hudson MRN: [id number] Date of Birth: Jul 19, 1951  Transition of Care [facility]) CM/SW Contact  Adell Koval, Gardiner Rhyme, LCSW Phone Number: 07/12/2020, 3:43 PM  Clinical Narrative:  Pt has received insurance auth [id number] ref [id number] start 7/16-7/20. CM Monique will follow. Pt set to go to [facility] tomorrow. Pt and sister aware of plan.     Expected Discharge Plan: [place] Services Barriers to Discharge: Other (comment) (Lip reads-difficult to communicate with offered sign she does not do this)  Expected Discharge Plan and Services Expected Discharge Plan: [place] In-house Referral: Clinical Social Work   Post Acute Care Choice: [place] arrangements for the past 2 months: [place]: PT [place]: [facility] ([facility]) Date [place]: 07/10/20 Time [place]: 1040 Representative spoke with at [place]: teresa   Social Determinants of Health ([place]) Interventions    Readmission Risk Interventions No flowsheet data found.

## 2020-07-12 NOTE — Progress Notes (Signed)
Physical Therapy Treatment Patient Details Name: Sabrina Hudson MRN: [id number] DOB: 09/13/1951 Today's Date: 07/12/2020    History of Present Illness Pt is a 69 yo female diagnosed with OA of the R hip and is s/p elective R THA.  PMH includes COPD, GERD, HTN, and A-fib.    PT Comments    Pt pleasant and motivated to participate during the session. At beginning of session pt was seated at EOB. Pt immediately appeared to be more alert and perked up since yesterday and reported feeling much better with pain 6/10. Pt began session with LAQ in sitting and appeared to perform a longer arc with less pain. Pt stood up with min guard and once in standing performed standing marches with improved march height and a greater ability to shift weight onto RLE. Pt appeared more excited to walk today, and was able to ambulate ~89ft with improved cadence and BLE step length as well as decreased shuffling. Pt was quite fatigued at this point and asked to sit down. Pt was able to return to sitting with improved eccentric control. Once in sitting pt continued to perform seated exercises. SpO2 and HR were monitored throughout session with SpO2 remaining in the high 80s-low 90s throughout. SpO2 briefly dropped between 82-84% after cessation of ambulation, but quickly returned to 90%. Pt has made significant progress with standing and ambulation since yesterday's session, but continues to present with significant mobility deficits and would not be safe to return to previous living situation. Pt will benefit from PT services in a SNF setting upon discharge to safely address deficits listed in patient problem list for decreased caregiver assistance and eventual return to PLOF.     Follow Up Recommendations  SNF;Supervision for mobility/OOB     Equipment Recommendations  3in1 (PT)    Recommendations for Other Services       Precautions / Restrictions Precautions Precautions: Fall;Anterior Hip Precaution Booklet  Issued: Yes (comment) Restrictions Weight Bearing Restrictions: Yes RLE Weight Bearing: Weight bearing as tolerated Other Position/Activity Restrictions: monitor SpO2    Mobility  Bed Mobility               General bed mobility comments: not assessed with PT today  Transfers Overall transfer level: Needs assistance Equipment used: Rolling walker (2 wheeled) Transfers: Sit to/from Stand Sit to Stand: Min guard            Ambulation/Gait Ambulation/Gait assistance: Min guard Gait Distance (Feet): 30 Feet Assistive device: Rolling walker (2 wheeled) Gait Pattern/deviations: Step-to pattern;Decreased step length - left;Decreased stance time - right;Antalgic;Trunk flexed Gait velocity: decreased   General Gait Details: Slow cadence with effortful step-to pattern and decreased stance time on RLE secondary to difficulty bearing weight through RLE. Ambulation improved since yesterday noted with faster cadence, increased BLE step lengths, and less shuffling. Pt appeared more confident with ambulation today.   Stairs             Wheelchair Mobility    Modified Rankin (Stroke Patients Only)       Balance Overall balance assessment: Needs assistance Sitting-balance support: No upper extremity supported;Feet unsupported Sitting balance-Leahy Scale: Good     Standing balance support: Bilateral upper extremity supported;During functional activity Standing balance-Leahy Scale: Good Standing balance comment: Pt requires BUE support on RW during ambulation and has a diffuclt time shifting weight onto RLE. Pt appears to be progressing  Cognition Arousal/Alertness: Awake/alert Behavior During Therapy: WFL for tasks assessed/performed Overall Cognitive Status: Within Functional Limits for tasks assessed                                 General Comments: Pt HOH. Follows multistep commands well      Exercises Total  Joint Exercises Ankle Circles/Pumps: AROM;Strengthening;Both;10 reps Quad Sets: Strengthening;Both;15 reps;10 reps Gluteal Sets: Strengthening;Both;15 reps Long Arc Quad: Strengthening;Both;10 reps;15 reps Marching in Standing: AROM;Strengthening;Both;Standing;10 reps    General Comments General comments (skin integrity, edema, etc.): Pt on room air, HR and SpO2 monitored throughout session. Sitting in chair at baseline: SpO2 88-90%. SpO2 remained in high 80s-low 90s for remainder of session, briefly dropping 82-84% immediately following cessation of ambulation.      Pertinent Vitals/Pain Pain Assessment: 0-10 Pain Score: 6  Pain Location: R hip Pain Descriptors / Indicators: Sore;Aching;Guarding Pain Intervention(s): Monitored during session;Premedicated before session;Repositioned    Home Living                      Prior Function            PT Goals (current goals can now be found in the care plan section) Progress towards PT goals: Progressing toward goals    Frequency    BID      PT Plan Current plan remains appropriate    Co-evaluation              AM-PAC PT "6 Clicks" Mobility   Outcome Measure  Help needed turning from your back to your side while in a flat bed without using bedrails?: A Little Help needed moving from lying on your back to sitting on the side of a flat bed without using bedrails?: A Little Help needed moving to and from a bed to a chair (including a wheelchair)?: A Little Help needed standing up from a chair using your arms (e.g., wheelchair or bedside chair)?: A Little Help needed to walk in hospital room?: A Little Help needed climbing 3-5 steps with a railing? : A Lot 6 Click Score: 17    End of Session Equipment Utilized During Treatment: Gait belt Activity Tolerance: Patient tolerated treatment well Patient left: in chair;with call bell/phone within reach;with chair alarm set;with SCD's reapplied Nurse Communication:  Mobility status;Other (comment) (nurse notified regarding pt SpO2 stats during session (see impression)) PT Visit Diagnosis: Other abnormalities of gait and mobility (R26.89);Muscle weakness (generalized) (M62.81);Pain Pain - Right/Left: Right Pain - part of body: Hip     Time: 0940-1003 PT Time Calculation (min) (ACUTE ONLY): 23 min  Charges:                        Lavi Sheehan SPT 07/12/20, 1:12 PM

## 2020-07-12 NOTE — Care Management Important Message (Signed)
Important Message  Patient Details  Name: Sabrina Hudson MRN: [id number] Date of Birth: 04/08/1951   Medicare Important Message Given:  Yes     Juliann Pulse A [name] 07/12/2020, 11:08 AM

## 2020-07-13 MED ORDER — DOCUSATE SODIUM 100 MG PO CAPS: 100.0000 mg | ORAL_CAPSULE | Freq: Two times a day (BID) | ORAL | 0 refills | Status: DC

## 2020-07-13 MED ORDER — TRAMADOL HCL 50 MG PO TABS: 50.0000 mg | ORAL_TABLET | Freq: Four times a day (QID) | ORAL | 0 refills | Status: DC | PRN

## 2020-07-13 MED ORDER — METHOCARBAMOL 500 MG PO TABS: 500.0000 mg | ORAL_TABLET | Freq: Four times a day (QID) | ORAL | 0 refills | Status: DC | PRN

## 2020-07-13 NOTE — TOC Transition Note (Signed)
Transition of Care [facility]) - CM/SW Discharge Note   Patient Details  Name: Sabrina Hudson MRN: [id number] Date of Birth: 1951-02-22  Transition of Care [facility]) CM/SW Contact:  Boris Sharper, LCSW Phone Number: 07/13/2020, 10:24 AM   Clinical Narrative:    Pt medically stable for discharge per MD. Pt will be transported to [facility] via EMS. Pt going to room 808, call to report number is [phone]. CSW notified pt's sister of discharge. CSW will arrange EMS transport once nursing staff ready.    Final next level of care: Skilled Nursing Facility Barriers to Discharge: No Barriers Identified   Patient Goals and CMS Choice Patient states their goals for this hospitalization and ongoing recovery are:: Go home soon with my sister CMS Medicare.gov Compare Post Acute Care list provided to:: Patient Choice offered to / list presented to : Patient  Discharge Placement              Patient chooses bed at: [facility] [place] Patient to be transferred to facility by: EMS Name of family member notified: Neoma Laming Patient and family notified of of transfer: 07/13/20  Discharge Plan and Services In-house Referral: Clinical Social Work   Post Acute Care Choice: [place]: PT [place] Agency: [facility] (now [facility]) Date [place]: 07/10/20 Time [place]: 1040 Representative spoke with at [place]: teresa  Social Determinants of Health ([place]) Interventions     Readmission Risk Interventions No flowsheet data found.

## 2020-07-13 NOTE — Discharge Summary (Signed)
Physician Discharge Summary  Patient ID: Sabrina Hudson MRN: [id number] DOB/AGE: 01-13-1951 70 y.o.  Admit date: 07/09/2020 Discharge date: 07/13/2020  Admission Diagnoses:  S/P hip replacement [Z96.649]   Discharge Diagnoses: Patient Active Problem List   Diagnosis Date Noted  . S/P hip replacement 07/09/2020  . Smoking   . Atrial fibrillation with RVR ([place]) 05/21/2020  . COPD with acute exacerbation ([place]) 05/20/2020  . HTN (hypertension) 05/20/2020  . COPD exacerbation ([place]) 05/20/2020    Past Medical History:  Diagnosis Date  . Anxiety   . Arthritis   . COPD (chronic obstructive pulmonary disease) ([place])   . GERD (gastroesophageal reflux disease)   . History of 2019 novel coronavirus disease (COVID-19) 01/23/2020  . Hypertension      Transfusion: none   Consultants (if any):   Discharged Condition: Improved  Hospital Course: Sabrina Hudson is an 69 y.o. female who was admitted 07/09/2020 with a diagnosis of right hip osteoarthritis and went to the operating room on 07/09/2020 and underwent the above named procedures.    Surgeries: Procedure(s): RIGHT TOTAL HIP ARTHROPLASTY ANTERIOR APPROACH on 07/09/2020 Patient tolerated the surgery well. Taken to PACU where she was stabilized and then transferred to the orthopedic floor.  Started on Eliquis.  SCDs applied bilaterally at 80 mm. Heels elevated on bed with rolled towels. No evidence of DVT. Negative [place]. Physical therapy started on day #1 for gait training and transfer. OT started day #1 for ADL and assisted devices.  Patient's foley was d/c on day #1. Patient's IV was d/c on day #2.  On post op day #3 patient was stable and ready for discharge to rehab facility.  Insurance authorization was pending, she was stable and ready for discharge on postop day 4.   Implants: Medacta AMIS 3 standard stem,54 millimeter Mpact DM cup and liner with metal S 28 mm head  She was given perioperative antibiotics:   Anti-infectives (From admission, onward)   Start     Dose/Rate Route Frequency Ordered Stop   07/09/20 1800  ceFAZolin (ANCEF) IVPB 2g/100 mL premix        2 g 200 mL/hr over 30 Minutes Intravenous Every 6 hours 07/09/20 1300 07/10/20 0007   07/09/20 1045  ceFAZolin (ANCEF) IVPB 2g/100 mL premix        2 g 200 mL/hr over 30 Minutes Intravenous On call to O.R. 07/09/20 1038 07/09/20 1222    .  She was given sequential compression devices, early ambulation, and Eliquis, teds for DVT prophylaxis.  She benefited maximally from the hospital stay and there were no complications.    Recent vital signs:  Vitals:   07/13/20 0720 07/13/20 0733  BP: 113/65   Pulse: 75   Resp: 16   Temp: 98 F (36.7 C)   SpO2: 96% 95%    Recent laboratory studies:  Lab Results  Component Value Date   HGB 11.7 (L) 07/11/2020   HGB 11.6 (L) 07/10/2020   HGB 14.0 07/05/2020   Lab Results  Component Value Date   WBC 11.1 (H) 07/11/2020   PLT 178 07/11/2020   No results found for: INR Lab Results  Component Value Date   NA 142 07/09/2020   K 3.4 (L) 07/09/2020   CL 107 07/09/2020   CO2 27 07/09/2020   BUN 9 07/09/2020   CREATININE 0.72 07/09/2020   GLUCOSE 139 (H) 07/09/2020    Discharge Medications:   Allergies as of 07/13/2020   No Known Allergies  Medication List    STOP taking these medications   HYDROcodone-acetaminophen 5-325 MG tablet Commonly known as: Norco     TAKE these medications   acetaminophen 500 MG tablet Commonly known as: TYLENOL Take 500 mg by mouth every 6 (six) hours as needed for moderate pain.   albuterol 108 (90 Base) MCG/ACT inhaler Commonly known as: VENTOLIN HFA Inhale 2 puffs into the lungs every 6 (six) hours as needed for wheezing or shortness of breath.   apixaban 5 MG Tabs tablet Commonly known as: ELIQUIS Take 1 tablet (5 mg total) by mouth 2 (two) times daily.   benzonatate 100 MG capsule Commonly known as: TESSALON Take 1 capsule (100  mg total) by mouth every 8 (eight) hours as needed for cough.   diltiazem 180 MG 24 hr capsule Commonly known as: CARDIZEM CD Take 180 mg by mouth daily.   diltiazem 120 MG 24 hr capsule Commonly known as: CARDIZEM CD Take 1 capsule (120 mg total) by mouth daily.   diltiazem 30 MG tablet Commonly known as: CARDIZEM Take 30 mg by mouth daily as needed (heart rate/pulse increase to greater than 100.).   docusate sodium 100 MG capsule Commonly known as: COLACE Take 1 capsule (100 mg total) by mouth 2 (two) times daily.   DULoxetine 60 MG capsule Commonly known as: CYMBALTA Take 60 mg by mouth daily.   fluticasone 50 MCG/ACT nasal spray Commonly known as: FLONASE Place 1 spray into both nostrils daily as needed for allergies or rhinitis.   ipratropium-albuterol 0.5-2.5 (3) MG/3ML Soln Commonly known as: DUONEB Take 3 mLs by nebulization every 6 (six) hours.   lidocaine 5 % Commonly known as: Lidoderm Place 1 patch onto the skin daily. Remove & Discard patch within 12 hours or as directed by MD   methocarbamol 500 MG tablet Commonly known as: ROBAXIN Take 1 tablet (500 mg total) by mouth every 6 (six) hours as needed for muscle spasms.   metoprolol tartrate 25 MG tablet Commonly known as: LOPRESSOR Take 25 mg by mouth 2 (two) times daily.   omeprazole 40 MG capsule Commonly known as: PRILOSEC Take 40 mg by mouth daily.   traMADol 50 MG tablet Commonly known as: ULTRAM Take 1 tablet (50 mg total) by mouth every 6 (six) hours as needed.   valsartan-hydrochlorothiazide 80-12.5 MG tablet Commonly known as: DIOVAN-HCT Take 1 tablet by mouth daily.            Durable Medical Equipment  (From admission, onward)         Start     Ordered   07/09/20 1300  DME Walker rolling  Once       Question Answer Comment  Walker: With 5 Inch Wheels   Patient needs a walker to treat with the following condition S/P hip replacement      07/09/20 1300   07/09/20 1300  DME 3  n 1  Once        07/09/20 1300   07/09/20 1300  DME Bedside commode  Once       Question:  Patient needs a bedside commode to treat with the following condition  Answer:  S/P hip replacement   07/09/20 1300          Diagnostic Studies: DG HIP OPERATIVE UNILAT W OR W/O PELVIS RIGHT  Result Date: 07/09/2020 CLINICAL DATA:  Status post right hip replacement. EXAM: OPERATIVE right HIP (WITH PELVIS IF PERFORMED) 2 VIEWS TECHNIQUE: Fluoroscopic spot image(s) were submitted for interpretation  post-operatively. Radiation exposure index: 4.9 mGy. COMPARISON:  February 05, 2020. FINDINGS: Two intraoperative fluoroscopic images were obtained of the right hip. These images demonstrate the right hip prosthesis to be in grossly good position. IMPRESSION: Status post right hip arthroplasty. Electronically Signed   By: Marijo Conception M.D.   On: 07/09/2020 13:01   DG HIP UNILAT W OR W/O PELVIS 2-3 VIEWS RIGHT  Result Date: 07/09/2020 CLINICAL DATA:  Status post total hip replacement EXAM: DG HIP  2-3V RIGHT COMPARISON:  February 05, 2020 FINDINGS: Frontal and lateral views were obtained. There is a total hip replacement on the right with prosthetic components well-seated. No acute fracture or dislocation. Postoperative air and drain noted on the right. Skin staples present. IMPRESSION: Status post total hip replacement right with prosthetic components well-seated. No fracture or dislocation. Electronically Signed   By: Lowella Grip III M.D.   On: 07/09/2020 13:17    Disposition:      Contact information for follow-up providers    Duanne Guess, PA-C Follow up in 2 week(s).   Specialties: Orthopedic Surgery, Emergency Medicine Contact information: [place] [address] [phone]            Contact information for after-discharge care    Destination    [facility] Preferred SNF .   Service: Skilled Nursing Contact information: [address] [place] [phone]                   Signed: Feliberto Gottron 07/13/2020, 8:31 AM

## 2020-07-13 NOTE — Plan of Care (Signed)

## 2020-07-13 NOTE — Discharge Instructions (Signed)

## 2020-07-13 NOTE — Progress Notes (Signed)
Patient discharged via ems with discharge instructions , and personal belongings, prevana applied to right hip

## 2020-07-13 NOTE — Progress Notes (Signed)
Report called to Charge Nurse at [facility], removed IV from LFa

## 2020-07-13 NOTE — Progress Notes (Signed)
   Subjective: 4 Days Post-Op Procedure(s) (LRB): RIGHT TOTAL HIP ARTHROPLASTY ANTERIOR APPROACH (Right) Patient reports pain as mild.   Patient is well.  Has noted to have low blood pressure, asymptomatic. Denies any CP, SOB, ABD pain. We will continue therapy today.  Going to rehab facility today.  Objective: Vital signs in last 24 hours: Temp:  [98 F (36.7 C)-98.5 F (36.9 C)] 98 F (36.7 C) (07/17 0720) Pulse Rate:  [67-86] 75 (07/17 0720) Resp:  [16] 16 (07/17 0720) BP: (102-117)/(57-65) 113/65 (07/17 0720) SpO2:  [90 %-96 %] 95 % (07/17 0733)  Intake/Output from previous day: 07/16 0701 - 07/17 0700 In: 1145 [P.O.:1145] Out: -  Intake/Output this shift: No intake/output data recorded.  Recent Labs    07/11/20 0322  HGB 11.7*   Recent Labs    07/11/20 0322  WBC 11.1*  RBC 3.63*  HCT 32.9*  PLT 178   No results for input(s): NA, K, CL, CO2, BUN, CREATININE, GLUCOSE, CALCIUM in the last 72 hours. No results for input(s): LABPT, INR in the last 72 hours.  EXAM General - Patient is Alert, Appropriate and Oriented Extremity - Neurovascular intact Sensation intact distally Intact pulses distally Dorsiflexion/Plantar flexion intact No cellulitis present Compartment soft Dressing - dressing C/D/I and no drainage, Praveena intact without drainage Motor Function - intact, moving foot and toes well on exam.   Past Medical History:  Diagnosis Date  . Anxiety   . Arthritis   . COPD (chronic obstructive pulmonary disease) ([place])   . GERD (gastroesophageal reflux disease)   . History of 2019 novel coronavirus disease (COVID-19) 01/23/2020  . Hypertension     Assessment/Plan:   4 Days Post-Op Procedure(s) (LRB): RIGHT TOTAL HIP ARTHROPLASTY ANTERIOR APPROACH (Right) Active Problems:   S/P hip replacement  Estimated body mass index is 28.98 kg/m as calculated from the following:   Height as of 06/28/20: 5\' 7"  (1.702 m).   Weight as of 06/28/20: 83.9  kg. Advance diet Up with therapy  Labs and vital signs are stable. CM to assist with discharge to [facility] today  DVT Prophylaxis - TED hose and SCDs, Eliquis Weight-Bearing as tolerated to right leg   T. Rachelle Hora, PA-C [place] 07/13/2020, 8:27 AM

## 2020-07-13 NOTE — Progress Notes (Signed)
Physical Therapy Treatment Patient Details Name: Sabrina Hudson MRN: [id number] DOB: [birth year] Today's Date: 07/13/2020    History of Present Illness Pt is a 69 yo female diagnosed with OA of the R hip and is s/p elective R THA.  PMH includes COPD, GERD, HTN, and A-fib.    PT Comments    Pt 91% on room air at rest.  She is able to stand and walk to bathroom with RW and min guard.  Sats checked on commode 82% with no recovery. O2 applied at 3 lpm and she increased to 96%  She was lowered back to 1 lpm per notes yesterday.  She is able to walk back to recliner and after short rest complete standing ex.  Sats remained stable at 1 lpm.  Pt left on 1 lpm and LPN notified of response to activity.  Gait generally steady with no LOB or buckling. Limited by fatigue and pain.   Follow Up Recommendations  SNF;Supervision for mobility/OOB     Equipment Recommendations  3in1 (PT)    Recommendations for Other Services       Precautions / Restrictions Precautions Precautions: Fall;Anterior Hip Restrictions Weight Bearing Restrictions: Yes RLE Weight Bearing: Weight bearing as tolerated Other Position/Activity Restrictions: monitor SpO2    Mobility  Bed Mobility               General bed mobility comments: in recliner before and after session  Transfers Overall transfer level: Needs assistance Equipment used: Rolling walker (2 wheeled) Transfers: Sit to/from Stand Sit to Stand: Min guard         General transfer comment: min guard with no cues needed  Ambulation/Gait Ambulation/Gait assistance: Min guard Gait Distance (Feet): 25 Feet Assistive device: Rolling walker (2 wheeled) Gait Pattern/deviations: Step-to pattern;Decreased step length - left;Decreased stance time - right;Antalgic;Trunk flexed Gait velocity: decreased   General Gait Details: to and from bathroom in room.  limtied by fatigue   Stairs             Wheelchair Mobility    Modified Rankin  (Stroke Patients Only)       Balance Overall balance assessment: Needs assistance Sitting-balance support: No upper extremity supported;Feet unsupported Sitting balance-Leahy Scale: Good     Standing balance support: Bilateral upper extremity supported;During functional activity Standing balance-Leahy Scale: Good Standing balance comment: Pt required BUE support on RW during ambulation and had a difficult time shifting weight onto RLE. Ability to weight shift continues to improve each session                            Cognition Arousal/Alertness: Awake/alert Behavior During Therapy: WFL for tasks assessed/performed Overall Cognitive Status: Within Functional Limits for tasks assessed                                 General Comments: Pt HOH. Follows multistep commands well      Exercises Other Exercises Other Exercises: standing SLR, marches and limited ab/add x 10    General Comments        Pertinent Vitals/Pain Pain Assessment: Faces Faces Pain Scale: Hurts a little bit Pain Location: R hip Pain Descriptors / Indicators: Sore;Aching;Guarding Pain Intervention(s): Limited activity within patient's tolerance;Monitored during session;Repositioned    Home Living  Prior Function            PT Goals (current goals can now be found in the care plan section) Progress towards PT goals: Progressing toward goals    Frequency    BID      PT Plan Current plan remains appropriate    Co-evaluation              AM-PAC PT "6 Clicks" Mobility   Outcome Measure  Help needed turning from your back to your side while in a flat bed without using bedrails?: A Little Help needed moving from lying on your back to sitting on the side of a flat bed without using bedrails?: A Little Help needed moving to and from a bed to a chair (including a wheelchair)?: A Little Help needed standing up from a chair using your arms  (e.g., wheelchair or bedside chair)?: A Little Help needed to walk in hospital room?: A Little Help needed climbing 3-5 steps with a railing? : A Lot 6 Click Score: 17    End of Session Equipment Utilized During Treatment: Gait belt;Oxygen Activity Tolerance: Patient tolerated treatment well;Patient limited by fatigue Patient left: in chair;with call bell/phone within reach;with chair alarm set;with SCD's reapplied Nurse Communication: Mobility status;Other (comment) Pain - Right/Left: Right Pain - part of body: Hip     Time: 3710-6269 PT Time Calculation (min) (ACUTE ONLY): 16 min  Charges:  $Gait Training: 8-22 mins                    Chesley Noon, PTA 07/13/20, 10:46 AM

## 2021-01-28 DIAGNOSIS — I779 Disorder of arteries and arterioles, unspecified: Secondary | ICD-10-CM | POA: Insufficient documentation

## 2021-02-13 ENCOUNTER — Other Ambulatory Visit: Payer: Self-pay | Admitting: Family Medicine

## 2021-02-13 DIAGNOSIS — Z1231 Encounter for screening mammogram for malignant neoplasm of breast: Secondary | ICD-10-CM

## 2021-03-19 ENCOUNTER — Ambulatory Visit
Admission: RE | Admit: 2021-03-19 | Discharge: 2021-03-19 | Disposition: A | Discharge status: Home / Self Care | Payer: Medicare Other | Source: Ambulatory Visit | Attending: Family Medicine | Admitting: Family Medicine

## 2021-03-19 ENCOUNTER — Other Ambulatory Visit: Payer: Self-pay

## 2021-03-19 DIAGNOSIS — Z1231 Encounter for screening mammogram for malignant neoplasm of breast: Secondary | ICD-10-CM | POA: Diagnosis present

## 2021-07-29 ENCOUNTER — Encounter: Payer: Self-pay | Admitting: Internal Medicine

## 2021-07-30 ENCOUNTER — Encounter: Payer: Self-pay | Admitting: Internal Medicine

## 2021-07-30 ENCOUNTER — Ambulatory Visit: Payer: Medicare Other | Admitting: Anesthesiology

## 2021-07-30 ENCOUNTER — Ambulatory Visit
Admission: RE | Admit: 2021-07-30 | Discharge: 2021-07-30 | Disposition: A | Discharge status: Home / Self Care | Payer: Medicare Other | Attending: Internal Medicine | Admitting: Internal Medicine

## 2021-07-30 ENCOUNTER — Encounter
Admission: RE | Disposition: A | Discharge status: Home / Self Care | Payer: Self-pay | Source: Home / Self Care | Attending: Internal Medicine

## 2021-07-30 DIAGNOSIS — K5730 Diverticulosis of large intestine without perforation or abscess without bleeding: Secondary | ICD-10-CM | POA: Insufficient documentation

## 2021-07-30 DIAGNOSIS — Z8616 Personal history of COVID-19: Secondary | ICD-10-CM | POA: Diagnosis not present

## 2021-07-30 DIAGNOSIS — Z7951 Long term (current) use of inhaled steroids: Secondary | ICD-10-CM | POA: Diagnosis not present

## 2021-07-30 DIAGNOSIS — Z79899 Other long term (current) drug therapy: Secondary | ICD-10-CM | POA: Insufficient documentation

## 2021-07-30 DIAGNOSIS — Z888 Allergy status to other drugs, medicaments and biological substances status: Secondary | ICD-10-CM | POA: Insufficient documentation

## 2021-07-30 DIAGNOSIS — Z87891 Personal history of nicotine dependence: Secondary | ICD-10-CM | POA: Insufficient documentation

## 2021-07-30 DIAGNOSIS — Z96641 Presence of right artificial hip joint: Secondary | ICD-10-CM | POA: Insufficient documentation

## 2021-07-30 DIAGNOSIS — K640 First degree hemorrhoids: Secondary | ICD-10-CM | POA: Diagnosis not present

## 2021-07-30 DIAGNOSIS — Z1211 Encounter for screening for malignant neoplasm of colon: Secondary | ICD-10-CM | POA: Insufficient documentation

## 2021-07-30 DIAGNOSIS — D128 Benign neoplasm of rectum: Secondary | ICD-10-CM | POA: Diagnosis not present

## 2021-07-30 HISTORY — DX: Headache, unspecified: R51.9

## 2021-07-30 HISTORY — DX: Unspecified hearing loss, bilateral: H91.93

## 2021-07-30 HISTORY — DX: Hyperlipidemia, unspecified: E78.5

## 2021-07-30 HISTORY — DX: Depression, unspecified: F32.A

## 2021-07-30 HISTORY — DX: Unspecified atrial fibrillation: I48.91

## 2021-07-30 HISTORY — PX: COLONOSCOPY WITH PROPOFOL: SHX5780

## 2021-07-30 SURGERY — COLONOSCOPY WITH PROPOFOL
Anesthesia: General

## 2021-07-30 MED ORDER — PROPOFOL 10 MG/ML IV BOLUS
INTRAVENOUS | Status: DC | PRN
  Administered 2021-07-30: 30 mg via INTRAVENOUS
  Administered 2021-07-30: 70 mg via INTRAVENOUS

## 2021-07-30 MED ORDER — PROPOFOL 500 MG/50ML IV EMUL
INTRAVENOUS | Status: DC | PRN
  Administered 2021-07-30: 175 ug/kg/min via INTRAVENOUS

## 2021-07-30 MED ORDER — SODIUM CHLORIDE 0.9 % IV SOLN
INTRAVENOUS | Status: DC
  Administered 2021-07-30: 1000 mL via INTRAVENOUS

## 2021-07-30 NOTE — Interval H&P Note (Signed)
History and Physical Interval Note:  07/30/2021 2:49 PM  Sabrina Hudson  has presented today for surgery, with the diagnosis of colon cancer screening.  The various methods of treatment have been discussed with the patient and family. After consideration of risks, benefits and other options for treatment, the patient has consented to  Procedure(s): COLONOSCOPY WITH PROPOFOL (N/A) as a surgical intervention.  The patient's history has been reviewed, patient examined, no change in status, stable for surgery.  I have reviewed the patient's chart and labs.  Questions were answered to the patient's satisfaction.     [place], [place]

## 2021-07-30 NOTE — Anesthesia Procedure Notes (Signed)
Procedure Name: General with mask airway Date/Time: 07/30/2021 2:56 PM Performed by: Kelton Pillar, CRNA Pre-anesthesia Checklist: Patient identified, Emergency Drugs available, Suction available and Patient being monitored Patient Re-evaluated:Patient Re-evaluated prior to induction Oxygen Delivery Method: Simple face mask Induction Type: IV induction Placement Confirmation: positive ETCO2 and CO2 detector Dental Injury: Teeth and Oropharynx as per pre-operative assessment

## 2021-07-30 NOTE — Anesthesia Postprocedure Evaluation (Signed)
Anesthesia Post Note  Patient: Sabrina Hudson  Procedure(s) Performed: COLONOSCOPY WITH PROPOFOL  Patient location during evaluation: Endoscopy Anesthesia Type: General Level of consciousness: awake and alert Pain management: pain level controlled Vital Signs Assessment: post-procedure vital signs reviewed and stable Respiratory status: spontaneous breathing, nonlabored ventilation, respiratory function stable and patient connected to nasal cannula oxygen Cardiovascular status: blood pressure returned to baseline and stable Postop Assessment: no apparent nausea or vomiting Anesthetic complications: no   No notable events documented.   Last Vitals:  Vitals:   07/30/21 1522 07/30/21 1532  BP: 118/61 (!) 158/79  Pulse: (!) 55 (!) 57  Resp: 16 14  Temp:    SpO2: 98% 99%    Last Pain:  Vitals:   07/30/21 1532  TempSrc:   PainSc: 0-No pain                 Martha Clan

## 2021-07-30 NOTE — Op Note (Signed)
[facility] Gastroenterology Patient Name: Khristi Wagley Procedure Date: 07/30/2021 2:51 PM MRN: [id number] Account #: [id number] Date of Birth: 06-Dec-1951 Admit Type: Outpatient Age: 70 Room: [facility] ENDO ROOM 4 Gender: Female Note Status: Finalized Procedure:             Colonoscopy Indications:           Screening for colorectal malignant neoplasm Providers:             Benay Pike. Alice Reichert MD, MD Referring MD:          Caprice Renshaw MD (Referring MD) Medicines:             Propofol per Anesthesia Complications:         No immediate complications. Procedure:             Pre-Anesthesia Assessment:                        - The risks and benefits of the procedure and the                         sedation options and risks were discussed with the                         patient. All questions were answered and informed                         consent was obtained.                        - Patient identification and proposed procedure were                         verified prior to the procedure by the nurse. The                         procedure was verified in the procedure room.                        - ASA Grade Assessment: III - A patient with severe                         systemic disease.                        - After reviewing the risks and benefits, the patient                         was deemed in satisfactory condition to undergo the                         procedure.                        After obtaining informed consent, the colonoscope was                         passed under direct vision. Throughout the procedure,                         the patient's blood pressure,  pulse, and oxygen                         saturations were monitored continuously. The                         Colonoscope was introduced through the anus and                         advanced to the the cecum, identified by appendiceal                         orifice and ileocecal  valve. The colonoscopy was                         performed without difficulty. The patient tolerated                         the procedure well. The quality of the bowel                         preparation was adequate. The ileocecal valve,                         appendiceal orifice, and rectum were photographed. Findings:      The perianal and digital rectal examinations were normal. Pertinent       negatives include normal sphincter tone and no palpable rectal lesions.      Non-bleeding internal hemorrhoids were found during retroflexion. The       hemorrhoids were Grade I (internal hemorrhoids that do not prolapse).      A 5 mm polyp was found in the rectum. The polyp was sessile. The polyp       was removed with a jumbo cold forceps. Resection and retrieval were       complete.      Many small and large-mouthed diverticula were found in the sigmoid colon.      The exam was otherwise without abnormality. Impression:            - Non-bleeding internal hemorrhoids.                        - One 5 mm polyp in the rectum, removed with a jumbo                         cold forceps. Resected and retrieved.                        - Diverticulosis in the sigmoid colon.                        - The examination was otherwise normal. Recommendation:        - Patient has a contact number available for                         emergencies. The signs and symptoms of potential                         delayed complications were discussed with the patient.  Return to normal activities tomorrow. Written                         discharge instructions were provided to the patient.                        - Resume previous diet.                        - Continue present medications.                        - Repeat colonoscopy is recommended for surveillance.                         The colonoscopy date will be determined after                         pathology results from today's  exam become available                         for review.                        - Return to GI office PRN.                        - The findings and recommendations were discussed with                         the patient.                        - Resume Eliquis (apixaban) at prior dose today. Refer                         to managing physician for further adjustment of                         therapy. Procedure Code(s):     --- Professional ---                        [phone], Colonoscopy, flexible; with biopsy, single or                         multiple Diagnosis Code(s):     --- Professional ---                        K57.30, Diverticulosis of large intestine without                         perforation or abscess without bleeding                        K64.0, First degree hemorrhoids                        K62.1, Rectal polyp                        Z12.11, Encounter for screening for malignant neoplasm  of colon CPT copyright 2019 [employer]. All rights reserved. The codes documented in this report are preliminary and upon coder review may  be revised to meet current compliance requirements. Efrain Sella MD, MD 07/30/2021 3:12:25 PM This report has been signed electronically. Number of Addenda: 0 Note Initiated On: 07/30/2021 2:51 PM Scope Withdrawal Time: 0 hours 7 minutes 19 seconds  Total Procedure Duration: 0 hours 11 minutes 8 seconds  Estimated Blood Loss:  Estimated blood loss: none.      [facility]

## 2021-07-30 NOTE — H&P (Signed)
Outpatient short stay form Pre-procedure 07/30/2021 2:49 PM Sabrina Hudson, M.D.  Primary Physician: Derinda Late, M.D.  Reason for visit:  Colon cancer screening  History of present illness:  Patient presents for colonoscopy for colon cancer screening. The patient denies complaints of abdominal pain, significant change in bowel habits, or rectal bleeding.      Current Facility-Administered Medications:    0.9 %  sodium chloride infusion, , Intravenous, Continuous, [place], Benay Pike, MD, Last Rate: 20 mL/hr at 07/30/21 1448, Continued from Pre-op at 07/30/21 1448  Medications Prior to Admission  Medication Sig Dispense Refill Last Dose   acetaminophen (TYLENOL) 500 MG tablet Take 500 mg by mouth every 6 (six) hours as needed for moderate pain.   Past Week   albuterol (VENTOLIN HFA) 108 (90 Base) MCG/ACT inhaler Inhale 2 puffs into the lungs every 6 (six) hours as needed for wheezing or shortness of breath. 8 g 0 Past Week   apixaban (ELIQUIS) 5 MG TABS tablet Take 1 tablet (5 mg total) by mouth 2 (two) times daily. 60 tablet 0 Past Week   diltiazem (CARDIZEM CD) 180 MG 24 hr capsule Take by mouth daily.   07/30/2021 at 0600am   DULoxetine (CYMBALTA) 60 MG capsule Take 60 mg by mouth daily.   07/29/2021   ezetimibe (ZETIA) 10 MG tablet Take 10 mg by mouth daily.      fluticasone (FLOVENT HFA) 110 MCG/ACT inhaler Inhale into the lungs 2 (two) times daily.      metoprolol tartrate (LOPRESSOR) 25 MG tablet Take 25 mg by mouth 2 (two) times daily.   07/30/2021 at 0600am   benzonatate (TESSALON) 100 MG capsule Take 1 capsule (100 mg total) by mouth every 8 (eight) hours as needed for cough. (Patient not taking: No sig reported) 20 capsule 0 Completed Course   diltiazem (CARDIZEM CD) 120 MG 24 hr capsule Take 1 capsule (120 mg total) by mouth daily. (Patient not taking: Reported on 06/28/2020) 30 capsule 0    diltiazem (CARDIZEM) 30 MG tablet Take 30 mg by mouth daily as needed (heart rate/pulse  increase to greater than 100.).       docusate sodium (COLACE) 100 MG capsule Take 1 capsule (100 mg total) by mouth 2 (two) times daily. 10 capsule 0    fluticasone (FLONASE) 50 MCG/ACT nasal spray Place 1 spray into both nostrils daily as needed for allergies or rhinitis.      ipratropium-albuterol (DUONEB) 0.5-2.5 (3) MG/3ML SOLN Take 3 mLs by nebulization every 6 (six) hours. 360 mL 0    lidocaine (LIDODERM) 5 % Place 1 patch onto the skin daily. Remove & Discard patch within 12 hours or as directed by MD (Patient not taking: No sig reported) 30 patch 0 Completed Course   methocarbamol (ROBAXIN) 500 MG tablet Take 1 tablet (500 mg total) by mouth every 6 (six) hours as needed for muscle spasms. 30 tablet 0    omeprazole (PRILOSEC) 40 MG capsule Take 40 mg by mouth daily.      traMADol (ULTRAM) 50 MG tablet Take 1 tablet (50 mg total) by mouth every 6 (six) hours as needed. 40 tablet 0    valsartan-hydrochlorothiazide (DIOVAN-HCT) 80-12.5 MG tablet Take 1 tablet by mouth daily.        Allergies  Allergen Reactions   Simvastatin Other (See Comments)    Muscle pain     Past Medical History:  Diagnosis Date   AF (atrial fibrillation) (HCC)    Anxiety  Arthritis    COPD (chronic obstructive pulmonary disease) (HCC)    Depression    GERD (gastroesophageal reflux disease)    Headache    migraine   Hearing impaired person, bilateral    History of 2019 novel coronavirus disease (COVID-19) 01/23/2020   HLD (hyperlipidemia)    Hypertension     Review of systems:  Otherwise negative.    Physical Exam  Gen: Alert, oriented. Appears stated age.  HEENT: [place]/AT. PERRLA. Lungs: CTA, no wheezes. CV: RR nl S1, S2. Abd: soft, benign, no masses. BS+ Ext: No edema. Pulses 2+    Planned procedures: Proceed with colonoscopy. The patient understands the nature of the planned procedure, indications, risks, alternatives and potential complications including but not limited to bleeding,  infection, perforation, damage to internal organs and possible oversedation/side effects from anesthesia. The patient agrees and gives consent to proceed.  Please refer to procedure notes for findings, recommendations and patient disposition/instructions.     Ivar Domangue K. Alice Hudson, M.D. Gastroenterology 07/30/2021  2:49 PM

## 2021-07-30 NOTE — Transfer of Care (Signed)
Immediate Anesthesia Transfer of Care Note  Patient: Sabrina Hudson  Procedure(s) Performed: COLONOSCOPY WITH PROPOFOL  Patient Location: Endoscopy Unit  Anesthesia Type:General  Level of Consciousness: drowsy and patient cooperative  Airway & Oxygen Therapy: Patient Spontanous Breathing and Patient connected to face mask oxygen  Post-op Assessment: Report given to RN and Post -op Vital signs reviewed and stable  Post vital signs: Reviewed and stable  Last Vitals:  Vitals Value Taken Time  BP 111/76 07/30/21 1512  Temp 36.1 C 07/30/21 1512  Pulse 57 07/30/21 1513  Resp 14 07/30/21 1513  SpO2 99 % 07/30/21 1513  Vitals shown include unvalidated device data.  Last Pain:  Vitals:   07/30/21 1512  TempSrc: Temporal  PainSc: Asleep      Patients Stated Pain Goal: 0 ([phone] [id number])  Complications: No notable events documented.

## 2021-07-30 NOTE — Anesthesia Preprocedure Evaluation (Signed)
Anesthesia Evaluation  Patient identified by MRN, date of birth, ID band Patient awake    Reviewed: Allergy & Precautions, H&P , NPO status , Patient's Chart, lab work & pertinent test results, reviewed documented beta blocker date and time   Airway Mallampati: II   Neck ROM: full    Dental  (+) Poor Dentition   Pulmonary COPD, former smoker,    Pulmonary exam normal        Cardiovascular Exercise Tolerance: Poor hypertension, On Medications negative cardio ROS Normal cardiovascular exam Rhythm:regular Rate:Normal     Neuro/Psych  Headaches, Anxiety Depression negative psych ROS   GI/Hepatic Neg liver ROS, GERD  Medicated,  Endo/Other  negative endocrine ROS  Renal/GU negative Renal ROS  negative genitourinary   Musculoskeletal   Abdominal   Peds  Hematology negative hematology ROS (+)   Anesthesia Other Findings Past Medical History: No date: Anxiety No date: Arthritis No date: COPD (chronic obstructive pulmonary disease) (HCC) No date: Depression No date: GERD (gastroesophageal reflux disease) No date: Headache     Comment:  migraine No date: Hearing impaired person, bilateral 01/23/2020: History of 2019 novel coronavirus disease (COVID-19) No date: HLD (hyperlipidemia) No date: Hypertension Past Surgical History: No date: ABDOMINAL HYSTERECTOMY No date: CESAREAN SECTION     Comment:  x 2 2011: COLONOSCOPY 2011: ESOPHAGOGASTRODUODENOSCOPY ENDOSCOPY No date: TONSILLECTOMY 07/09/2020: TOTAL HIP ARTHROPLASTY; Right     Comment:  Procedure: RIGHT TOTAL HIP ARTHROPLASTY ANTERIOR               APPROACH;  Surgeon: Hessie Knows, MD;  Location: [facility]               ORS;  Service: Orthopedics;  Laterality: Right; No date: TUBAL LIGATION   Reproductive/Obstetrics negative OB ROS                             Anesthesia Physical Anesthesia Plan  ASA: 3  Anesthesia Plan: General    Post-op Pain Management:    Induction:   PONV Risk Score and Plan:   Airway Management Planned:   Additional Equipment:   Intra-op Plan:   Post-operative Plan:   Informed Consent: I have reviewed the patients History and Physical, chart, labs and discussed the procedure including the risks, benefits and alternatives for the proposed anesthesia with the patient or authorized representative who has indicated his/her understanding and acceptance.     Dental Advisory Given  Plan Discussed with: CRNA  Anesthesia Plan Comments:         Anesthesia Quick Evaluation

## 2021-07-31 ENCOUNTER — Encounter: Payer: Self-pay | Admitting: Internal Medicine

## 2021-08-01 LAB — SURGICAL PATHOLOGY

## 2021-12-31 ENCOUNTER — Encounter: Payer: Self-pay | Admitting: Emergency Medicine

## 2021-12-31 ENCOUNTER — Emergency Department: Payer: Medicare Other

## 2021-12-31 ENCOUNTER — Inpatient Hospital Stay
Admission: EM | Admit: 2021-12-31 | Discharge: 2022-01-03 | DRG: 379 | Disposition: A | Discharge status: Home / Self Care | Payer: Medicare Other | Attending: Internal Medicine | Admitting: Internal Medicine

## 2021-12-31 ENCOUNTER — Other Ambulatory Visit: Payer: Self-pay

## 2021-12-31 DIAGNOSIS — F32A Depression, unspecified: Secondary | ICD-10-CM | POA: Diagnosis present

## 2021-12-31 DIAGNOSIS — K760 Fatty (change of) liver, not elsewhere classified: Secondary | ICD-10-CM | POA: Diagnosis present

## 2021-12-31 DIAGNOSIS — F419 Anxiety disorder, unspecified: Secondary | ICD-10-CM | POA: Diagnosis present

## 2021-12-31 DIAGNOSIS — H9193 Unspecified hearing loss, bilateral: Secondary | ICD-10-CM | POA: Diagnosis present

## 2021-12-31 DIAGNOSIS — Z7951 Long term (current) use of inhaled steroids: Secondary | ICD-10-CM

## 2021-12-31 DIAGNOSIS — Z90710 Acquired absence of both cervix and uterus: Secondary | ICD-10-CM

## 2021-12-31 DIAGNOSIS — Z79899 Other long term (current) drug therapy: Secondary | ICD-10-CM

## 2021-12-31 DIAGNOSIS — I10 Essential (primary) hypertension: Secondary | ICD-10-CM | POA: Diagnosis present

## 2021-12-31 DIAGNOSIS — K922 Gastrointestinal hemorrhage, unspecified: Secondary | ICD-10-CM | POA: Diagnosis not present

## 2021-12-31 DIAGNOSIS — K5733 Diverticulitis of large intestine without perforation or abscess with bleeding: Principal | ICD-10-CM | POA: Diagnosis present

## 2021-12-31 DIAGNOSIS — G43909 Migraine, unspecified, not intractable, without status migrainosus: Secondary | ICD-10-CM | POA: Diagnosis present

## 2021-12-31 DIAGNOSIS — E876 Hypokalemia: Secondary | ICD-10-CM | POA: Diagnosis present

## 2021-12-31 DIAGNOSIS — Z96641 Presence of right artificial hip joint: Secondary | ICD-10-CM | POA: Diagnosis present

## 2021-12-31 DIAGNOSIS — I4891 Unspecified atrial fibrillation: Secondary | ICD-10-CM | POA: Diagnosis not present

## 2021-12-31 DIAGNOSIS — R1032 Left lower quadrant pain: Secondary | ICD-10-CM | POA: Diagnosis present

## 2021-12-31 DIAGNOSIS — Z825 Family history of asthma and other chronic lower respiratory diseases: Secondary | ICD-10-CM | POA: Diagnosis not present

## 2021-12-31 DIAGNOSIS — Z8616 Personal history of COVID-19: Secondary | ICD-10-CM

## 2021-12-31 DIAGNOSIS — Z7982 Long term (current) use of aspirin: Secondary | ICD-10-CM

## 2021-12-31 DIAGNOSIS — K219 Gastro-esophageal reflux disease without esophagitis: Secondary | ICD-10-CM | POA: Diagnosis present

## 2021-12-31 DIAGNOSIS — M1990 Unspecified osteoarthritis, unspecified site: Secondary | ICD-10-CM | POA: Diagnosis present

## 2021-12-31 DIAGNOSIS — I480 Paroxysmal atrial fibrillation: Secondary | ICD-10-CM | POA: Diagnosis present

## 2021-12-31 DIAGNOSIS — Z888 Allergy status to other drugs, medicaments and biological substances status: Secondary | ICD-10-CM

## 2021-12-31 DIAGNOSIS — Z87891 Personal history of nicotine dependence: Secondary | ICD-10-CM

## 2021-12-31 DIAGNOSIS — K529 Noninfective gastroenteritis and colitis, unspecified: Secondary | ICD-10-CM | POA: Diagnosis present

## 2021-12-31 DIAGNOSIS — E785 Hyperlipidemia, unspecified: Secondary | ICD-10-CM | POA: Diagnosis present

## 2021-12-31 DIAGNOSIS — Z20822 Contact with and (suspected) exposure to covid-19: Secondary | ICD-10-CM | POA: Diagnosis present

## 2021-12-31 DIAGNOSIS — J449 Chronic obstructive pulmonary disease, unspecified: Secondary | ICD-10-CM | POA: Diagnosis present

## 2021-12-31 DIAGNOSIS — Z7901 Long term (current) use of anticoagulants: Secondary | ICD-10-CM | POA: Diagnosis not present

## 2021-12-31 DIAGNOSIS — R079 Chest pain, unspecified: Secondary | ICD-10-CM

## 2021-12-31 LAB — HEPATIC FUNCTION PANEL
ALT: 20 U/L (ref 0–44)
AST: 20 U/L (ref 15–41)
Albumin: 4.2 g/dL (ref 3.5–5.0)
Alkaline Phosphatase: 112 U/L (ref 38–126)
Bilirubin, Direct: 0.1 mg/dL (ref 0.0–0.2)
Indirect Bilirubin: 0.5 mg/dL (ref 0.3–0.9)
Total Bilirubin: 0.6 mg/dL (ref 0.3–1.2)
Total Protein: 7.1 g/dL (ref 6.5–8.1)

## 2021-12-31 LAB — LACTIC ACID, PLASMA
Lactic Acid, Venous: 1 mmol/L (ref 0.5–1.9)
Lactic Acid, Venous: 2.1 mmol/L (ref 0.5–1.9)

## 2021-12-31 LAB — CBC
HCT: 47.9 % — ABNORMAL HIGH (ref 36.0–46.0)
Hemoglobin: 16 g/dL — ABNORMAL HIGH (ref 12.0–15.0)
MCH: 31.3 pg (ref 26.0–34.0)
MCHC: 33.4 g/dL (ref 30.0–36.0)
MCV: 93.7 fL (ref 80.0–100.0)
Platelets: 242 10*3/uL (ref 150–400)
RBC: 5.11 MIL/uL (ref 3.87–5.11)
RDW: 13.2 % (ref 11.5–15.5)
WBC: 13.3 10*3/uL — ABNORMAL HIGH (ref 4.0–10.5)
nRBC: 0 % (ref 0.0–0.2)

## 2021-12-31 LAB — BASIC METABOLIC PANEL
Anion gap: 12 (ref 5–15)
BUN: 9 mg/dL (ref 8–23)
CO2: 24 mmol/L (ref 22–32)
Calcium: 8.8 mg/dL — ABNORMAL LOW (ref 8.9–10.3)
Chloride: 100 mmol/L (ref 98–111)
Creatinine, Ser: 0.57 mg/dL (ref 0.44–1.00)
GFR, Estimated: >60 mL/min (ref >60)
Glucose, Bld: 111 mg/dL — ABNORMAL HIGH (ref 70–99)
Potassium: 4.1 mmol/L (ref 3.5–5.1)
Sodium: 136 mmol/L (ref 135–145)

## 2021-12-31 LAB — TYPE AND SCREEN
ABO/RH(D): O POS
Antibody Screen: NEGATIVE

## 2021-12-31 LAB — TROPONIN I (HIGH SENSITIVITY)
Troponin I (High Sensitivity): 3 ng/L (ref <18)
Troponin I (High Sensitivity): 3 ng/L (ref <18)

## 2021-12-31 LAB — PROTIME-INR
INR: 0.9 (ref 0.8–1.2)
Prothrombin Time: 12 seconds (ref 11.4–15.2)

## 2021-12-31 LAB — MAGNESIUM: Magnesium: 2.2 mg/dL (ref 1.7–2.4)

## 2021-12-31 LAB — RESP PANEL BY RT-PCR (FLU A&B, COVID) ARPGX2
Influenza A by PCR: NEGATIVE
Influenza B by PCR: NEGATIVE
SARS Coronavirus 2 by RT PCR: NEGATIVE

## 2021-12-31 MED ORDER — METOPROLOL TARTRATE 5 MG/5ML IV SOLN: 5.0000 mg | Freq: Four times a day (QID) | INTRAVENOUS | Status: DC | PRN

## 2021-12-31 MED ORDER — METOPROLOL TARTRATE 25 MG PO TABS
25.0000 mg | ORAL_TABLET | Freq: Two times a day (BID) | ORAL | Status: DC
  Administered 2022-01-01 – 2022-01-03 (×5): 25 mg via ORAL
  Filled 2021-12-31 (×6): qty 1

## 2021-12-31 MED ORDER — TRAMADOL HCL 50 MG PO TABS: 50.0000 mg | ORAL_TABLET | Freq: Four times a day (QID) | ORAL | Status: DC | PRN

## 2021-12-31 MED ORDER — IOHEXOL 350 MG/ML SOLN
100.0000 mL | Freq: Once | INTRAVENOUS | Status: AC | PRN
  Administered 2021-12-31: 100 mL via INTRAVENOUS

## 2021-12-31 MED ORDER — EZETIMIBE 10 MG PO TABS
10.0000 mg | ORAL_TABLET | Freq: Every day | ORAL | Status: DC
  Administered 2022-01-01 – 2022-01-03 (×3): 10 mg via ORAL
  Filled 2021-12-31 (×3): qty 1

## 2021-12-31 MED ORDER — PANTOPRAZOLE SODIUM 40 MG IV SOLR
40.0000 mg | Freq: Two times a day (BID) | INTRAVENOUS | Status: DC
  Administered 2021-12-31 – 2022-01-03 (×6): 40 mg via INTRAVENOUS
  Filled 2021-12-31 (×6): qty 40

## 2021-12-31 MED ORDER — DILTIAZEM HCL ER COATED BEADS 180 MG PO CP24
180.0000 mg | ORAL_CAPSULE | Freq: Every day | ORAL | Status: DC
  Administered 2022-01-01 – 2022-01-03 (×3): 180 mg via ORAL
  Filled 2021-12-31 (×3): qty 1

## 2021-12-31 MED ORDER — IRBESARTAN 75 MG PO TABS
37.5000 mg | ORAL_TABLET | Freq: Once | ORAL | Status: AC
Start: 2021-12-31 — End: 2021-12-31
  Administered 2021-12-31: 37.5 mg via ORAL
  Filled 2021-12-31: qty 0.5

## 2021-12-31 MED ORDER — METHOCARBAMOL 500 MG PO TABS
500.0000 mg | ORAL_TABLET | Freq: Four times a day (QID) | ORAL | Status: DC | PRN
  Filled 2021-12-31: qty 1

## 2021-12-31 MED ORDER — PIPERACILLIN-TAZOBACTAM 3.375 G IVPB
3.3750 g | Freq: Three times a day (TID) | INTRAVENOUS | Status: DC
  Administered 2022-01-01: 3.375 g via INTRAVENOUS
  Filled 2021-12-31: qty 50

## 2021-12-31 MED ORDER — PROCHLORPERAZINE EDISYLATE 10 MG/2ML IJ SOLN
10.0000 mg | Freq: Four times a day (QID) | INTRAMUSCULAR | Status: DC | PRN
  Administered 2021-12-31: 10 mg via INTRAVENOUS
  Filled 2021-12-31: qty 2

## 2021-12-31 MED ORDER — BUDESONIDE 0.25 MG/2ML IN SUSP
0.2500 mg | Freq: Two times a day (BID) | RESPIRATORY_TRACT | Status: DC
  Administered 2022-01-01 – 2022-01-02 (×3): 0.25 mg via RESPIRATORY_TRACT
  Filled 2021-12-31 (×4): qty 2

## 2021-12-31 MED ORDER — ONDANSETRON HCL 4 MG PO TABS: 4.0000 mg | ORAL_TABLET | Freq: Four times a day (QID) | ORAL | Status: DC | PRN

## 2021-12-31 MED ORDER — LACTATED RINGERS IV SOLN: INTRAVENOUS | Status: DC

## 2021-12-31 MED ORDER — ONDANSETRON HCL 4 MG/2ML IJ SOLN
4.0000 mg | Freq: Four times a day (QID) | INTRAMUSCULAR | Status: DC | PRN
  Administered 2021-12-31: 4 mg via INTRAVENOUS
  Filled 2021-12-31: qty 2

## 2021-12-31 MED ORDER — DULOXETINE HCL 30 MG PO CPEP
60.0000 mg | ORAL_CAPSULE | Freq: Once | ORAL | Status: DC
  Filled 2021-12-31: qty 1

## 2021-12-31 MED ORDER — IRBESARTAN 75 MG PO TABS
37.5000 mg | ORAL_TABLET | Freq: Every day | ORAL | Status: DC
  Administered 2022-01-01 – 2022-01-03 (×3): 37.5 mg via ORAL
  Filled 2021-12-31 (×4): qty 0.5

## 2021-12-31 MED ORDER — METOPROLOL TARTRATE 25 MG PO TABS: 25.0000 mg | ORAL_TABLET | Freq: Two times a day (BID) | ORAL | Status: DC

## 2021-12-31 MED ORDER — MORPHINE SULFATE (PF) 4 MG/ML IV SOLN
4.0000 mg | Freq: Once | INTRAVENOUS | Status: AC
  Administered 2021-12-31: 4 mg via INTRAVENOUS
  Filled 2021-12-31: qty 1

## 2021-12-31 MED ORDER — DILTIAZEM HCL 25 MG/5ML IV SOLN
10.0000 mg | Freq: Once | INTRAVENOUS | Status: AC
  Administered 2021-12-31: 10 mg via INTRAVENOUS
  Filled 2021-12-31: qty 5

## 2021-12-31 MED ORDER — LACTATED RINGERS IV BOLUS
2500.0000 mL | Freq: Once | INTRAVENOUS | Status: AC
  Administered 2022-01-01: 2500 mL via INTRAVENOUS

## 2021-12-31 MED ORDER — ACETAMINOPHEN 325 MG PO TABS
650.0000 mg | ORAL_TABLET | Freq: Four times a day (QID) | ORAL | Status: DC | PRN
  Administered 2021-12-31: 650 mg via ORAL
  Filled 2021-12-31: qty 2

## 2021-12-31 MED ORDER — MORPHINE SULFATE (PF) 2 MG/ML IV SOLN: 2.0000 mg | INTRAVENOUS | Status: DC | PRN

## 2021-12-31 MED ORDER — ACETAMINOPHEN 650 MG RE SUPP: 650.0000 mg | Freq: Four times a day (QID) | RECTAL | Status: DC | PRN

## 2021-12-31 MED ORDER — LACTATED RINGERS IV BOLUS
1000.0000 mL | Freq: Once | INTRAVENOUS | Status: AC
  Administered 2021-12-31: 1000 mL via INTRAVENOUS

## 2021-12-31 MED ORDER — DULOXETINE HCL 30 MG PO CPEP
60.0000 mg | ORAL_CAPSULE | Freq: Every day | ORAL | Status: DC
  Administered 2022-01-01 – 2022-01-03 (×3): 60 mg via ORAL
  Filled 2021-12-31 (×2): qty 2
  Filled 2021-12-31: qty 1

## 2021-12-31 MED ORDER — BISACODYL 5 MG PO TBEC: 5.0000 mg | DELAYED_RELEASE_TABLET | Freq: Every day | ORAL | Status: DC | PRN

## 2021-12-31 MED ORDER — ONDANSETRON HCL 4 MG/2ML IJ SOLN
4.0000 mg | Freq: Once | INTRAMUSCULAR | Status: AC
  Administered 2021-12-31: 4 mg via INTRAVENOUS
  Filled 2021-12-31: qty 2

## 2021-12-31 MED ORDER — PIPERACILLIN-TAZOBACTAM 3.375 G IVPB
3.3750 g | Freq: Once | INTRAVENOUS | Status: AC
  Administered 2021-12-31: 3.375 g via INTRAVENOUS
  Filled 2021-12-31: qty 50

## 2021-12-31 MED ORDER — SENNOSIDES-DOCUSATE SODIUM 8.6-50 MG PO TABS: 1.0000 | ORAL_TABLET | Freq: Every evening | ORAL | Status: DC | PRN

## 2021-12-31 MED ORDER — IPRATROPIUM-ALBUTEROL 0.5-2.5 (3) MG/3ML IN SOLN
3.0000 mL | Freq: Once | RESPIRATORY_TRACT | Status: AC
  Administered 2021-12-31: 3 mL via RESPIRATORY_TRACT
  Filled 2021-12-31: qty 3

## 2021-12-31 MED ORDER — DILTIAZEM HCL ER COATED BEADS 180 MG PO CP24
180.0000 mg | ORAL_CAPSULE | Freq: Once | ORAL | Status: AC
  Administered 2021-12-31: 180 mg via ORAL
  Filled 2021-12-31: qty 1

## 2021-12-31 MED ORDER — FLUTICASONE PROPIONATE HFA 110 MCG/ACT IN AERO
1.0000 | INHALATION_SPRAY | Freq: Two times a day (BID) | RESPIRATORY_TRACT | Status: DC
  Filled 2021-12-31: qty 12

## 2021-12-31 NOTE — ED Notes (Signed)
Pt transported to CT ?

## 2021-12-31 NOTE — Consult Note (Signed)
PHARMACY -  BRIEF ANTIBIOTIC NOTE   Pharmacy has received consult(s) for intra-abdominal infection from an ED provider.  The patient's profile has been reviewed for ht/wt/allergies/indication/available labs.    One time order(s) placed for pip/tazo  Further antibiotics/pharmacy consults should be ordered by admitting physician if indicated.                       Thank you, Oswald Hillock 12/31/2021  3:28 PM

## 2021-12-31 NOTE — ED Provider Notes (Signed)
[facility] Provider Note    Event Date/Time   First MD Initiated Contact with Patient 12/31/21 1343     (approximate)   History   Chest Pain, Diarrhea, and Near Syncope   HPI  Sabrina Hudson is a 71 y.o. female  here with abdominal pain, bright red blood in stool.  Patient states that over the last 2 days or so, she has had cough, fatigue, and then severe, crampy, diffuse, abdominal pain.  Patient states that over the last day, she has had loose, watery stool and diarrhea that was initially nonbloody and now is grossly bloody.  She has been passing what she describes as a significant amount of blood and clots, bright red, from her rectum throughout the day.  She has associated urge to go to the bathroom even shortly after going.  She states she has had general fatigue.  Denies history of diverticulitis.  Denies known history of colon cancer.  She had a colonoscopy in August that she says was normal, though per review she did have diverticulosis as well as some polyps.  No history of inflammatory bowel disease.  She is not on blood thinners.  She describes the pain as aching, cramping, and severe.  No alleviating factors.     Physical Exam   Triage Vital Signs: ED Triage Vitals [12/31/21 1030]  Enc Vitals Group     BP (!) 158/86     Pulse Rate 88     Resp 17     Temp 98.1 F (36.7 C)     Temp Source Oral     SpO2 93 %     Weight 182 lb 1.6 oz (82.6 kg)     Height 5\' 6"  (1.676 m)     Head Circumference      Peak Flow      Pain Score 7     Pain Loc      Pain Edu?      Excl. in [place]?     Most recent vital signs: Vitals:   12/31/21 1341 12/31/21 1400  BP: (!) 169/86 (!) 177/92  Pulse: 86 80  Resp: 19 13  Temp:    SpO2: 96% 96%     General: Awake, no distress.  CV:  Good peripheral perfusion.  Resp:  Normal effort.  Mild expiratory wheezing diffusely.  Normal work of breathing.  Speaking in full sentences. Abd:  No distention.  Diffuse  tenderness, particularly in the left upper quadrant.  Slight guarding.  No rebound. Other:  No lower extremity swelling.  No edema.   ED Results / Procedures / Treatments   Labs (all labs ordered are listed, but only abnormal results are displayed) Labs Reviewed  BASIC METABOLIC PANEL - Abnormal; Notable for the following components:      Result Value   Glucose, Bld 111 (*)    Calcium 8.8 (*)    All other components within normal limits  CBC - Abnormal; Notable for the following components:   WBC 13.3 (*)    Hemoglobin 16.0 (*)    HCT 47.9 (*)    All other components within normal limits  RESP PANEL BY RT-PCR (FLU A&B, COVID) ARPGX2  GASTROINTESTINAL PANEL BY PCR, STOOL (REPLACES STOOL CULTURE)  CULTURE, BLOOD (ROUTINE X 2)  CULTURE, BLOOD (ROUTINE X 2)  PROTIME-INR  HEPATIC FUNCTION PANEL  LACTIC ACID, PLASMA  LACTIC ACID, PLASMA  MAGNESIUM  TYPE AND SCREEN  TROPONIN I (HIGH SENSITIVITY)  TROPONIN I (HIGH  SENSITIVITY)     EKG Normal sinus rhythm with PACs.  Ventricular rate 87.  PR 162, QRS 86, QTc 459.  No acute ST elevations or depressions.    RADIOLOGY CT abdomen/pelvis: Short segment circumferential wall thickening involving the splenic flexure of the colon, likely enteritis/colitis, no hemodynamically significant stenosis, images reviewed by me and I agree with radiology assessment. Chest x-ray: Reviewed by me, no acute disease.  Agree with radiology assessment.    PROCEDURES:  Critical Care performed: No  .1-3 Lead EKG Interpretation Performed by: Duffy Bruce, MD Authorized by: Duffy Bruce, MD     Interpretation: normal     ECG rate:  70-90   ECG rate assessment: normal     Rhythm: sinus rhythm     Ectopy: none     Conduction: normal   Comments:     Indication: weakness    MEDICATIONS ORDERED IN ED: Medications  piperacillin-tazobactam (ZOSYN) IVPB 3.375 g (has no administration in time range)  pantoprazole (PROTONIX) injection 40 mg  (has no administration in time range)  diltiazem (CARDIZEM CD) 24 hr capsule 180 mg (has no administration in time range)  DULoxetine (CYMBALTA) DR capsule 60 mg (has no administration in time range)  ezetimibe (ZETIA) tablet 10 mg (has no administration in time range)  fluticasone (FLOVENT HFA) 110 MCG/ACT inhaler 1 puff (has no administration in time range)  methocarbamol (ROBAXIN) tablet 500 mg (has no administration in time range)  metoprolol tartrate (LOPRESSOR) tablet 25 mg (has no administration in time range)  traMADol (ULTRAM) tablet 50 mg (has no administration in time range)  irbesartan (AVAPRO) tablet 37.5 mg (has no administration in time range)  acetaminophen (TYLENOL) tablet 650 mg (has no administration in time range)    Or  acetaminophen (TYLENOL) suppository 650 mg (has no administration in time range)  morphine 2 MG/ML injection 2 mg (has no administration in time range)  senna-docusate (Senokot-S) tablet 1 tablet (has no administration in time range)  bisacodyl (DULCOLAX) EC tablet 5 mg (has no administration in time range)  ondansetron (ZOFRAN) tablet 4 mg (has no administration in time range)    Or  ondansetron (ZOFRAN) injection 4 mg (has no administration in time range)  metoprolol tartrate (LOPRESSOR) injection 5 mg (has no administration in time range)  lactated ringers infusion (has no administration in time range)  lactated ringers bolus 1,000 mL (1,000 mLs Intravenous New Bag/Given 12/31/21 1431)  morphine 4 MG/ML injection 4 mg (4 mg Intravenous Given 12/31/21 1424)  ondansetron (ZOFRAN) injection 4 mg (4 mg Intravenous Given 12/31/21 1424)  ipratropium-albuterol (DUONEB) 0.5-2.5 (3) MG/3ML nebulizer solution 3 mL (3 mLs Nebulization Given 12/31/21 1455)  iohexol (OMNIPAQUE) 350 MG/ML injection 100 mL (100 mLs Intravenous Contrast Given 12/31/21 1430)     IMPRESSION / MDM / ASSESSMENT AND PLAN / ED COURSE  I reviewed the triage vital signs and the nursing notes.                                The patient is on the cardiac monitor to evaluate for evidence of arrhythmia and/or significant heart rate changes.   Ddx: Diverticular bleed, colitis, enteritis, mesenteric ischemia, viral GI illness, AVM, internal hemorrhoids  71 year old female with history of atrial fibrillation, prescribed Eliquis though she states Cardiology took her off of it, here with bright red blood per rectum.  Patient is hemodynamically stable with hemoglobin of 16.  White blood cell count  13.3, concerning for possible infection. LA normal which is reassuring. Pt unclear on whether she has been adherent with anticoagulation, and given her abd pain and bloody stools, CT Angio obtained for eval of mesenteric ischemia. I reviewed images, which show focal colitis/enteritis but no evidence of vascular compromise, fortunately. CXR is clear - suspect her wheezing is 2/2 her underlying COPD, possible viral syndrome causing her colitis.  Will admit for management of LGIB on anticoagulation likely from infectious enteritis/colitis. Pt updated and in agreement, with daughter at bedside as well.   MEDICATIONS GIVEN IN ED: Medications  piperacillin-tazobactam (ZOSYN) IVPB 3.375 g (has no administration in time range)  pantoprazole (PROTONIX) injection 40 mg (has no administration in time range)  diltiazem (CARDIZEM CD) 24 hr capsule 180 mg (has no administration in time range)  DULoxetine (CYMBALTA) DR capsule 60 mg (has no administration in time range)  ezetimibe (ZETIA) tablet 10 mg (has no administration in time range)  fluticasone (FLOVENT HFA) 110 MCG/ACT inhaler 1 puff (has no administration in time range)  methocarbamol (ROBAXIN) tablet 500 mg (has no administration in time range)  metoprolol tartrate (LOPRESSOR) tablet 25 mg (has no administration in time range)  traMADol (ULTRAM) tablet 50 mg (has no administration in time range)  irbesartan (AVAPRO) tablet 37.5 mg (has no administration in  time range)  acetaminophen (TYLENOL) tablet 650 mg (has no administration in time range)    Or  acetaminophen (TYLENOL) suppository 650 mg (has no administration in time range)  morphine 2 MG/ML injection 2 mg (has no administration in time range)  senna-docusate (Senokot-S) tablet 1 tablet (has no administration in time range)  bisacodyl (DULCOLAX) EC tablet 5 mg (has no administration in time range)  ondansetron (ZOFRAN) tablet 4 mg (has no administration in time range)    Or  ondansetron (ZOFRAN) injection 4 mg (has no administration in time range)  metoprolol tartrate (LOPRESSOR) injection 5 mg (has no administration in time range)  lactated ringers infusion (has no administration in time range)  lactated ringers bolus 1,000 mL (1,000 mLs Intravenous New Bag/Given 12/31/21 1431)  morphine 4 MG/ML injection 4 mg (4 mg Intravenous Given 12/31/21 1424)  ondansetron (ZOFRAN) injection 4 mg (4 mg Intravenous Given 12/31/21 1424)  ipratropium-albuterol (DUONEB) 0.5-2.5 (3) MG/3ML nebulizer solution 3 mL (3 mLs Nebulization Given 12/31/21 1455)  iohexol (OMNIPAQUE) 350 MG/ML injection 100 mL (100 mLs Intravenous Contrast Given 12/31/21 1430)     Consults: Discussed case with Dr. Dena Billet of Hospitalist Service for admission.   EMR reviewed Reviewed recent PCP notes from 12/15 which show pt has pAFib, though reports she was taken off eliquis by Cardiology. Reviewed colonoscopy report from Dr. Alice Reichert on 07/2021 showing diverticulosis + polyps.     FINAL CLINICAL IMPRESSION(S) / ED DIAGNOSES   Final diagnoses:  None     Rx / DC Orders   ED Discharge Orders     None        Note:  This document was prepared using Dragon voice recognition software and may include unintentional dictation errors.   Duffy Bruce, MD 12/31/21 [phone]

## 2021-12-31 NOTE — H&P (Signed)
History and Physical    Sabrina Hudson ZCH:[id number] DOB: [birth year] DOA: 12/31/2021  PCP: Derinda Late, MD  Patient coming from: Home  I have personally briefly reviewed patient's old medical records in [facility].  Chief Complaint: Abdominal pain, diarrhea, and blood per rectum  HPI: Sabrina Hudson is a 71 y.o. female with medical history significant for paroxysmal atrial fibrillation (in sinus rhythm since July so was taken off of Eliquis at that time and changed to aspirin 81 mg), COPD, GERD, arthritis, hard of hearing and reads lips, hypertension, hyperlipidemia, who presents to the emergency department on 12/31/2021 with abdominal pain, diarrhea, and blood per rectum. Patient's symptoms began 12/30/2021 and duration is intermittent but frequent.  Her abdominal pain is located in the lower abdomen bilaterally, radiates to the back, is up to 8/10 at times and is characterized as cramping. Associated symptoms: Severe watery diarrhea that began at the same time.  Severe nausea.  On the day of admission, she developed vomiting and began having bright red blood per rectum.  She reports the blood was quite a lot, at least a cup of blood.  She has severe fatigue and reports she feels awful.  On the day of admission she has also had some intermittent chest pain that pain is located in the substernal area, does not radiate, is moderate at times and is characterized as heaviness and aching. Symptoms are alleviated by nothing and exacerbated by nothing.  She has had intermittent palpitations, shortness of breath, lightheadedness.  She has had trouble keeping anything down and has had minimal oral intake.  Chills, and developed a fever a few days before the rest of her symptoms started.  No dysuria or hematuria.  She has a headache that is bifrontal. she reports she did not take her medications on the day of admission.  She has taken Tylenol for pain but denies taking NSAIDs other than her aspirin  81 mg.  ED Course: Vitals: Temperature 98.1 F, pulse 88, respiratory rate 17, BP 158/86, O2 sat 93%.  Labs include creatinine 0.57, potassium 4.1, glucose 111, troponin high-sensitivity 3, WBCs 13.3, hemoglobin 16, platelets 242, lactic acid 1, liver function test within normal limits, INR 0.9.  SARS-CoV-2 PCR and influenza testing are all negative.  Chest x-ray showed no acute process.  CTA of the abdomen and pelvis showed wall thickening in the splenic flexure likely localized infection; hepatic steatosis with mildly nodular liver, and atherosclerotic plaque throughout the abdominal aorta without stenosis.  Blood cultures were ordered.  Stool cultures including C. difficile were also ordered. Patient was started empirically on IV Zosyn for suspected intra-abdominal infection.     Review of Systems: As per HPI otherwise all other systems reviewed and are unremarable. NEUROLOGICAL: No focal weakness. Bifrontal headache. INTEGUMENT: no rashes, itching, or new lesions.    Past Medical History:  Diagnosis Date   AF (atrial fibrillation) (HCC)    Anxiety    Arthritis    COPD (chronic obstructive pulmonary disease) (HCC)    Depression    GERD (gastroesophageal reflux disease)    Headache    migraine   Hearing impaired person, bilateral    History of 2019 novel coronavirus disease (COVID-19) 01/23/2020   HLD (hyperlipidemia)    Hypertension     Past Surgical History:  Procedure Laterality Date   ABDOMINAL HYSTERECTOMY     CESAREAN SECTION     x 2   COLONOSCOPY  2011   COLONOSCOPY WITH PROPOFOL N/A 07/30/2021  Procedure: COLONOSCOPY WITH PROPOFOL;  Surgeon: Toledo, Benay Pike, MD;  Location: [facility] ENDOSCOPY;  Service: Gastroenterology;  Laterality: N/A;   ESOPHAGOGASTRODUODENOSCOPY ENDOSCOPY  2011   TONSILLECTOMY     TOTAL HIP ARTHROPLASTY Right 07/09/2020   Procedure: RIGHT TOTAL HIP ARTHROPLASTY ANTERIOR APPROACH;  Surgeon: Hessie Knows, MD;  Location: [facility] ORS;  Service: Orthopedics;   Laterality: Right;   TUBAL LIGATION      Social History  reports that she quit smoking about 19 months ago. Her smoking use included cigarettes. She has a 22.50 pack-year smoking history. She has never used smokeless tobacco. She reports that she does not currently use alcohol. She reports that she does not use drugs.  Allergies  Allergen Reactions   Simvastatin Other (See Comments)    Muscle pain    Family History  Problem Relation Age of Onset   COPD Mother    Parkinson's disease Father    Breast cancer Neg Hx      Home Medications  Prior to Admission medications   Medication Sig Start Date End Date Taking? Authorizing Provider  acetaminophen (TYLENOL) 500 MG tablet Take 500 mg by mouth every 6 (six) hours as needed for moderate pain.    [provider]  albuterol (VENTOLIN HFA) 108 (90 Base) MCG/ACT inhaler Inhale 2 puffs into the lungs every 6 (six) hours as needed for wheezing or shortness of breath. 05/23/20   Nolberto Hanlon, MD  apixaban (ELIQUIS) 5 MG TABS tablet Take 1 tablet (5 mg total) by mouth 2 (two) times daily. 05/23/20   Nolberto Hanlon, MD  benzonatate (TESSALON) 100 MG capsule Take 1 capsule (100 mg total) by mouth every 8 (eight) hours as needed for cough. Patient not taking: Reported on 06/28/2020 05/23/20   Nolberto Hanlon, MD  diltiazem (CARDIZEM CD) 120 MG 24 hr capsule Take 1 capsule (120 mg total) by mouth daily. Patient not taking: Reported on 06/28/2020 05/24/20   Nolberto Hanlon, MD  diltiazem (CARDIZEM CD) 180 MG 24 hr capsule Take by mouth daily.    [provider]  diltiazem (CARDIZEM) 30 MG tablet Take 30 mg by mouth daily as needed (heart rate/pulse increase to greater than 100.).     [provider]  docusate sodium (COLACE) 100 MG capsule Take 1 capsule (100 mg total) by mouth 2 (two) times daily. 07/13/20   Duanne Guess, PA-C  DULoxetine (CYMBALTA) 60 MG capsule Take 60 mg by mouth daily.    [provider]  ezetimibe  (ZETIA) 10 MG tablet Take 10 mg by mouth daily.    [provider]  fluticasone (FLONASE) 50 MCG/ACT nasal spray Place 1 spray into both nostrils daily as needed for allergies or rhinitis.    [provider]  fluticasone (FLOVENT HFA) 110 MCG/ACT inhaler Inhale into the lungs 2 (two) times daily.    [provider]  ipratropium-albuterol (DUONEB) 0.5-2.5 (3) MG/3ML SOLN Take 3 mLs by nebulization every 6 (six) hours. 05/23/20 06/22/20  Nolberto Hanlon, MD  lidocaine (LIDODERM) 5 % Place 1 patch onto the skin daily. Remove & Discard patch within 12 hours or as directed by MD Patient not taking: No sig reported 02/05/20   Laban Emperor, PA-C  methocarbamol (ROBAXIN) 500 MG tablet Take 1 tablet (500 mg total) by mouth every 6 (six) hours as needed for muscle spasms. 07/13/20   Duanne Guess, PA-C  metoprolol tartrate (LOPRESSOR) 25 MG tablet Take 25 mg by mouth 2 (two) times daily.    [provider]  omeprazole (PRILOSEC) 40 MG capsule Take 40 mg by mouth daily. 02/26/20   [provider]  traMADol (ULTRAM) 50 MG tablet Take 1 tablet (50 mg total) by mouth every 6 (six) hours as needed. 07/13/20   Duanne Guess, PA-C  valsartan-hydrochlorothiazide (DIOVAN-HCT) 80-12.5 MG tablet Take 1 tablet by mouth daily.    [provider]    Physical Exam: Vitals:   12/31/21 1400 12/31/21 1638 12/31/21 1700 12/31/21 1800  BP: (!) 177/92 139/81 112/78 (!) 159/89  Pulse: 80 86 90 92  Resp: 13 14 16 14   Temp:      TempSrc:      SpO2: 96% 92% 92% 93%  Weight:      Height:        Constitutional:  ill-appearing, well developed. Vitals:   12/31/21 1400 12/31/21 1638 12/31/21 1700 12/31/21 1800  BP: (!) 177/92 139/81 112/78 (!) 159/89  Pulse: 80 86 90 92  Resp: 13 14 16 14   Temp:      TempSrc:      SpO2: 96% 92% 92% 93%  Weight:      Height:       Eyes: Pupils equal and round, lids and conjunctivae without icterus or erythema. ENMT: Mucous membranes  are dry. Posterior pharynx clear of any exudate or lesions. Nares patent without discharge or bleeding.  Normocephalic, atraumatic.  Normal dentition.  Neck: normal, supple, no masses, trachea midline.  Thyroid nontender, no masses appreciated, no thyromegaly. Respiratory: clear to auscultation bilaterally. Chest wall movements are symmetric. No wheezing, no crackles.  No rhonchi.  Normal respiratory effort. No accessory muscle use. Occasional tachypnea. Cardiovascular: Normal S1, S2.  Irregular.  Markedly tachycardic.  No murmurs / rubs / gallops. Pulses: Radial, PT, and DP pulses 2+ bilaterally. No carotid bruits.  Capillary refill less than 3 seconds in all extremities. Edema: None bilaterally. GI: soft, non-distended, normal active bowel sounds. No hepatosplenomegaly. No rigidity, rebound, or guarding.  No CVA tenderness bilaterally. No masses palpated.  Patient vomiting intermittently.  Tenderness to palpation throughout the lower abdomen bilaterally and in the periumbilical area. Musculoskeletal: no clubbing / cyanosis. No joint deformity upper and lower extremities. Good ROM, no contractures. Normal muscle tone.  No tenderness or deformity in the back bilaterally. Integument: no rashes, lesions, ulcers. No induration. Clean, dry, intact.  Pale. Neurologic: CN 2-12 grossly intact. Sensation grossly intact to light touch.  Babinski: Toes downgoing bilaterally.  Strength 5/5 in all 4.  Intact rapid alternating movements bilaterally.  No pronator drift. Psychiatric: Normal judgment and insight. Alert and oriented x 3. Normal mood.  Normal and appropriate affect. Lymphatic: No cervical lymphadenopathy. No supraclavicular lymphadenopathy.   Labs on Admission: I have personally reviewed the following labs and imaging studies.  CBC: Recent Labs  Lab 12/31/21 1033  WBC 13.3*  HGB 16.0*  HCT 47.9*  MCV 93.7  PLT 833    Basic Metabolic Panel: Recent Labs  Lab 12/31/21 1033 12/31/21 1631  NA  136  --   K 4.1  --   CL 100  --   CO2 24  --   GLUCOSE 111*  --   BUN 9  --   CREATININE 0.57  --   CALCIUM 8.8*  --   MG  --  2.2    GFR: Estimated Creatinine Clearance: 72.9 mL/min (by C-G formula based on SCr of 0.57 mg/dL).  Liver Function Tests: Recent Labs  Lab 12/31/21 1428  AST 20  ALT 20  ALKPHOS 112  BILITOT 0.6  PROT 7.1  ALBUMIN 4.2    Radiological Exams on Admission:  Imaging personally viewed and interpreted.  Chest x-ray with no acute process.  DG Chest 2 View  Result Date: 12/31/2021 CLINICAL DATA:  Chest pain.  Near syncope. EXAM: CHEST - 2 VIEW COMPARISON:  05/21/2020 FINDINGS: The cardiomediastinal silhouette is unchanged with normal heart size. Aortic atherosclerosis is noted. Chronic coarsening of the interstitial markings is again noted. No acute airspace consolidation, overt pulmonary edema, pleural effusion, or pneumothorax is identified. No acute osseous abnormality is seen. IMPRESSION: No active cardiopulmonary disease. Electronically Signed   By: Logan Bores M.D.   On: 12/31/2021 11:06   CT Angio Abd/Pel W and/or Wo Contrast  Result Date: 12/31/2021 CLINICAL DATA:  Concern for mesenteric ischemia. EXAM: CTA ABDOMEN AND PELVIS WITHOUT AND WITH CONTRAST TECHNIQUE: Multidetector CT imaging of the abdomen and pelvis was performed using the standard protocol during bolus administration of intravenous contrast. Multiplanar reconstructed images and MIPs were obtained and reviewed to evaluate the vascular anatomy. CONTRAST:  17mL OMNIPAQUE IOHEXOL 350 MG/ML SOLN COMPARISON:  None. FINDINGS: VASCULAR Aorta: There is a large amount of irregular mixed calcified though predominantly noncalcified atherosclerotic plaque throughout the normal caliber abdominal aorta, not definitively resulting in a hemodynamically significant stenosis. No abdominal aortic dissection or periaortic stranding. Celiac: There is a minimal amount of mixed calcified and noncalcified  atherosclerotic plaque involving the origin the celiac artery, not definitively resulting in a hemodynamically significant stenosis. Conventional branching pattern. SMA: Widely patent without a hemodynamically significant narrowing. Conventional branching pattern. The distal tributaries of the SMA appear widely patent without discrete lumen filling defect to suggest distal embolism. Renals: Duplicated bilaterally; there are tiny accessory bilateral renal arteries which supply the superior poles of the bilateral kidneys. Both dominant renal arteries are widely patent without hemodynamically significant narrowing. No vessel irregularity to suggest FMD. 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 Inflow: There is a minimal amount of eccentric mixed calcified and noncalcified atherosclerotic plaque involving the bilateral common and external iliac arteries, not definitely resulting in a hemodynamically significant stenosis. The bilateral internal iliac arteries are diseased though patent and of normal caliber. Proximal Outflow: There is a minimal amount of mixed calcified and noncalcified atherosclerotic plaque involving the bilateral common femoral arteries, not resulting in hemodynamically significant stenosis. The imaged portions of the bilateral deep and superficial femoral arteries are of normal caliber and widely patent without a hemodynamically significant narrowing. Veins: The IVC and pelvic venous systems appear patent. Review of the MIP images confirms the above findings. _________________________________________________________ NON-VASCULAR Lower chest: Limited visualization of the lower thorax is negative for focal airspace opacity or pleural effusion. Normal heart size.  No pericardial effusion. Hepatobiliary: There is diffuse decreased attenuation of the hepatic parenchyma as could be seen in the setting of hepatic steatosis. Mild nodularity of  the hepatic contour (representative image 61, series 6). No discrete hepatic lesions. Normal appearance of the gallbladder given degree distention. No radiopaque gallstones. No intra or extrahepatic biliary duct dilatation. No ascites. Pancreas: Normal appearance of the pancreas. Spleen: Normal appearance of the spleen. Adrenals/Urinary Tract: There is symmetric enhancement of the bilateral kidneys. No evidence of nephrolithiasis on this postcontrast examination. No discrete renal lesions. There is a minimal amount of grossly symmetric likely age and body habitus related perinephric stranding. No urinary obstruction. Normal appearance the bilateral adrenal glands. Normal appearance of the urinary bladder given  degree of distention. Stomach/Bowel: There is circumferential wall thickening involving the splenic flexure of the colon (axial images 69 through 103, series 6 coronal images 35 through 61, series 9). Moderate colonic stool burden without evidence of enteric obstruction. Normal appearance of the terminal ileum and appendix. No significant hiatal hernia. No pneumoperitoneum, pneumatosis or portal venous gas. Lymphatic: No bulky retroperitoneal, mesenteric, pelvic or inguinal lymphadenopathy. Reproductive: Post hysterectomy.  No discrete adnexal lesions. Other: Well-healed vertically oriented midline abdominal incision without hernia. Musculoskeletal: No acute or aggressive osseous abnormalities. Post right total hip replacement without evidence of hardware failure or loosening within is imaged components. Mild-to-moderate multilevel lumbar spine DDD, worse at L3-L4 and L5-S1 with disc space height loss, endplate irregularity and sclerosis. IMPRESSION: VASCULAR 1. Large amount of atherosclerotic plaque within normal caliber abdominal aorta, not resulting in a hemodynamically significant stenosis. Aortic Atherosclerosis (ICD10-I70.0). 2. The major branch vessels of the abdominal aorta appear widely patent without  evidence of a hemodynamically significant narrowing. NON-VASCULAR 1. Short-segment circumferential wall thickening involving the splenic flexure of the colon, not resulting in enteric obstruction. Findings are nonspecific though could be seen in the setting of a localized enteritis. No evidence of perforation or definable/drainable fluid collection. No pneumatosis or portal venous gas. 2. Suspected hepatic steatosis with mild nodularity of the hepatic contour, as could be seen in the setting of early cirrhotic change. Correlation with LFTs is advised. Electronically Signed   By: Sandi Mariscal M.D.   On: 12/31/2021 14:57    EKG: Independently reviewed.  87 bpm. Sinus rhythm with PACs.  QTc 459.  Flat T wave in aVL.  Assessment/Plan  Principal Problem:    Acute GI bleeding Bright red blood, likely lower GI bleed but also could have an upper GI component. Patient is significantly symptomatic at this time. Plan:   Start IV pantoprazole q12 hours.  Stop home aspirin. Monitor H/H.  If Hemoglobin is less than 7, then: Transfuse 2 units PRBC's.  Consult GI in AM if patient has further bleeding or Hb drops.   Active Problems:    Colitis Segment of colon appears to have colitis.  Patient with significant diarrhea. Plan: Blood cultures, stool cultures, C. difficile ordered as part of patient's admission.  Empiric IV Zosyn for possible intra-abdominal infection.  IV fluids.    Atrial fibrillation with RVR ([place]) Has known paroxysmal atrial fibrillation and developed atrial fibrillation with RVR in the emergency department. Plan: IV diltiazem x1.  Continue home oral diltiazem. Update: Still with RVR; will give another dose IV diltiazem and if no improvement then start IV diltiazem drip.    COPD, mild ([place]) Plan: Flovent HFA.  Monitor for any exacerbation.    GERD without esophagitis Plan: IV pantoprazole.    Hypertension, primary Plan: Metoprolol, diltiazem.  While hospitalized, temporarily  changed change valsartan hydrochlorothiazide to ARB on formulary without hydrochlorothiazide because patient is dehydrated and does not need a diuretic.    Medical Decision Making Amount and/or Complexity of Data Reviewed External Data Reviewed: labs, radiology, ECG and notes.    Details: Labs include creatinine 0.57, potassium 4.1, glucose 111, troponin high-sensitivity 3, WBCs 13.3, hemoglobin 16, platelets 242, lactic acid 1, liver function test within normal limits, INR 0.9.  SARS-CoV-2 PCR and influenza testing are all negative. Labs: ordered.    Details: CBC, metabolic panel, blood cultures, stool cultures, c diff on admission Radiology: independent interpretation performed.    Details: Chest x -ray: no acute process. No evidence of pneumonia. CTA of the  abdomen and pelvis showed localized wall thickening of colon consistent with localized infection. ECG/medicine tests: independent interpretation performed.    Details: See note Discussion of management or test interpretation with external provider(s): Developed A fib with RVR. Gave IV diltiazem; if doses are not successful plan to start IV diltiazem drip.  Risk Prescription drug management. Drug therapy requiring intensive monitoring for toxicity. Decision regarding hospitalization.    Time spent: 85 minutes.   DVT prophylaxis: SCDs  Code Status:   Full Code Family Communication:  None    Disposition Plan:   Patient is from:  Home  Anticipated DC to:  Home  Anticipated DC date:  01/03/22  Anticipated DC barriers: ongoing infection  Consults called:  None  Admission status:  Inpatient   Severity of Illness: The appropriate patient status for this patient is INPATIENT. Inpatient status is judged to be reasonable and necessary in order to provide the required intensity of service to ensure the patient's safety. The patient's presenting symptoms, physical exam findings, and initial radiographic and laboratory data in the  context of their chronic comorbidities is felt to place them at high risk for further clinical deterioration. Furthermore, it is not anticipated that the patient will be medically stable for discharge from the hospital within 2 midnights of admission.   * I certify that at the point of admission it is my clinical judgment that the patient will require inpatient hospital care spanning beyond 2 midnights from the point of admission due to high intensity of service, high risk for further deterioration and high frequency of surveillance required.Tacey Ruiz MD [facility]  How to contact the [facility] Attending or Consulting provider [place] or covering provider during after hours [place], for this patient?   Check the care team in [facility] and look for a) attending/consulting [facility] provider listed and b) the [facility] team listed Log into [email or web address] and use [place]'s universal password to access. If you do not have the password, please contact the hospital operator. Locate the [facility] provider you are looking for under [facility] and page to a number that you can be directly reached. If you still have difficulty reaching the provider, please page the [facility] (Director on Call) for the Hospitalists listed on amion for assistance.  12/31/2021, 7:07 PM

## 2021-12-31 NOTE — Progress Notes (Signed)
Pharmacy Antibiotic Note  Sabrina Hudson is a 71 y.o. female admitted on 12/31/2021 with PMH atrial fibrillation found to have  intraabdominal infection . Presented with abdominal pain and diarrhea x 24 hours, of which progressed to bright red blood stool. Pharmacy has been consulted for piperacillin/tazobactam dosing.  CT of the abdomen shows short-segment circumferential wall thickening involving the splenic flexure of the colon. WBC at 13.3 and afebrile thus far. Hgb stable at 16 and Plts WNL. Scr consistent with baseline.   Plan: Piperacillin/tazobactam 3.375 grams every 8 hours (4 hour infusion) Monitor renal function, clinical course, LOT.  Height: 5\' 6"  (167.6 cm) Weight: 82.6 kg (182 lb 1.6 oz) IBW/kg (Calculated) : 59.3  Temp (24hrs), Avg:98.1 F (36.7 C), Min:98.1 F (36.7 C), Max:98.1 F (36.7 C)  Recent Labs  Lab 12/31/21 1033 12/31/21 1428  WBC 13.3*  --   CREATININE 0.57  --   LATICACIDVEN  --  1.0    Estimated Creatinine Clearance: 72.9 mL/min (by C-G formula based on SCr of 0.57 mg/dL).    Allergies  Allergen Reactions   Simvastatin Other (See Comments)    Muscle pain    Antimicrobials this admission: Piperacillin/tazobactam 1/4 >>   Dose adjustments this admission: N/a  Microbiology results: 1/4 BCx: needs to be collected 1/4 GI panel: needs to be collected  Thank you for allowing pharmacy to be a part of this patients care.   Wynelle Cleveland, PharmD Pharmacy Resident  12/31/2021 4:21 PM

## 2021-12-31 NOTE — ED Triage Notes (Signed)
Pt comes into the ED Via POV c/o chest pain, near syncopal episode, and diarrhea with blood present.  Pt states the chest pain started last night, but overall she has had a cough and diarrhea since the weekend.  Pt currently has even and unlabored respirations at this time.  Pt states the chest pain is centrally located and in the epigastric area.

## 2022-01-01 DIAGNOSIS — K922 Gastrointestinal hemorrhage, unspecified: Secondary | ICD-10-CM | POA: Diagnosis present

## 2022-01-01 LAB — CBC WITH DIFFERENTIAL/PLATELET
Abs Immature Granulocytes: 0.04 10*3/uL (ref 0.00–0.07)
Basophils Absolute: 0.1 10*3/uL (ref 0.0–0.1)
Basophils Relative: 1 %
Eosinophils Absolute: 0.2 10*3/uL (ref 0.0–0.5)
Eosinophils Relative: 1 %
HCT: 41.7 % (ref 36.0–46.0)
Hemoglobin: 14.1 g/dL (ref 12.0–15.0)
Immature Granulocytes: 0 %
Lymphocytes Relative: 22 %
Lymphs Abs: 3.2 10*3/uL (ref 0.7–4.0)
MCH: 31.7 pg (ref 26.0–34.0)
MCHC: 33.8 g/dL (ref 30.0–36.0)
MCV: 93.7 fL (ref 80.0–100.0)
Monocytes Absolute: 1 10*3/uL (ref 0.1–1.0)
Monocytes Relative: 7 %
Neutro Abs: 10.1 10*3/uL — ABNORMAL HIGH (ref 1.7–7.7)
Neutrophils Relative %: 69 %
Platelets: 212 10*3/uL (ref 150–400)
RBC: 4.45 MIL/uL (ref 3.87–5.11)
RDW: 13.4 % (ref 11.5–15.5)
WBC: 14.6 10*3/uL — ABNORMAL HIGH (ref 4.0–10.5)
nRBC: 0 % (ref 0.0–0.2)

## 2022-01-01 LAB — CBC
HCT: 43.6 % (ref 36.0–46.0)
Hemoglobin: 14.2 g/dL (ref 12.0–15.0)
MCH: 30.3 pg (ref 26.0–34.0)
MCHC: 32.6 g/dL (ref 30.0–36.0)
MCV: 93 fL (ref 80.0–100.0)
Platelets: 214 10*3/uL (ref 150–400)
RBC: 4.69 MIL/uL (ref 3.87–5.11)
RDW: 13.4 % (ref 11.5–15.5)
WBC: 12.7 10*3/uL — ABNORMAL HIGH (ref 4.0–10.5)
nRBC: 0 % (ref 0.0–0.2)

## 2022-01-01 LAB — BASIC METABOLIC PANEL
Anion gap: 9 (ref 5–15)
BUN: 6 mg/dL — ABNORMAL LOW (ref 8–23)
CO2: 28 mmol/L (ref 22–32)
Calcium: 8.4 mg/dL — ABNORMAL LOW (ref 8.9–10.3)
Chloride: 104 mmol/L (ref 98–111)
Creatinine, Ser: 0.63 mg/dL (ref 0.44–1.00)
GFR, Estimated: >60 mL/min (ref >60)
Glucose, Bld: 120 mg/dL — ABNORMAL HIGH (ref 70–99)
Potassium: 3.2 mmol/L — ABNORMAL LOW (ref 3.5–5.1)
Sodium: 141 mmol/L (ref 135–145)

## 2022-01-01 LAB — GASTROINTESTINAL PANEL BY PCR, STOOL (REPLACES STOOL CULTURE)

## 2022-01-01 LAB — LACTIC ACID, PLASMA
Lactic Acid, Venous: 1.2 mmol/L (ref 0.5–1.9)
Lactic Acid, Venous: 1.3 mmol/L (ref 0.5–1.9)

## 2022-01-01 LAB — HEMOGLOBIN AND HEMATOCRIT, BLOOD
HCT: 45.3 % (ref 36.0–46.0)
Hemoglobin: 14.8 g/dL (ref 12.0–15.0)

## 2022-01-01 LAB — HIV ANTIBODY (ROUTINE TESTING W REFLEX): HIV Screen 4th Generation wRfx: NONREACTIVE

## 2022-01-01 MED ORDER — DILTIAZEM HCL-DEXTROSE 125-5 MG/125ML-% IV SOLN (PREMIX)
5.0000 mg/h | INTRAVENOUS | Status: DC
  Administered 2022-01-01: 5 mg/h via INTRAVENOUS
  Filled 2022-01-01: qty 125

## 2022-01-01 MED ORDER — ALBUTEROL SULFATE (2.5 MG/3ML) 0.083% IN NEBU
2.5000 mg | INHALATION_SOLUTION | RESPIRATORY_TRACT | Status: DC | PRN
  Administered 2022-01-01: 2.5 mg via RESPIRATORY_TRACT
  Filled 2022-01-01: qty 3

## 2022-01-01 MED ORDER — POTASSIUM CHLORIDE 20 MEQ PO PACK
40.0000 meq | PACK | ORAL | Status: AC
  Administered 2022-01-01 (×2): 40 meq via ORAL
  Filled 2022-01-01: qty 2

## 2022-01-01 MED ORDER — PIPERACILLIN-TAZOBACTAM 3.375 G IVPB
3.3750 g | Freq: Three times a day (TID) | INTRAVENOUS | Status: DC
  Administered 2022-01-01 – 2022-01-03 (×6): 3.375 g via INTRAVENOUS
  Filled 2022-01-01 (×6): qty 50

## 2022-01-01 MED ORDER — IPRATROPIUM-ALBUTEROL 0.5-2.5 (3) MG/3ML IN SOLN
3.0000 mL | Freq: Four times a day (QID) | RESPIRATORY_TRACT | Status: DC
  Administered 2022-01-01 – 2022-01-02 (×3): 3 mL via RESPIRATORY_TRACT
  Filled 2022-01-01 (×4): qty 3

## 2022-01-01 NOTE — Progress Notes (Signed)
PROGRESS NOTE    Sabrina Hudson  XBD:[id number] DOB: 25-Jul-1951 DOA: 12/31/2021 PCP: Derinda Late, MD    Brief Narrative:  71 year old with history of paroxysmal A. fib and currently on aspirin only, was on Eliquis until July 2022, COPD, GERD, hypertension hyperlipidemia and hard of hearing presented with abdominal pain, diarrhea and bright red blood per rectum.  In the emergency room, hemodynamically stable.  Chest x-ray is normal.  CT of the abdomen pelvis with localized inflammation and splenic flexure of the colon.  Admitted and treated as acute diverticulitis and diverticular bleed.  Found to be in A. fib.   Assessment & Plan:   Principal Problem:   Acute GI bleeding Active Problems:   Atrial fibrillation with RVR (HCC)   Colitis   COPD, mild (HCC)   GERD without esophagitis   GI bleeding  Acute lower GI bleeding, likely diverticular bleeding with colitis: Blood cultures, stool cultures, C. difficile pending.  Patient on empiric IV Zosyn. Bleeding is likely from colitis, hemoglobin has remained stable.  Conservative management expected. Will need interval colonoscopy after improvement. Recent colonoscopy with extensive diverticulosis.  Will monitor closely.  Do not anticipate any procedure unless drop in hemoglobin or excessive bleeding.  A. fib with RVR: Known paroxysmal A. fib.  Currently rate controlled.  On oral Cardizem from home. Patient was started on IV Cardizem due to difficulty rate control.  Cannot be anticoagulated currently with ongoing GI issues. Resume home dose of Cardizem and metoprolol.  Discontinue Cardizem infusion.  COPD/GERD/essential hypertension: Fairly stable on home medications.  Hypokalemia: We will replace.  Monitor levels.  Patient was able to come off of Cardizem infusion.  She can go to MedSurg bed.   DVT prophylaxis: SCDs Start: 12/31/21 1558 Place and maintain sequential compression device Start: 12/31/21 1545   Code Status: Full  code Family Communication: Daughter at the bedside Disposition Plan: Status is: Inpatient  Remains inpatient appropriate because: Active treatment, GI bleeding.         Consultants:  None  Procedures:  None  Antimicrobials:  Zosyn 1/4---   Subjective: Overnight she still frequency went to bathroom.  She had 3 bloody bowel movements but now getting less frequent.  Denies any abdomen pain today.  No nausea or vomiting. Telemetry review, rate controlled atrial fibrillation.  On Cardizem 10. Abdominal pain has improved.  Some soreness on the left upper quadrant.  Objective: Vitals:   01/01/22 1315 01/01/22 1330 01/01/22 1345 01/01/22 1400  BP:      Pulse: 68 72 65 71  Resp: 16 15 17 12   Temp:      TempSrc:      SpO2: 92% 92% 92% 96%  Weight:      Height:        Intake/Output Summary (Last 24 hours) at 01/01/2022 1415 Last data filed at 01/01/2022 1111 Gross per 24 hour  Intake 4173.34 ml  Output --  Net 4173.34 ml   Filed Weights   12/31/21 1030  Weight: 82.6 kg    Examination:  General: Looks comfortable at rest. Cardiovascular: S1-S2 normal.  Irregularly irregular. Respiratory: Bilateral clear.  No added sounds. Gastrointestinal: Soft and nontender.  Bowel sound present. Ext: No swelling or edema. Neuro: Alert oriented x4.  Hard of hearing.    Data Reviewed: I have personally reviewed following labs and imaging studies  CBC: Recent Labs  Lab 12/31/21 1033 12/31/21 2358 01/01/22 0500  WBC 13.3*  --  12.7*  HGB 16.0* 14.8 14.2  HCT  47.9* 45.3 43.6  MCV 93.7  --  93.0  PLT 242  --  983   Basic Metabolic Panel: Recent Labs  Lab 12/31/21 1033 12/31/21 1631 01/01/22 0500  NA 136  --  141  K 4.1  --  3.2*  CL 100  --  104  CO2 24  --  28  GLUCOSE 111*  --  120*  BUN 9  --  6*  CREATININE 0.57  --  0.63  CALCIUM 8.8*  --  8.4*  MG  --  2.2  --    GFR: Estimated Creatinine Clearance: 72.9 mL/min (by C-G formula based on SCr of 0.63  mg/dL). Liver Function Tests: Recent Labs  Lab 12/31/21 1428  AST 20  ALT 20  ALKPHOS 112  BILITOT 0.6  PROT 7.1  ALBUMIN 4.2   No results for input(s): LIPASE, AMYLASE in the last 168 hours. No results for input(s): AMMONIA in the last 168 hours. Coagulation Profile: Recent Labs  Lab 12/31/21 1428  INR 0.9   Cardiac Enzymes: No results for input(s): CKTOTAL, CKMB, CKMBINDEX, TROPONINI in the last 168 hours. BNP (last 3 results) No results for input(s): PROBNP in the last 8760 hours. HbA1C: No results for input(s): HGBA1C in the last 72 hours. CBG: No results for input(s): GLUCAP in the last 168 hours. Lipid Profile: No results for input(s): CHOL, HDL, LDLCALC, TRIG, CHOLHDL, LDLDIRECT in the last 72 hours. Thyroid Function Tests: No results for input(s): TSH, T4TOTAL, FREET4, T3FREE, THYROIDAB in the last 72 hours. Anemia Panel: No results for input(s): VITAMINB12, FOLATE, FERRITIN, TIBC, IRON, RETICCTPCT in the last 72 hours. Sepsis Labs: Recent Labs  Lab 12/31/21 1428 12/31/21 1631 12/31/21 2358 01/01/22 0600  LATICACIDVEN 1.0 2.1* 1.3 1.2    Recent Results (from the past 240 hour(s))  Resp Panel by RT-PCR (Flu A&B, Covid) Nasopharyngeal Swab     Status: None   Collection Time: 12/31/21 10:34 AM   Specimen: Nasopharyngeal Swab; Nasopharyngeal(NP) swabs in vial transport medium  Result Value Ref Range Status   SARS Coronavirus 2 by RT PCR NEGATIVE NEGATIVE Final    Comment: (NOTE) SARS-CoV-2 target nucleic acids are NOT DETECTED.  The SARS-CoV-2 RNA is generally detectable in upper respiratory specimens during the acute phase of infection. The lowest concentration of SARS-CoV-2 viral copies this assay can detect is 138 copies/mL. A negative result does not preclude SARS-Cov-2 infection and should not be used as the sole basis for treatment or other patient management decisions. A negative result may occur with  improper specimen collection/handling,  submission of specimen other than nasopharyngeal swab, presence of viral mutation(s) within the areas targeted by this assay, and inadequate number of viral copies(<138 copies/mL). A negative result must be combined with clinical observations, patient history, and epidemiological information. The expected result is Negative.  Fact Sheet for Patients:  EntrepreneurPulse.com.au  Fact Sheet for Healthcare Providers:  IncredibleEmployment.be  This test is no t yet approved or cleared by the [place] FDA and  has been authorized for detection and/or diagnosis of SARS-CoV-2 by FDA under an Emergency Use Authorization (EUA). This EUA will remain  in effect (meaning this test can be used) for the duration of the COVID-19 declaration under Section 564(b)(1) of the Act, 21 U.S.C.section 360bbb-3(b)(1), unless the authorization is terminated  or revoked sooner.       Influenza A by PCR NEGATIVE NEGATIVE Final   Influenza B by PCR NEGATIVE NEGATIVE Final    Comment: (NOTE) The Xpert Xpress  SARS-CoV-2/FLU/RSV plus assay is intended as an aid in the diagnosis of influenza from Nasopharyngeal swab specimens and should not be used as a sole basis for treatment. Nasal washings and aspirates are unacceptable for Xpert Xpress SARS-CoV-2/FLU/RSV testing.  Fact Sheet for Patients: EntrepreneurPulse.com.au  Fact Sheet for Healthcare Providers: IncredibleEmployment.be  This test is not yet approved or cleared by the [place] FDA and has been authorized for detection and/or diagnosis of SARS-CoV-2 by FDA under an Emergency Use Authorization (EUA). This EUA will remain in effect (meaning this test can be used) for the duration of the COVID-19 declaration under Section 564(b)(1) of the Act, 21 U.S.C. section 360bbb-3(b)(1), unless the authorization is terminated or revoked.  Performed at [facility], [address] [place], [place] [address]   CULTURE, BLOOD (ROUTINE X 2) w Reflex to ID Panel     Status: None (Preliminary result)   Collection Time: 12/31/21  4:31 PM   Specimen: BLOOD  Result Value Ref Range Status   Specimen Description BLOOD BLOOD LEFT WRIST  Final   Special Requests   Final    BOTTLES DRAWN AEROBIC AND ANAEROBIC Blood Culture adequate volume   Culture   Final    NO GROWTH < 12 HOURS Performed at [facility], [address] [place], [place] [address]    Report Status PENDING  Incomplete  CULTURE, BLOOD (ROUTINE X 2) w Reflex to ID Panel     Status: None (Preliminary result)   Collection Time: 12/31/21  4:31 PM   Specimen: BLOOD  Result Value Ref Range Status   Specimen Description BLOOD RIGHT ANTECUBITAL  Final   Special Requests   Final    BOTTLES DRAWN AEROBIC AND ANAEROBIC Blood Culture adequate volume   Culture   Final    NO GROWTH < 12 HOURS Performed at [facility], [address], [place], [place] [address]    Report Status PENDING  Incomplete         Radiology Studies: DG Chest 2 View  Result Date: 12/31/2021 CLINICAL DATA:  Chest pain.  Near syncope. EXAM: CHEST - 2 VIEW COMPARISON:  05/21/2020 FINDINGS: The cardiomediastinal silhouette is unchanged with normal heart size. Aortic atherosclerosis is noted. Chronic coarsening of the interstitial markings is again noted. No acute airspace consolidation, overt pulmonary edema, pleural effusion, or pneumothorax is identified. No acute osseous abnormality is seen. IMPRESSION: No active cardiopulmonary disease. Electronically Signed   By: Logan Bores M.D.   On: 12/31/2021 11:06   CT Angio Abd/Pel W and/or Wo Contrast  Result Date: 12/31/2021 CLINICAL DATA:  Concern for mesenteric ischemia. EXAM: CTA ABDOMEN AND PELVIS WITHOUT AND WITH CONTRAST TECHNIQUE: Multidetector CT imaging of the abdomen and pelvis was performed using the standard protocol during bolus administration of  intravenous contrast. Multiplanar reconstructed images and MIPs were obtained and reviewed to evaluate the vascular anatomy. CONTRAST:  160mL OMNIPAQUE IOHEXOL 350 MG/ML SOLN COMPARISON:  None. FINDINGS: VASCULAR Aorta: There is a large amount of irregular mixed calcified though predominantly noncalcified atherosclerotic plaque throughout the normal caliber abdominal aorta, not definitively resulting in a hemodynamically significant stenosis. No abdominal aortic dissection or periaortic stranding. Celiac: There is a minimal amount of mixed calcified and noncalcified atherosclerotic plaque involving the origin the celiac artery, not definitively resulting in a hemodynamically significant stenosis. Conventional branching pattern. SMA: Widely patent without a hemodynamically significant narrowing. Conventional branching pattern. The distal tributaries of the SMA appear widely patent without discrete lumen filling defect to suggest  distal embolism. Renals: Duplicated bilaterally; there are tiny accessory bilateral renal arteries which supply the superior poles of the bilateral kidneys. Both dominant renal arteries are widely patent without hemodynamically significant narrowing. No vessel irregularity to suggest FMD. 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 Inflow: There is a minimal amount of eccentric mixed calcified and noncalcified atherosclerotic plaque involving the bilateral common and external iliac arteries, not definitely resulting in a hemodynamically significant stenosis. The bilateral internal iliac arteries are diseased though patent and of normal caliber. Proximal Outflow: There is a minimal amount of mixed calcified and noncalcified atherosclerotic plaque involving the bilateral common femoral arteries, not resulting in hemodynamically significant stenosis. The imaged portions of the bilateral deep and superficial femoral arteries are of  normal caliber and widely patent without a hemodynamically significant narrowing. Veins: The IVC and pelvic venous systems appear patent. Review of the MIP images confirms the above findings. _________________________________________________________ NON-VASCULAR Lower chest: Limited visualization of the lower thorax is negative for focal airspace opacity or pleural effusion. Normal heart size.  No pericardial effusion. Hepatobiliary: There is diffuse decreased attenuation of the hepatic parenchyma as could be seen in the setting of hepatic steatosis. Mild nodularity of the hepatic contour (representative image 61, series 6). No discrete hepatic lesions. Normal appearance of the gallbladder given degree distention. No radiopaque gallstones. No intra or extrahepatic biliary duct dilatation. No ascites. Pancreas: Normal appearance of the pancreas. Spleen: Normal appearance of the spleen. Adrenals/Urinary Tract: There is symmetric enhancement of the bilateral kidneys. No evidence of nephrolithiasis on this postcontrast examination. No discrete renal lesions. There is a minimal amount of grossly symmetric likely age and body habitus related perinephric stranding. No urinary obstruction. Normal appearance the bilateral adrenal glands. Normal appearance of the urinary bladder given degree of distention. Stomach/Bowel: There is circumferential wall thickening involving the splenic flexure of the colon (axial images 69 through 103, series 6 coronal images 35 through 61, series 9). Moderate colonic stool burden without evidence of enteric obstruction. Normal appearance of the terminal ileum and appendix. No significant hiatal hernia. No pneumoperitoneum, pneumatosis or portal venous gas. Lymphatic: No bulky retroperitoneal, mesenteric, pelvic or inguinal lymphadenopathy. Reproductive: Post hysterectomy.  No discrete adnexal lesions. Other: Well-healed vertically oriented midline abdominal incision without hernia.  Musculoskeletal: No acute or aggressive osseous abnormalities. Post right total hip replacement without evidence of hardware failure or loosening within is imaged components. Mild-to-moderate multilevel lumbar spine DDD, worse at L3-L4 and L5-S1 with disc space height loss, endplate irregularity and sclerosis. IMPRESSION: VASCULAR 1. Large amount of atherosclerotic plaque within normal caliber abdominal aorta, not resulting in a hemodynamically significant stenosis. Aortic Atherosclerosis (ICD10-I70.0). 2. The major branch vessels of the abdominal aorta appear widely patent without evidence of a hemodynamically significant narrowing. NON-VASCULAR 1. Short-segment circumferential wall thickening involving the splenic flexure of the colon, not resulting in enteric obstruction. Findings are nonspecific though could be seen in the setting of a localized enteritis. No evidence of perforation or definable/drainable fluid collection. No pneumatosis or portal venous gas. 2. Suspected hepatic steatosis with mild nodularity of the hepatic contour, as could be seen in the setting of early cirrhotic change. Correlation with LFTs is advised. Electronically Signed   By: Sandi Mariscal M.D.   On: 12/31/2021 14:57        Scheduled Meds:  budesonide (PULMICORT) nebulizer solution  0.25 mg Nebulization BID   diltiazem  180 mg Oral Daily   DULoxetine  60  mg Oral Daily   DULoxetine  60 mg Oral Once   ezetimibe  10 mg Oral Daily   ipratropium-albuterol  3 mL Nebulization Q6H   irbesartan  37.5 mg Oral Daily   metoprolol tartrate  25 mg Oral BID   pantoprazole (PROTONIX) IV  40 mg Intravenous BID   Continuous Infusions:  lactated ringers Stopped (01/01/22 0351)   piperacillin-tazobactam (ZOSYN)  IV 3.375 g (01/01/22 1051)     LOS: 1 day    Time spent: 35 minutes    Barb Merino, MD [facility] Pager [phone]

## 2022-01-01 NOTE — Care Management Important Message (Signed)
Important Message  Patient Details  Name: Sabrina Hudson MRN: [id number] Date of Birth: Apr 28, 1951   Medicare Important Message Given:  N/A - LOS <3 / Initial given by admissions     Dannette Barbara 01/01/2022, 7:38 PM

## 2022-01-01 NOTE — ED Notes (Addendum)
This RN contacted Dr. Sloan Leiter, notifying him that her HR is 60's-70's and hit 50's once since 1050. Dr. Sloan Leiter states to d/c cardizem drip. See MAR.

## 2022-01-01 NOTE — ED Notes (Signed)
MD at bedside, states follow Cardizem pharmacy protocol (DC Cardizem drip if HR < 60).

## 2022-01-01 NOTE — ED Notes (Signed)
Pt. Is sitting in stretcher, daughter at bedside, call light in reach. This RN updated pt. And daughter on POC, answered questions, and notified pt stool sample still needed. Hat placed in toilet. Pt. Speaking full sentences, denies pain, NAD.

## 2022-01-02 LAB — CBC WITH DIFFERENTIAL/PLATELET
Abs Immature Granulocytes: 0.08 10*3/uL — ABNORMAL HIGH (ref 0.00–0.07)
Abs Immature Granulocytes: 0.1 10*3/uL — ABNORMAL HIGH (ref 0.00–0.07)
Basophils Absolute: 0.1 10*3/uL (ref 0.0–0.1)
Basophils Absolute: 0.1 10*3/uL (ref 0.0–0.1)
Basophils Relative: 0 %
Basophils Relative: 0 %
Eosinophils Absolute: 0.2 10*3/uL (ref 0.0–0.5)
Eosinophils Absolute: 0.3 10*3/uL (ref 0.0–0.5)
Eosinophils Relative: 1 %
Eosinophils Relative: 2 %
HCT: 40.7 % (ref 36.0–46.0)
HCT: 38.9 % (ref 36.0–46.0)
Hemoglobin: 13.3 g/dL (ref 12.0–15.0)
Hemoglobin: 13 g/dL (ref 12.0–15.0)
Immature Granulocytes: 1 %
Immature Granulocytes: 1 %
Lymphocytes Relative: 20 %
Lymphocytes Relative: 22 %
Lymphs Abs: 2.7 10*3/uL (ref 0.7–4.0)
Lymphs Abs: 3.1 10*3/uL (ref 0.7–4.0)
MCH: 31.1 pg (ref 26.0–34.0)
MCH: 31.6 pg (ref 26.0–34.0)
MCHC: 32.7 g/dL (ref 30.0–36.0)
MCHC: 33.4 g/dL (ref 30.0–36.0)
MCV: 95.3 fL (ref 80.0–100.0)
MCV: 94.4 fL (ref 80.0–100.0)
Monocytes Absolute: 1.1 10*3/uL — ABNORMAL HIGH (ref 0.1–1.0)
Monocytes Absolute: 1.2 10*3/uL — ABNORMAL HIGH (ref 0.1–1.0)
Monocytes Relative: 8 %
Monocytes Relative: 9 %
Neutro Abs: 9.5 10*3/uL — ABNORMAL HIGH (ref 1.7–7.7)
Neutro Abs: 9.6 10*3/uL — ABNORMAL HIGH (ref 1.7–7.7)
Neutrophils Relative %: 70 %
Neutrophils Relative %: 66 %
Platelets: 183 10*3/uL (ref 150–400)
Platelets: 194 10*3/uL (ref 150–400)
RBC: 4.27 MIL/uL (ref 3.87–5.11)
RBC: 4.12 MIL/uL (ref 3.87–5.11)
RDW: 13.4 % (ref 11.5–15.5)
RDW: 13.3 % (ref 11.5–15.5)
WBC: 13.6 10*3/uL — ABNORMAL HIGH (ref 4.0–10.5)
WBC: 14.4 10*3/uL — ABNORMAL HIGH (ref 4.0–10.5)
nRBC: 0 % (ref 0.0–0.2)
nRBC: 0 % (ref 0.0–0.2)

## 2022-01-02 LAB — BASIC METABOLIC PANEL
Anion gap: 7 (ref 5–15)
BUN: 6 mg/dL — ABNORMAL LOW (ref 8–23)
CO2: 27 mmol/L (ref 22–32)
Calcium: 8.4 mg/dL — ABNORMAL LOW (ref 8.9–10.3)
Chloride: 106 mmol/L (ref 98–111)
Creatinine, Ser: 0.79 mg/dL (ref 0.44–1.00)
GFR, Estimated: >60 mL/min (ref >60)
Glucose, Bld: 128 mg/dL — ABNORMAL HIGH (ref 70–99)
Potassium: 3.9 mmol/L (ref 3.5–5.1)
Sodium: 140 mmol/L (ref 135–145)

## 2022-01-02 LAB — MAGNESIUM: Magnesium: 1.9 mg/dL (ref 1.7–2.4)

## 2022-01-02 MED ORDER — POTASSIUM CHLORIDE CRYS ER 20 MEQ PO TBCR
20.0000 meq | EXTENDED_RELEASE_TABLET | Freq: Once | ORAL | Status: AC
  Administered 2022-01-02: 20 meq via ORAL
  Filled 2022-01-02: qty 1

## 2022-01-02 MED ORDER — MAGNESIUM SULFATE 2 GM/50ML IV SOLN
2.0000 g | Freq: Once | INTRAVENOUS | Status: AC
  Administered 2022-01-02: 2 g via INTRAVENOUS
  Filled 2022-01-02: qty 50

## 2022-01-02 NOTE — Clinical Social Work Note (Signed)
°  Transition of Care [facility]) Screening Note   Patient Details  Name: EDIA PURSIFULL Date of Birth: 01-02-1951   Transition of Care [facility]) CM/SW Contact:    Eileen Stanford, LCSW Phone [place] 01/02/2022, 3:10 PM    Transition of Care Department [facility]) has reviewed patient and no TOC needs have been identified at this time. We will continue to monitor patient advancement through interdisciplinary progression rounds. If new patient transition needs arise, please place a TOC consult.

## 2022-01-02 NOTE — Progress Notes (Signed)
PROGRESS NOTE    Sabrina Hudson  HQI:[id number] DOB: 01-Sep-1951 DOA: 12/31/2021 PCP: Derinda Late, MD    Brief Narrative:  71 year old with history of paroxysmal A. fib and currently on aspirin only, was on Eliquis until July 2022, COPD, GERD, hypertension hyperlipidemia and hard of hearing presented with abdominal pain, diarrhea and bright red blood per rectum.  In the emergency room, hemodynamically stable.  Chest x-ray is normal.  CT of the abdomen pelvis with localized inflammation and splenic flexure of the colon.  Admitted and treated as acute diverticulitis and diverticular bleed.  Found to be in A. fib.   Assessment & Plan:   Principal Problem:   Acute GI bleeding Active Problems:   Atrial fibrillation with RVR (HCC)   Colitis   COPD, mild (HCC)   GERD without esophagitis   GI bleeding  Acute lower GI bleeding, diverticular bleeding with colitis: Blood cultures negative.  GI pathogen panel negative for common organism.  Still not consistent with C. difficile. Will treat with IV Zosyn and ultimately convert to oral Augmentin anticipate tomorrow. Recent colonoscopy with extensive diverticulosis.  Will monitor closely.  Do not anticipate any procedure unless drop in hemoglobin or excessive bleeding.  Outpatient GI follow-up after discharge.  A. fib with RVR: Known paroxysmal A. fib.  Currently rate controlled.  On oral Cardizem from home. Treated with IV Cardizem infusion.  Currently rate controlled on Cardizem and metoprolol.  Sinus rhythm today.  Cannot be anticoagulated due to ongoing GI bleeding.  COPD/GERD/essential hypertension: Fairly stable on home medications.  Hypokalemia: Replaced and adequate.    DVT prophylaxis: SCDs Start: 12/31/21 1558 Place and maintain sequential compression device Start: 12/31/21 1545   Code Status: Full code Family Communication: Daughter at the bedside Disposition Plan: Status is: Inpatient  Remains inpatient appropriate  because: Active treatment, GI bleeding.         Consultants:  None  Procedures:  None  Antimicrobials:  Zosyn 1/4---   Subjective:  Patient seen and examined.  Denies any nausea vomiting.  Mild sore left lower quadrant but mostly improved. 5-6 small slimy and bloody stool overnight.  No frank bleeding.  Objective: Vitals:   01/01/22 2004 01/01/22 2039 01/02/22 0554 01/02/22 0843  BP: 130/70  123/77 (!) 126/59  Pulse: 73  70 71  Resp:   18 20  Temp: 98.7 F (37.1 C)  99.1 F (37.3 C) 98.3 F (36.8 C)  TempSrc: Oral  Oral Oral  SpO2: 96% 94% 96% 94%  Weight:      Height:        Intake/Output Summary (Last 24 hours) at 01/02/2022 1229 Last data filed at 01/02/2022 0300 Gross per 24 hour  Intake 1895.63 ml  Output --  Net 1895.63 ml   Filed Weights   12/31/21 1030  Weight: 82.6 kg    Examination:  General: Looks comfortable at rest. Cardiovascular: S1-S2 normal. RRR Respiratory: Bilateral clear.  No added sounds. Gastrointestinal: Soft and nontender.  Bowel sound present. Ext: No swelling or edema. Neuro: Alert oriented x4.  Hard of hearing.    Data Reviewed: I have personally reviewed following labs and imaging studies  CBC: Recent Labs  Lab 12/31/21 1033 12/31/21 2358 01/01/22 0500 01/01/22 1712 01/02/22 0619  WBC 13.3*  --  12.7* 14.6* 13.6*  NEUTROABS  --   --   --  10.1* 9.5*  HGB 16.0* 14.8 14.2 14.1 13.3  HCT 47.9* 45.3 43.6 41.7 40.7  MCV 93.7  --  93.0 93.7  95.3  PLT 242  --  214 212 841   Basic Metabolic Panel: Recent Labs  Lab 12/31/21 1033 12/31/21 1631 01/01/22 0500 01/02/22 0619  NA 136  --  141 140  K 4.1  --  3.2* 3.9  CL 100  --  104 106  CO2 24  --  28 27  GLUCOSE 111*  --  120* 128*  BUN 9  --  6* 6*  CREATININE 0.57  --  0.63 0.79  CALCIUM 8.8*  --  8.4* 8.4*  MG  --  2.2  --  1.9   GFR: Estimated Creatinine Clearance: 72.9 mL/min (by C-G formula based on SCr of 0.79 mg/dL). Liver Function Tests: Recent  Labs  Lab 12/31/21 1428  AST 20  ALT 20  ALKPHOS 112  BILITOT 0.6  PROT 7.1  ALBUMIN 4.2   No results for input(s): LIPASE, AMYLASE in the last 168 hours. No results for input(s): AMMONIA in the last 168 hours. Coagulation Profile: Recent Labs  Lab 12/31/21 1428  INR 0.9   Cardiac Enzymes: No results for input(s): CKTOTAL, CKMB, CKMBINDEX, TROPONINI in the last 168 hours. BNP (last 3 results) No results for input(s): PROBNP in the last 8760 hours. HbA1C: No results for input(s): HGBA1C in the last 72 hours. CBG: No results for input(s): GLUCAP in the last 168 hours. Lipid Profile: No results for input(s): CHOL, HDL, LDLCALC, TRIG, CHOLHDL, LDLDIRECT in the last 72 hours. Thyroid Function Tests: No results for input(s): TSH, T4TOTAL, FREET4, T3FREE, THYROIDAB in the last 72 hours. Anemia Panel: No results for input(s): VITAMINB12, FOLATE, FERRITIN, TIBC, IRON, RETICCTPCT in the last 72 hours. Sepsis Labs: Recent Labs  Lab 12/31/21 1428 12/31/21 1631 12/31/21 2358 01/01/22 0600  LATICACIDVEN 1.0 2.1* 1.3 1.2    Recent Results (from the past 240 hour(s))  Resp Panel by RT-PCR (Flu A&B, Covid) Nasopharyngeal Swab     Status: None   Collection Time: 12/31/21 10:34 AM   Specimen: Nasopharyngeal Swab; Nasopharyngeal(NP) swabs in vial transport medium  Result Value Ref Range Status   SARS Coronavirus 2 by RT PCR NEGATIVE NEGATIVE Final    Comment: (NOTE) SARS-CoV-2 target nucleic acids are NOT DETECTED.  The SARS-CoV-2 RNA is generally detectable in upper respiratory specimens during the acute phase of infection. The lowest concentration of SARS-CoV-2 viral copies this assay can detect is 138 copies/mL. A negative result does not preclude SARS-Cov-2 infection and should not be used as the sole basis for treatment or other patient management decisions. A negative result may occur with  improper specimen collection/handling, submission of specimen other than  nasopharyngeal swab, presence of viral mutation(s) within the areas targeted by this assay, and inadequate number of viral copies(<138 copies/mL). A negative result must be combined with clinical observations, patient history, and epidemiological information. The expected result is Negative.  Fact Sheet for Patients:  EntrepreneurPulse.com.au  Fact Sheet for Healthcare Providers:  IncredibleEmployment.be  This test is no t yet approved or cleared by the [place] FDA and  has been authorized for detection and/or diagnosis of SARS-CoV-2 by FDA under an Emergency Use Authorization (EUA). This EUA will remain  in effect (meaning this test can be used) for the duration of the COVID-19 declaration under Section 564(b)(1) of the Act, 21 U.S.C.section 360bbb-3(b)(1), unless the authorization is terminated  or revoked sooner.       Influenza A by PCR NEGATIVE NEGATIVE Final   Influenza B by PCR NEGATIVE NEGATIVE Final    Comment: (  NOTE) The Xpert Xpress SARS-CoV-2/FLU/RSV plus assay is intended as an aid in the diagnosis of influenza from Nasopharyngeal swab specimens and should not be used as a sole basis for treatment. Nasal washings and aspirates are unacceptable for Xpert Xpress SARS-CoV-2/FLU/RSV testing.  Fact Sheet for Patients: EntrepreneurPulse.com.au  Fact Sheet for Healthcare Providers: IncredibleEmployment.be  This test is not yet approved or cleared by the [place] FDA and has been authorized for detection and/or diagnosis of SARS-CoV-2 by FDA under an Emergency Use Authorization (EUA). This EUA will remain in effect (meaning this test can be used) for the duration of the COVID-19 declaration under Section 564(b)(1) of the Act, 21 U.S.C. section 360bbb-3(b)(1), unless the authorization is terminated or revoked.  Performed at [facility], [address] [place], [place]  [address]   Gastrointestinal Panel by PCR , Stool     Status: None   Collection Time: 12/31/21  3:00 PM   Specimen: Stool  Result Value Ref Range Status   Campylobacter species NOT DETECTED NOT DETECTED Final   Plesimonas shigelloides NOT DETECTED NOT DETECTED Final   Salmonella species NOT DETECTED NOT DETECTED Final   Yersinia enterocolitica NOT DETECTED NOT DETECTED Final   Vibrio species NOT DETECTED NOT DETECTED Final   Vibrio cholerae NOT DETECTED NOT DETECTED Final   Enteroaggregative E coli (EAEC) NOT DETECTED NOT DETECTED Final   Enteropathogenic E coli (EPEC) NOT DETECTED NOT DETECTED Final   Enterotoxigenic E coli (ETEC) NOT DETECTED NOT DETECTED Final   Shiga like toxin producing E coli (STEC) NOT DETECTED NOT DETECTED Final   Shigella/Enteroinvasive E coli (EIEC) NOT DETECTED NOT DETECTED Final   Cryptosporidium NOT DETECTED NOT DETECTED Final   Cyclospora cayetanensis NOT DETECTED NOT DETECTED Final   Entamoeba histolytica NOT DETECTED NOT DETECTED Final   Giardia lamblia NOT DETECTED NOT DETECTED Final   Adenovirus F40/41 NOT DETECTED NOT DETECTED Final   Astrovirus NOT DETECTED NOT DETECTED Final   Norovirus GI/GII NOT DETECTED NOT DETECTED Final   Rotavirus A NOT DETECTED NOT DETECTED Final   Sapovirus (I, II, IV, and V) NOT DETECTED NOT DETECTED Final    Comment: Performed at [facility], [address] [place], [place] [address]  CULTURE, BLOOD (ROUTINE X 2) w Reflex to ID Panel     Status: None (Preliminary result)   Collection Time: 12/31/21  4:31 PM   Specimen: BLOOD  Result Value Ref Range Status   Specimen Description BLOOD BLOOD LEFT WRIST  Final   Special Requests   Final    BOTTLES DRAWN AEROBIC AND ANAEROBIC Blood Culture adequate volume   Culture   Final    NO GROWTH 2 DAYS Performed at [facility], [address] [place], [place] [address]    Report Status PENDING  Incomplete  CULTURE, BLOOD (ROUTINE X 2) w Reflex to ID Panel      Status: None (Preliminary result)   Collection Time: 12/31/21  4:31 PM   Specimen: BLOOD  Result Value Ref Range Status   Specimen Description BLOOD RIGHT ANTECUBITAL  Final   Special Requests   Final    BOTTLES DRAWN AEROBIC AND ANAEROBIC Blood Culture adequate volume   Culture   Final    NO GROWTH 2 DAYS Performed at [facility], [address], [place],  [address]    Report Status PENDING  Incomplete         Radiology Studies: CT Angio Abd/Pel W and/or Wo Contrast  Result Date: 12/31/2021  CLINICAL DATA:  Concern for mesenteric ischemia. EXAM: CTA ABDOMEN AND PELVIS WITHOUT AND WITH CONTRAST TECHNIQUE: Multidetector CT imaging of the abdomen and pelvis was performed using the standard protocol during bolus administration of intravenous contrast. Multiplanar reconstructed images and MIPs were obtained and reviewed to evaluate the vascular anatomy. CONTRAST:  130mL OMNIPAQUE IOHEXOL 350 MG/ML SOLN COMPARISON:  None. FINDINGS: VASCULAR Aorta: There is a large amount of irregular mixed calcified though predominantly noncalcified atherosclerotic plaque throughout the normal caliber abdominal aorta, not definitively resulting in a hemodynamically significant stenosis. No abdominal aortic dissection or periaortic stranding. Celiac: There is a minimal amount of mixed calcified and noncalcified atherosclerotic plaque involving the origin the celiac artery, not definitively resulting in a hemodynamically significant stenosis. Conventional branching pattern. SMA: Widely patent without a hemodynamically significant narrowing. Conventional branching pattern. The distal tributaries of the SMA appear widely patent without discrete lumen filling defect to suggest distal embolism. Renals: Duplicated bilaterally; there are tiny accessory bilateral renal arteries which supply the superior poles of the bilateral kidneys. Both dominant renal arteries are widely patent without hemodynamically  significant narrowing. No vessel irregularity to suggest FMD. 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 Inflow: There is a minimal amount of eccentric mixed calcified and noncalcified atherosclerotic plaque involving the bilateral common and external iliac arteries, not definitely resulting in a hemodynamically significant stenosis. The bilateral internal iliac arteries are diseased though patent and of normal caliber. Proximal Outflow: There is a minimal amount of mixed calcified and noncalcified atherosclerotic plaque involving the bilateral common femoral arteries, not resulting in hemodynamically significant stenosis. The imaged portions of the bilateral deep and superficial femoral arteries are of normal caliber and widely patent without a hemodynamically significant narrowing. Veins: The IVC and pelvic venous systems appear patent. Review of the MIP images confirms the above findings. _________________________________________________________ NON-VASCULAR Lower chest: Limited visualization of the lower thorax is negative for focal airspace opacity or pleural effusion. Normal heart size.  No pericardial effusion. Hepatobiliary: There is diffuse decreased attenuation of the hepatic parenchyma as could be seen in the setting of hepatic steatosis. Mild nodularity of the hepatic contour (representative image 61, series 6). No discrete hepatic lesions. Normal appearance of the gallbladder given degree distention. No radiopaque gallstones. No intra or extrahepatic biliary duct dilatation. No ascites. Pancreas: Normal appearance of the pancreas. Spleen: Normal appearance of the spleen. Adrenals/Urinary Tract: There is symmetric enhancement of the bilateral kidneys. No evidence of nephrolithiasis on this postcontrast examination. No discrete renal lesions. There is a minimal amount of grossly symmetric likely age and body habitus related perinephric  stranding. No urinary obstruction. Normal appearance the bilateral adrenal glands. Normal appearance of the urinary bladder given degree of distention. Stomach/Bowel: There is circumferential wall thickening involving the splenic flexure of the colon (axial images 69 through 103, series 6 coronal images 35 through 61, series 9). Moderate colonic stool burden without evidence of enteric obstruction. Normal appearance of the terminal ileum and appendix. No significant hiatal hernia. No pneumoperitoneum, pneumatosis or portal venous gas. Lymphatic: No bulky retroperitoneal, mesenteric, pelvic or inguinal lymphadenopathy. Reproductive: Post hysterectomy.  No discrete adnexal lesions. Other: Well-healed vertically oriented midline abdominal incision without hernia. Musculoskeletal: No acute or aggressive osseous abnormalities. Post right total hip replacement without evidence of hardware failure or loosening within is imaged components. Mild-to-moderate multilevel lumbar spine DDD, worse at L3-L4 and L5-S1 with disc space height loss, endplate irregularity and sclerosis. IMPRESSION: VASCULAR 1. Large  amount of atherosclerotic plaque within normal caliber abdominal aorta, not resulting in a hemodynamically significant stenosis. Aortic Atherosclerosis (ICD10-I70.0). 2. The major branch vessels of the abdominal aorta appear widely patent without evidence of a hemodynamically significant narrowing. NON-VASCULAR 1. Short-segment circumferential wall thickening involving the splenic flexure of the colon, not resulting in enteric obstruction. Findings are nonspecific though could be seen in the setting of a localized enteritis. No evidence of perforation or definable/drainable fluid collection. No pneumatosis or portal venous gas. 2. Suspected hepatic steatosis with mild nodularity of the hepatic contour, as could be seen in the setting of early cirrhotic change. Correlation with LFTs is advised. Electronically Signed   By:  Sandi Mariscal M.D.   On: 12/31/2021 14:57        Scheduled Meds:  budesonide (PULMICORT) nebulizer solution  0.25 mg Nebulization BID   diltiazem  180 mg Oral Daily   DULoxetine  60 mg Oral Daily   DULoxetine  60 mg Oral Once   ezetimibe  10 mg Oral Daily   ipratropium-albuterol  3 mL Nebulization Q6H   irbesartan  37.5 mg Oral Daily   metoprolol tartrate  25 mg Oral BID   pantoprazole (PROTONIX) IV  40 mg Intravenous BID   Continuous Infusions:  lactated ringers Stopped (01/02/22 0147)   piperacillin-tazobactam (ZOSYN)  IV 3.375 g (01/02/22 0149)     LOS: 2 days    Time spent: 35 minutes    Barb Merino, MD [facility] Pager [phone]

## 2022-01-03 LAB — CBC WITH DIFFERENTIAL/PLATELET
Abs Immature Granulocytes: 0.06 10*3/uL (ref 0.00–0.07)
Basophils Absolute: 0.1 10*3/uL (ref 0.0–0.1)
Basophils Relative: 1 %
Eosinophils Absolute: 0.4 10*3/uL (ref 0.0–0.5)
Eosinophils Relative: 3 %
HCT: 38.9 % (ref 36.0–46.0)
Hemoglobin: 13 g/dL (ref 12.0–15.0)
Immature Granulocytes: 1 %
Lymphocytes Relative: 23 %
Lymphs Abs: 3 10*3/uL (ref 0.7–4.0)
MCH: 31.3 pg (ref 26.0–34.0)
MCHC: 33.4 g/dL (ref 30.0–36.0)
MCV: 93.5 fL (ref 80.0–100.0)
Monocytes Absolute: 1 10*3/uL (ref 0.1–1.0)
Monocytes Relative: 7 %
Neutro Abs: 8.5 10*3/uL — ABNORMAL HIGH (ref 1.7–7.7)
Neutrophils Relative %: 65 %
Platelets: 194 10*3/uL (ref 150–400)
RBC: 4.16 MIL/uL (ref 3.87–5.11)
RDW: 13.2 % (ref 11.5–15.5)
WBC: 12.9 10*3/uL — ABNORMAL HIGH (ref 4.0–10.5)
nRBC: 0 % (ref 0.0–0.2)

## 2022-01-03 MED ORDER — AMOXICILLIN-POT CLAVULANATE 875-125 MG PO TABS
1.0000 | ORAL_TABLET | Freq: Two times a day (BID) | ORAL | 0 refills | Status: AC
Start: 2022-01-03 — End: 2022-01-13

## 2022-01-03 NOTE — Discharge Summary (Signed)
Physician Discharge Summary  LUANNE KRZYZANOWSKI EZM:[id number] DOB: 1951/06/27 DOA: 12/31/2021  PCP: Derinda Late, MD  Admit date: 12/31/2021 Discharge date: 01/03/2022  Admitted From: Home Disposition: Home  Recommendations for Outpatient Follow-up:  Follow up with PCP in 1-2 weeks CBC in 1 week. Call and schedule follow-up with gastroenterology in 2 to 4 weeks.  Home Health: N/A Equipment/Devices: N/A  Discharge Condition: Stable CODE STATUS: Full code Diet recommendation: Low-salt diet, avoid constipation.  Discharge summary: 71 year old with history of paroxysmal A. fib and currently on aspirin only, was on Eliquis until July 2022, COPD, GERD, hypertension hyperlipidemia and hard of hearing presented with abdominal pain, diarrhea and bright red blood per rectum.  In the emergency room, hemodynamically stable. Chest x-ray normal.  CT of the abdomen pelvis with localized inflammation at splenic flexure of the colon.  Admitted and treated as acute diverticulitis and diverticular bleed.  Found to be in rapid A. Fib after missing medication doses at home.    Acute lower GI bleeding secondary to splenic flexure diverticulitis: Bloody diarrhea. GI pathogen panel negative.  Stool not consistent to check for C. difficile. Treated with IV Zosyn with very good response and clinical improvement. Recent colonoscopy with extensive diverticulosis.  Will discharge patient to home with 10 more days of oral Augmentin.  She will schedule follow-up with gastroenterology.  Anticipate conservative management. Hemoglobin remained stable with no evidence of ongoing blood loss.  Can resume aspirin.  A. fib with RVR: Known paroxysmal A. fib.  Improved after resuming Cardizem and metoprolol from home. Sinus rhythm today.  Cannot be anticoagulated due to ongoing GI bleeding.  COPD/GERD/essential hypertension: Fairly stable on home medications.   Hypokalemia: Replaced and adequate.  Patient with improved  symptoms today.  She is able to go home.     Discharge Diagnoses:  Principal Problem:   Acute GI bleeding Active Problems:   Atrial fibrillation with RVR (HCC)   Colitis   COPD, mild (HCC)   GERD without esophagitis   GI bleeding    Discharge Instructions  Discharge Instructions     Call MD for:  persistant nausea and vomiting   Complete by: As directed    Call MD for:  severe uncontrolled pain   Complete by: As directed    Diet - low sodium heart healthy   Complete by: As directed    Discharge instructions   Complete by: As directed    Call and schedule follow up with your GI doctor   Increase activity slowly   Complete by: As directed       Allergies as of 01/03/2022       Reactions   Bupropion Other (See Comments)   Bad Dreams, Depression, Confusion   Gabapentin    Other reaction(s): Other (See Comments) Confusion   Simvastatin Other (See Comments)   Muscle pain        Medication List     STOP taking these medications    fluticasone 110 MCG/ACT inhaler Commonly known as: FLOVENT HFA   ipratropium-albuterol 0.5-2.5 (3) MG/3ML Soln Commonly known as: DUONEB   lidocaine 5 % Commonly known as: Lidoderm   traMADol 50 MG tablet Commonly known as: ULTRAM   valsartan-hydrochlorothiazide 80-12.5 MG tablet Commonly known as: DIOVAN-HCT       TAKE these medications    acetaminophen 500 MG tablet Commonly known as: TYLENOL Take 500 mg by mouth every 6 (six) hours as needed for moderate pain.   amoxicillin-clavulanate 875-125 MG tablet Commonly known as:  Augmentin Take 1 tablet by mouth every 12 (twelve) hours for 10 days.   diltiazem 180 MG 24 hr capsule Commonly known as: CARDIZEM CD Take by mouth daily.   diltiazem 30 MG tablet Commonly known as: CARDIZEM Take 30 mg by mouth daily as needed (heart rate/pulse increase to greater than 100.).   docusate sodium 100 MG capsule Commonly known as: COLACE Take 1 capsule (100 mg total) by mouth  2 (two) times daily.   DULoxetine 60 MG capsule Commonly known as: CYMBALTA Take 60 mg by mouth daily.   ezetimibe 10 MG tablet Commonly known as: ZETIA Take 10 mg by mouth daily.   fluticasone 50 MCG/ACT nasal spray Commonly known as: FLONASE Place 1 spray into both nostrils daily as needed for allergies or rhinitis.   LORazepam 0.5 MG tablet Commonly known as: ATIVAN Take 0.5 mg by mouth 2 (two) times daily as needed for anxiety.   metoprolol tartrate 25 MG tablet Commonly known as: LOPRESSOR Take 25 mg by mouth 2 (two) times daily.   omeprazole 40 MG capsule Commonly known as: PRILOSEC Take 40 mg by mouth daily.        Follow-up Information     Derinda Late, MD Follow up in 2 week(s).   Specialty: Family Medicine Why: Patient must call and make appointment for on or around the date of 01/17/22 Contact information: [address] [facility] [place] [address] [phone]                Allergies  Allergen Reactions   Bupropion Other (See Comments)    Bad Dreams, Depression, Confusion   Gabapentin     Other reaction(s): Other (See Comments) Confusion   Simvastatin Other (See Comments)    Muscle pain    Consultations: None   Procedures/Studies: DG Chest 2 View  Result Date: 12/31/2021 CLINICAL DATA:  Chest pain.  Near syncope. EXAM: CHEST - 2 VIEW COMPARISON:  05/21/2020 FINDINGS: The cardiomediastinal silhouette is unchanged with normal heart size. Aortic atherosclerosis is noted. Chronic coarsening of the interstitial markings is again noted. No acute airspace consolidation, overt pulmonary edema, pleural effusion, or pneumothorax is identified. No acute osseous abnormality is seen. IMPRESSION: No active cardiopulmonary disease. Electronically Signed   By: Logan Bores M.D.   On: 12/31/2021 11:06   CT Angio Abd/Pel W and/or Wo Contrast  Result Date: 12/31/2021 CLINICAL DATA:  Concern for mesenteric  ischemia. EXAM: CTA ABDOMEN AND PELVIS WITHOUT AND WITH CONTRAST TECHNIQUE: Multidetector CT imaging of the abdomen and pelvis was performed using the standard protocol during bolus administration of intravenous contrast. Multiplanar reconstructed images and MIPs were obtained and reviewed to evaluate the vascular anatomy. CONTRAST:  132mL OMNIPAQUE IOHEXOL 350 MG/ML SOLN COMPARISON:  None. FINDINGS: VASCULAR Aorta: There is a large amount of irregular mixed calcified though predominantly noncalcified atherosclerotic plaque throughout the normal caliber abdominal aorta, not definitively resulting in a hemodynamically significant stenosis. No abdominal aortic dissection or periaortic stranding. Celiac: There is a minimal amount of mixed calcified and noncalcified atherosclerotic plaque involving the origin the celiac artery, not definitively resulting in a hemodynamically significant stenosis. Conventional branching pattern. SMA: Widely patent without a hemodynamically significant narrowing. Conventional branching pattern. The distal tributaries of the SMA appear widely patent without discrete lumen filling defect to suggest distal embolism. Renals: Duplicated bilaterally; there are tiny accessory bilateral renal arteries which supply the superior poles of the bilateral kidneys. Both dominant renal arteries are widely  patent without hemodynamically significant narrowing. No vessel irregularity to suggest FMD. 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 Inflow: There is a minimal amount of eccentric mixed calcified and noncalcified atherosclerotic plaque involving the bilateral common and external iliac arteries, not definitely resulting in a hemodynamically significant stenosis. The bilateral internal iliac arteries are diseased though patent and of normal caliber. Proximal Outflow: There is a minimal amount of mixed calcified and noncalcified  atherosclerotic plaque involving the bilateral common femoral arteries, not resulting in hemodynamically significant stenosis. The imaged portions of the bilateral deep and superficial femoral arteries are of normal caliber and widely patent without a hemodynamically significant narrowing. Veins: The IVC and pelvic venous systems appear patent. Review of the MIP images confirms the above findings. _________________________________________________________ NON-VASCULAR Lower chest: Limited visualization of the lower thorax is negative for focal airspace opacity or pleural effusion. Normal heart size.  No pericardial effusion. Hepatobiliary: There is diffuse decreased attenuation of the hepatic parenchyma as could be seen in the setting of hepatic steatosis. Mild nodularity of the hepatic contour (representative image 61, series 6). No discrete hepatic lesions. Normal appearance of the gallbladder given degree distention. No radiopaque gallstones. No intra or extrahepatic biliary duct dilatation. No ascites. Pancreas: Normal appearance of the pancreas. Spleen: Normal appearance of the spleen. Adrenals/Urinary Tract: There is symmetric enhancement of the bilateral kidneys. No evidence of nephrolithiasis on this postcontrast examination. No discrete renal lesions. There is a minimal amount of grossly symmetric likely age and body habitus related perinephric stranding. No urinary obstruction. Normal appearance the bilateral adrenal glands. Normal appearance of the urinary bladder given degree of distention. Stomach/Bowel: There is circumferential wall thickening involving the splenic flexure of the colon (axial images 69 through 103, series 6 coronal images 35 through 61, series 9). Moderate colonic stool burden without evidence of enteric obstruction. Normal appearance of the terminal ileum and appendix. No significant hiatal hernia. No pneumoperitoneum, pneumatosis or portal venous gas. Lymphatic: No bulky  retroperitoneal, mesenteric, pelvic or inguinal lymphadenopathy. Reproductive: Post hysterectomy.  No discrete adnexal lesions. Other: Well-healed vertically oriented midline abdominal incision without hernia. Musculoskeletal: No acute or aggressive osseous abnormalities. Post right total hip replacement without evidence of hardware failure or loosening within is imaged components. Mild-to-moderate multilevel lumbar spine DDD, worse at L3-L4 and L5-S1 with disc space height loss, endplate irregularity and sclerosis. IMPRESSION: VASCULAR 1. Large amount of atherosclerotic plaque within normal caliber abdominal aorta, not resulting in a hemodynamically significant stenosis. Aortic Atherosclerosis (ICD10-I70.0). 2. The major branch vessels of the abdominal aorta appear widely patent without evidence of a hemodynamically significant narrowing. NON-VASCULAR 1. Short-segment circumferential wall thickening involving the splenic flexure of the colon, not resulting in enteric obstruction. Findings are nonspecific though could be seen in the setting of a localized enteritis. No evidence of perforation or definable/drainable fluid collection. No pneumatosis or portal venous gas. 2. Suspected hepatic steatosis with mild nodularity of the hepatic contour, as could be seen in the setting of early cirrhotic change. Correlation with LFTs is advised. Electronically Signed   By: Sandi Mariscal M.D.   On: 12/31/2021 14:57   (Echo, Carotid, EGD, Colonoscopy, ERCP)    Subjective: Patient seen and examined.  No overnight events.  She had 2 small blood and mucoid stools since yesterday.  Eager to go home.  Denies any nausea or vomiting.  Ambulating around the hallway.   Discharge Exam: Vitals:   01/03/22 0918 01/03/22 0920  BP:  123/71 123/71  Pulse: 70 68  Resp:  16  Temp:  98.2 F (36.8 C)  SpO2:  95%   Vitals:   01/03/22 0307 01/03/22 0628 01/03/22 0918 01/03/22 0920  BP: 131/75  123/71 123/71  Pulse: 61  70 68  Resp:  18   16  Temp: 97.8 F (36.6 C)   98.2 F (36.8 C)  TempSrc:      SpO2: 96%   95%  Weight:  83.8 kg    Height:        General: Pt is alert, awake, not in acute distress Cardiovascular: RRR, S1/S2 +, no rubs, no gallops Respiratory: CTA bilaterally, no wheezing, no rhonchi Abdominal: Soft, NT, ND, bowel sounds + Extremities: no edema, no cyanosis    The results of significant diagnostics from this hospitalization (including imaging, microbiology, ancillary and laboratory) are listed below for reference.     Microbiology: Recent Results (from the past 240 hour(s))  Resp Panel by RT-PCR (Flu A&B, Covid) Nasopharyngeal Swab     Status: None   Collection Time: 12/31/21 10:34 AM   Specimen: Nasopharyngeal Swab; Nasopharyngeal(NP) swabs in vial transport medium  Result Value Ref Range Status   SARS Coronavirus 2 by RT PCR NEGATIVE NEGATIVE Final    Comment: (NOTE) SARS-CoV-2 target nucleic acids are NOT DETECTED.  The SARS-CoV-2 RNA is generally detectable in upper respiratory specimens during the acute phase of infection. The lowest concentration of SARS-CoV-2 viral copies this assay can detect is 138 copies/mL. A negative result does not preclude SARS-Cov-2 infection and should not be used as the sole basis for treatment or other patient management decisions. A negative result may occur with  improper specimen collection/handling, submission of specimen other than nasopharyngeal swab, presence of viral mutation(s) within the areas targeted by this assay, and inadequate number of viral copies(<138 copies/mL). A negative result must be combined with clinical observations, patient history, and epidemiological information. The expected result is Negative.  Fact Sheet for Patients:  EntrepreneurPulse.com.au  Fact Sheet for Healthcare Providers:  IncredibleEmployment.be  This test is no t yet approved or cleared by the [place] FDA and   has been authorized for detection and/or diagnosis of SARS-CoV-2 by FDA under an Emergency Use Authorization (EUA). This EUA will remain  in effect (meaning this test can be used) for the duration of the COVID-19 declaration under Section 564(b)(1) of the Act, 21 U.S.C.section 360bbb-3(b)(1), unless the authorization is terminated  or revoked sooner.       Influenza A by PCR NEGATIVE NEGATIVE Final   Influenza B by PCR NEGATIVE NEGATIVE Final    Comment: (NOTE) The Xpert Xpress SARS-CoV-2/FLU/RSV plus assay is intended as an aid in the diagnosis of influenza from Nasopharyngeal swab specimens and should not be used as a sole basis for treatment. Nasal washings and aspirates are unacceptable for Xpert Xpress SARS-CoV-2/FLU/RSV testing.  Fact Sheet for Patients: EntrepreneurPulse.com.au  Fact Sheet for Healthcare Providers: IncredibleEmployment.be  This test is not yet approved or cleared by the [place] FDA and has been authorized for detection and/or diagnosis of SARS-CoV-2 by FDA under an Emergency Use Authorization (EUA). This EUA will remain in effect (meaning this test can be used) for the duration of the COVID-19 declaration under Section 564(b)(1) of the Act, 21 U.S.C. section 360bbb-3(b)(1), unless the authorization is terminated or revoked.  Performed at [facility], [address] [place], [place] [address]   Gastrointestinal Panel by PCR , Stool     Status: None  Collection Time: 12/31/21  3:00 PM   Specimen: Stool  Result Value Ref Range Status   Campylobacter species NOT DETECTED NOT DETECTED Final   Plesimonas shigelloides NOT DETECTED NOT DETECTED Final   Salmonella species NOT DETECTED NOT DETECTED Final   Yersinia enterocolitica NOT DETECTED NOT DETECTED Final   Vibrio species NOT DETECTED NOT DETECTED Final   Vibrio cholerae NOT DETECTED NOT DETECTED Final   Enteroaggregative E coli (EAEC) NOT  DETECTED NOT DETECTED Final   Enteropathogenic E coli (EPEC) NOT DETECTED NOT DETECTED Final   Enterotoxigenic E coli (ETEC) NOT DETECTED NOT DETECTED Final   Shiga like toxin producing E coli (STEC) NOT DETECTED NOT DETECTED Final   Shigella/Enteroinvasive E coli (EIEC) NOT DETECTED NOT DETECTED Final   Cryptosporidium NOT DETECTED NOT DETECTED Final   Cyclospora cayetanensis NOT DETECTED NOT DETECTED Final   Entamoeba histolytica NOT DETECTED NOT DETECTED Final   Giardia lamblia NOT DETECTED NOT DETECTED Final   Adenovirus F40/41 NOT DETECTED NOT DETECTED Final   Astrovirus NOT DETECTED NOT DETECTED Final   Norovirus GI/GII NOT DETECTED NOT DETECTED Final   Rotavirus A NOT DETECTED NOT DETECTED Final   Sapovirus (I, II, IV, and V) NOT DETECTED NOT DETECTED Final    Comment: Performed at [facility], [address] [place], [place] [address]  CULTURE, BLOOD (ROUTINE X 2) w Reflex to ID Panel     Status: None (Preliminary result)   Collection Time: 12/31/21  4:31 PM   Specimen: BLOOD  Result Value Ref Range Status   Specimen Description BLOOD BLOOD LEFT WRIST  Final   Special Requests   Final    BOTTLES DRAWN AEROBIC AND ANAEROBIC Blood Culture adequate volume   Culture   Final    NO GROWTH 3 DAYS Performed at [facility], [address] [place], [place] [address]    Report Status PENDING  Incomplete  CULTURE, BLOOD (ROUTINE X 2) w Reflex to ID Panel     Status: None (Preliminary result)   Collection Time: 12/31/21  4:31 PM   Specimen: BLOOD  Result Value Ref Range Status   Specimen Description BLOOD RIGHT ANTECUBITAL  Final   Special Requests   Final    BOTTLES DRAWN AEROBIC AND ANAEROBIC Blood Culture adequate volume   Culture   Final    NO GROWTH 3 DAYS Performed at [facility], [address] [place], [place] [address]    Report Status PENDING  Incomplete     Labs: BNP (last 3 results) No results for input(s): BNP in the last 8760  hours. Basic Metabolic Panel: Recent Labs  Lab 12/31/21 1033 12/31/21 1631 01/01/22 0500 01/02/22 0619  NA 136  --  141 140  K 4.1  --  3.2* 3.9  CL 100  --  104 106  CO2 24  --  28 27  GLUCOSE 111*  --  120* 128*  BUN 9  --  6* 6*  CREATININE 0.57  --  0.63 0.79  CALCIUM 8.8*  --  8.4* 8.4*  MG  --  2.2  --  1.9   Liver Function Tests: Recent Labs  Lab 12/31/21 1428  AST 20  ALT 20  ALKPHOS 112  BILITOT 0.6  PROT 7.1  ALBUMIN 4.2   No results for input(s): LIPASE, AMYLASE in the last 168 hours. No results for input(s): AMMONIA in the last 168 hours. CBC: Recent Labs  Lab 01/01/22 0500 01/01/22 1712 01/02/22 0619 01/02/22 1659 01/03/22  0435  WBC 12.7* 14.6* 13.6* 14.4* 12.9*  NEUTROABS  --  10.1* 9.5* 9.6* 8.5*  HGB 14.2 14.1 13.3 13.0 13.0  HCT 43.6 41.7 40.7 38.9 38.9  MCV 93.0 93.7 95.3 94.4 93.5  PLT 214 212 183 194 194   Cardiac Enzymes: No results for input(s): CKTOTAL, CKMB, CKMBINDEX, TROPONINI in the last 168 hours. BNP: Invalid input(s): POCBNP CBG: No results for input(s): GLUCAP in the last 168 hours. D-Dimer No results for input(s): DDIMER in the last 72 hours. Hgb A1c No results for input(s): HGBA1C in the last 72 hours. Lipid Profile No results for input(s): CHOL, HDL, LDLCALC, TRIG, CHOLHDL, LDLDIRECT in the last 72 hours. Thyroid function studies No results for input(s): TSH, T4TOTAL, T3FREE, THYROIDAB in the last 72 hours.  Invalid input(s): FREET3 Anemia work up No results for input(s): VITAMINB12, FOLATE, FERRITIN, TIBC, IRON, RETICCTPCT in the last 72 hours. Urinalysis    Component Value Date/Time   COLORURINE YELLOW (A) 07/05/2020 1028   APPEARANCEUR HAZY (A) 07/05/2020 1028   APPEARANCEUR Hazy 02/26/2014 1833   LABSPEC 1.011 07/05/2020 1028   LABSPEC 1.019 02/26/2014 1833   PHURINE 6.0 07/05/2020 1028   GLUCOSEU NEGATIVE 07/05/2020 1028   GLUCOSEU Negative 02/26/2014 1833   HGBUR SMALL (A) 07/05/2020 1028   BILIRUBINUR  NEGATIVE 07/05/2020 1028   BILIRUBINUR Negative 02/26/2014 1833   KETONESUR NEGATIVE 07/05/2020 1028   PROTEINUR NEGATIVE 07/05/2020 1028   NITRITE NEGATIVE 07/05/2020 1028   LEUKOCYTESUR SMALL (A) 07/05/2020 1028   LEUKOCYTESUR 3+ 02/26/2014 1833   Sepsis Labs Invalid input(s): PROCALCITONIN,  WBC,  LACTICIDVEN Microbiology Recent Results (from the past 240 hour(s))  Resp Panel by RT-PCR (Flu A&B, Covid) Nasopharyngeal Swab     Status: None   Collection Time: 12/31/21 10:34 AM   Specimen: Nasopharyngeal Swab; Nasopharyngeal(NP) swabs in vial transport medium  Result Value Ref Range Status   SARS Coronavirus 2 by RT PCR NEGATIVE NEGATIVE Final    Comment: (NOTE) SARS-CoV-2 target nucleic acids are NOT DETECTED.  The SARS-CoV-2 RNA is generally detectable in upper respiratory specimens during the acute phase of infection. The lowest concentration of SARS-CoV-2 viral copies this assay can detect is 138 copies/mL. A negative result does not preclude SARS-Cov-2 infection and should not be used as the sole basis for treatment or other patient management decisions. A negative result may occur with  improper specimen collection/handling, submission of specimen other than nasopharyngeal swab, presence of viral mutation(s) within the areas targeted by this assay, and inadequate number of viral copies(<138 copies/mL). A negative result must be combined with clinical observations, patient history, and epidemiological information. The expected result is Negative.  Fact Sheet for Patients:  EntrepreneurPulse.com.au  Fact Sheet for Healthcare Providers:  IncredibleEmployment.be  This test is no t yet approved or cleared by the [place] FDA and  has been authorized for detection and/or diagnosis of SARS-CoV-2 by FDA under an Emergency Use Authorization (EUA). This EUA will remain  in effect (meaning this test can be used) for the duration of  the COVID-19 declaration under Section 564(b)(1) of the Act, 21 U.S.C.section 360bbb-3(b)(1), unless the authorization is terminated  or revoked sooner.       Influenza A by PCR NEGATIVE NEGATIVE Final   Influenza B by PCR NEGATIVE NEGATIVE Final    Comment: (NOTE) The Xpert Xpress SARS-CoV-2/FLU/RSV plus assay is intended as an aid in the diagnosis of influenza from Nasopharyngeal swab specimens and should not be used as a sole basis for treatment.  Nasal washings and aspirates are unacceptable for Xpert Xpress SARS-CoV-2/FLU/RSV testing.  Fact Sheet for Patients: EntrepreneurPulse.com.au  Fact Sheet for Healthcare Providers: IncredibleEmployment.be  This test is not yet approved or cleared by the [place] FDA and has been authorized for detection and/or diagnosis of SARS-CoV-2 by FDA under an Emergency Use Authorization (EUA). This EUA will remain in effect (meaning this test can be used) for the duration of the COVID-19 declaration under Section 564(b)(1) of the Act, 21 U.S.C. section 360bbb-3(b)(1), unless the authorization is terminated or revoked.  Performed at [facility], [address] [place], [place] [address]   Gastrointestinal Panel by PCR , Stool     Status: None   Collection Time: 12/31/21  3:00 PM   Specimen: Stool  Result Value Ref Range Status   Campylobacter species NOT DETECTED NOT DETECTED Final   Plesimonas shigelloides NOT DETECTED NOT DETECTED Final   Salmonella species NOT DETECTED NOT DETECTED Final   Yersinia enterocolitica NOT DETECTED NOT DETECTED Final   Vibrio species NOT DETECTED NOT DETECTED Final   Vibrio cholerae NOT DETECTED NOT DETECTED Final   Enteroaggregative E coli (EAEC) NOT DETECTED NOT DETECTED Final   Enteropathogenic E coli (EPEC) NOT DETECTED NOT DETECTED Final   Enterotoxigenic E coli (ETEC) NOT DETECTED NOT DETECTED Final   Shiga like toxin producing E coli (STEC) NOT  DETECTED NOT DETECTED Final   Shigella/Enteroinvasive E coli (EIEC) NOT DETECTED NOT DETECTED Final   Cryptosporidium NOT DETECTED NOT DETECTED Final   Cyclospora cayetanensis NOT DETECTED NOT DETECTED Final   Entamoeba histolytica NOT DETECTED NOT DETECTED Final   Giardia lamblia NOT DETECTED NOT DETECTED Final   Adenovirus F40/41 NOT DETECTED NOT DETECTED Final   Astrovirus NOT DETECTED NOT DETECTED Final   Norovirus GI/GII NOT DETECTED NOT DETECTED Final   Rotavirus A NOT DETECTED NOT DETECTED Final   Sapovirus (I, II, IV, and V) NOT DETECTED NOT DETECTED Final    Comment: Performed at [facility], [address] [place], [place] [address]  CULTURE, BLOOD (ROUTINE X 2) w Reflex to ID Panel     Status: None (Preliminary result)   Collection Time: 12/31/21  4:31 PM   Specimen: BLOOD  Result Value Ref Range Status   Specimen Description BLOOD BLOOD LEFT WRIST  Final   Special Requests   Final    BOTTLES DRAWN AEROBIC AND ANAEROBIC Blood Culture adequate volume   Culture   Final    NO GROWTH 3 DAYS Performed at [facility], [address] [place], [place] [address]    Report Status PENDING  Incomplete  CULTURE, BLOOD (ROUTINE X 2) w Reflex to ID Panel     Status: None (Preliminary result)   Collection Time: 12/31/21  4:31 PM   Specimen: BLOOD  Result Value Ref Range Status   Specimen Description BLOOD RIGHT ANTECUBITAL  Final   Special Requests   Final    BOTTLES DRAWN AEROBIC AND ANAEROBIC Blood Culture adequate volume   Culture   Final    NO GROWTH 3 DAYS Performed at [facility], [address], [place],  [address]    Report Status PENDING  Incomplete     Time coordinating discharge:  40 minutes  SIGNED:   Barb Merino, MD  [facility] 01/03/2022, 11:41 AM

## 2022-01-05 LAB — CULTURE, BLOOD (ROUTINE X 2)
Culture: NO GROWTH
Culture: NO GROWTH
Special Requests: ADEQUATE
Special Requests: ADEQUATE

## 2022-02-20 ENCOUNTER — Other Ambulatory Visit: Payer: Self-pay | Admitting: Gastroenterology

## 2022-02-20 DIAGNOSIS — K760 Fatty (change of) liver, not elsewhere classified: Secondary | ICD-10-CM

## 2022-03-03 ENCOUNTER — Ambulatory Visit
Admission: RE | Admit: 2022-03-03 | Discharge: 2022-03-03 | Disposition: A | Discharge status: Home / Self Care | Payer: Medicare Other | Source: Ambulatory Visit | Attending: Gastroenterology | Admitting: Gastroenterology

## 2022-03-03 DIAGNOSIS — K760 Fatty (change of) liver, not elsewhere classified: Secondary | ICD-10-CM | POA: Diagnosis present

## 2022-09-15 IMAGING — US US ABDOMEN COMPLETE W/ ELASTOGRAPHY
1 series · 12 of 25 positions shown · non-contrast
Comparison: CT December 31, 2021

CLINICAL DATA: Hepatic steatosis

EXAM:
ULTRASOUND ABDOMEN
ULTRASOUND HEPATIC ELASTOGRAPHY
TECHNIQUE: Sonography of the upper abdomen was performed. In addition,
ultrasound elastography evaluation of the liver was performed. A
region of interest was placed within the right lobe of the liver.
Following application of a compressive sonographic pulse, tissue
compressibility was assessed. Multiple assessments were performed at
the selected site. Median tissue compressibility was determined.
Previously, hepatic stiffness was assessed by shear wave velocity.
Based on recently published Society of Radiologists in Ultrasound
consensus article, reporting is now recommended to be performed in
the SI units of pressure (kiloPascals) representing hepatic
stiffness/elasticity. The obtained result is compared to the
published reference standards. (cACLD = compensated Advanced Chronic
Liver Disease)

[Series 1: us abdomen complete w/elastography · 12 of 89 slices shown]
[im 4/89]
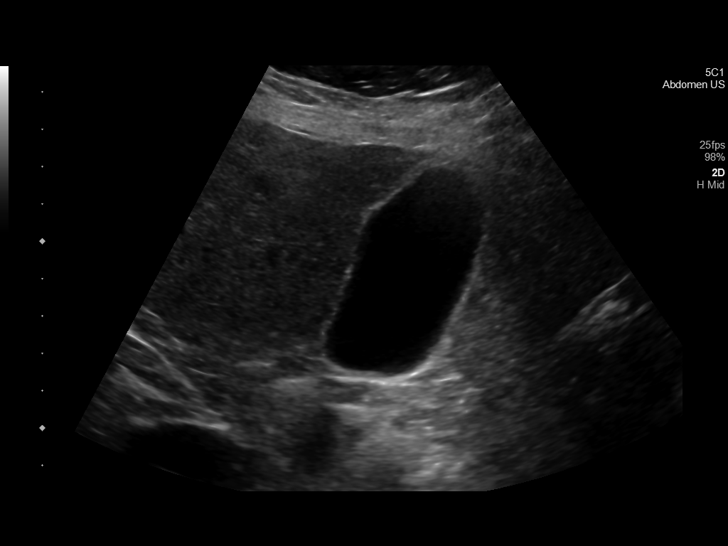
[im 12/89]
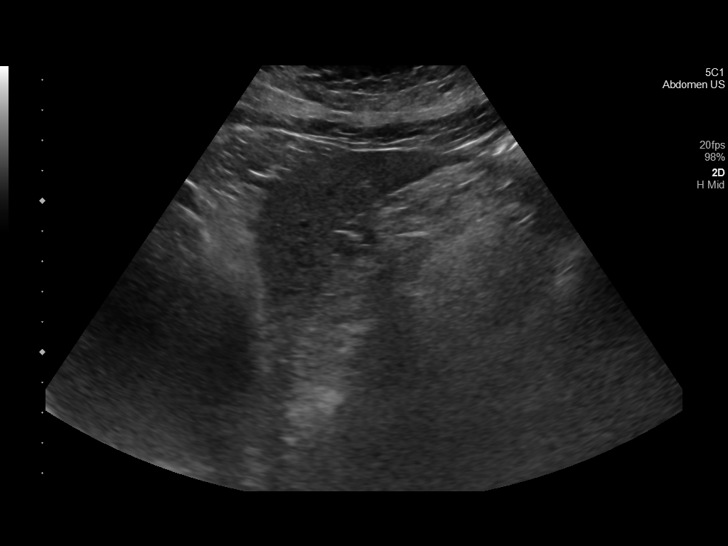
[im 19/89]
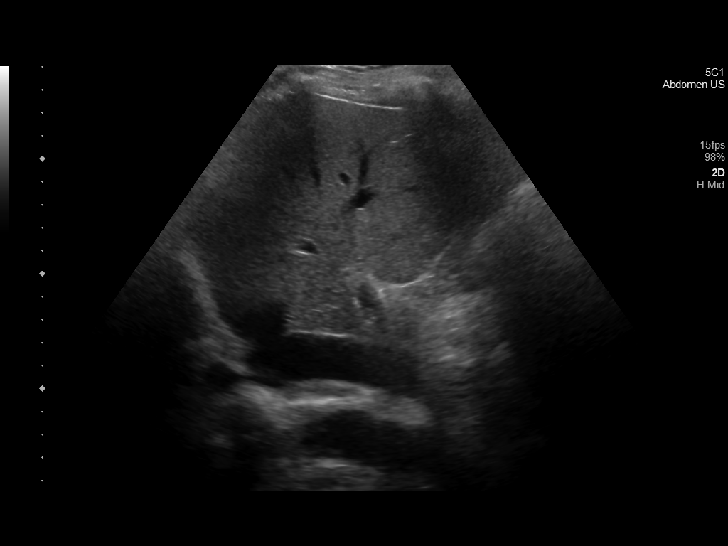
[im 26/89]
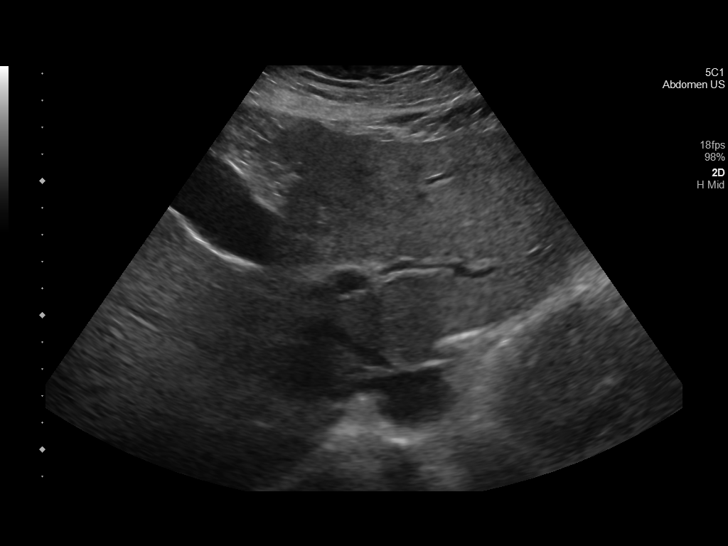
[im 34/89]
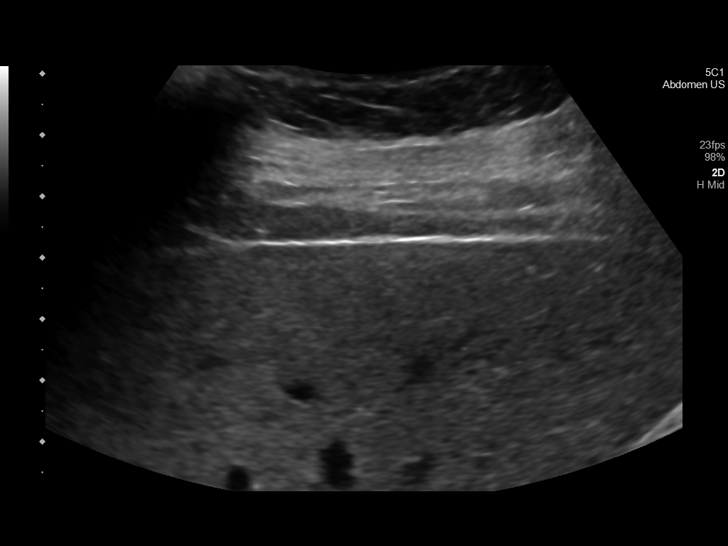
[im 41/89]
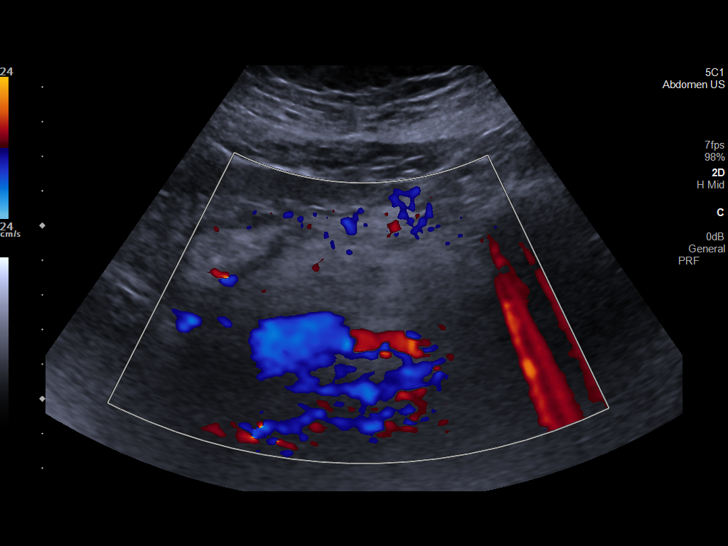
[im 48/89]
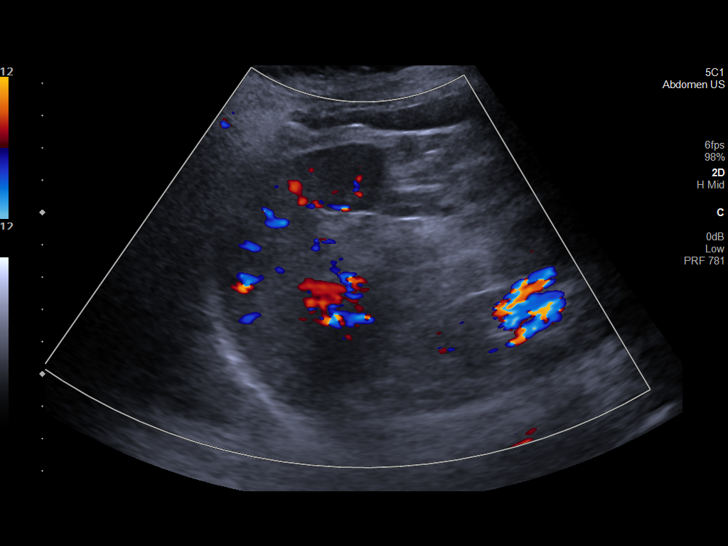
[im 56/89]
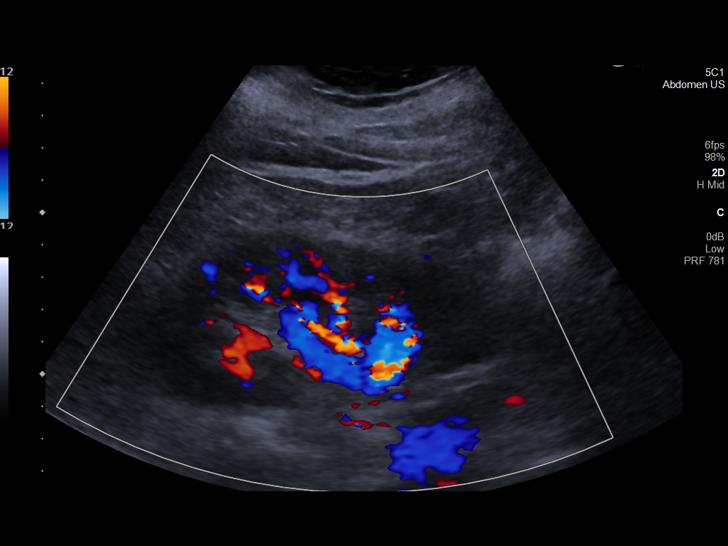
[im 63/89]
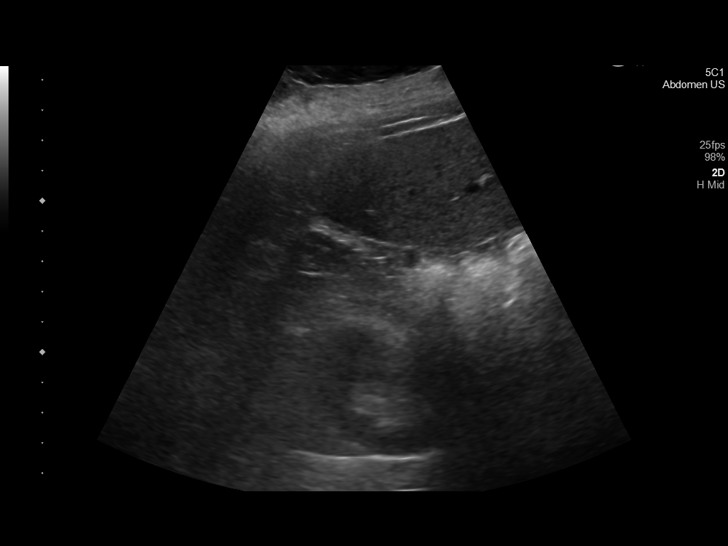
[im 70/89]
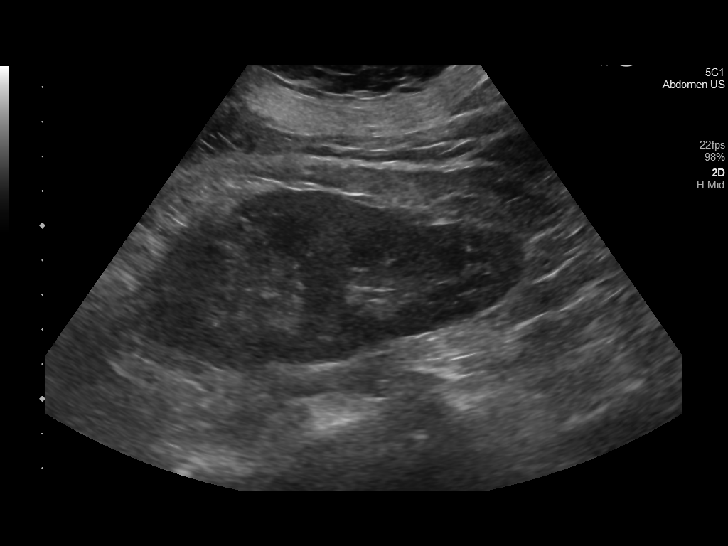
[im 78/89]
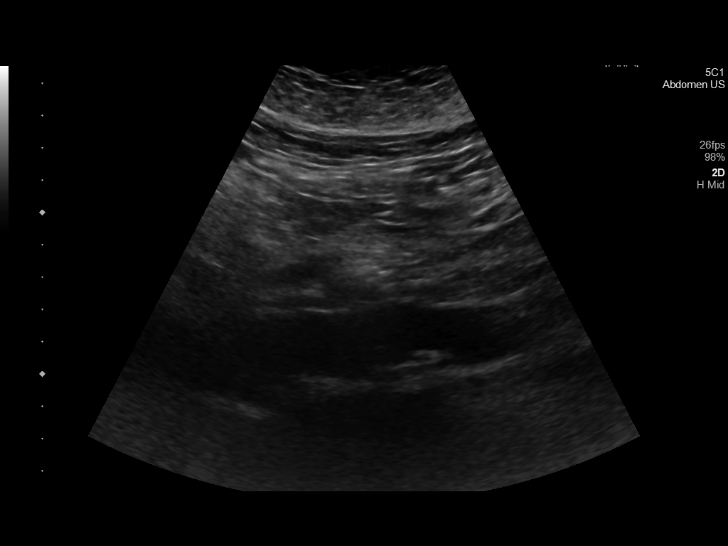
[im 85/89]
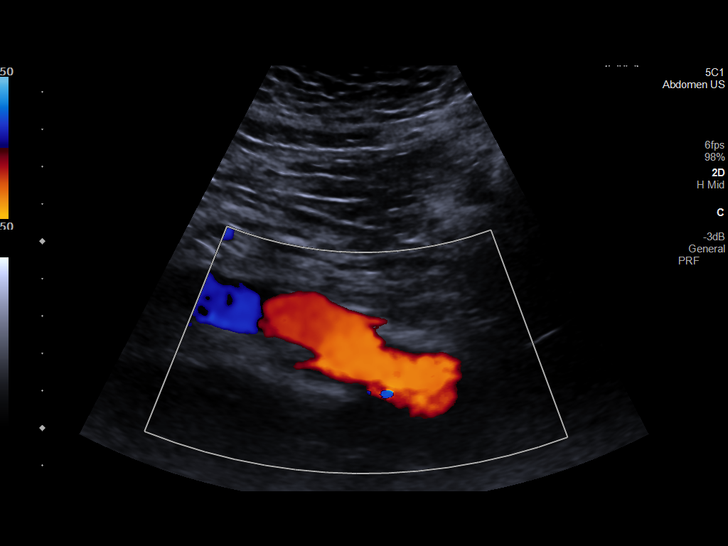

[12 of 25 positions shown; findings below may reference images not displayed]

FINDINGS: ULTRASOUND ABDOMEN

Gallbladder: No gallstones or wall thickening visualized. No
sonographic Murphy sign noted by sonographer.

Common bile duct: Diameter: 5 mm

Liver: No focal lesion identified. Diffusely increased parenchymal
echogenicity. Portal vein is patent on color Doppler imaging with
normal direction of blood flow towards the liver.

IVC: No abnormality visualized.

Pancreas: Visualized portion unremarkable.

Spleen: Size and appearance within normal limits.

Right Kidney: Length: 10.5 cm. Echogenicity within normal limits. No
mass or hydronephrosis visualized.

Left Kidney: Length: 11.1 cm. Echogenicity within normal limits. No
mass or hydronephrosis visualized.

Abdominal aorta: No aneurysm visualized.

Other findings: None.

ULTRASOUND HEPATIC ELASTOGRAPHY

Device: Siemens Helix VTQ

Patient position: Supine

Transducer 5C1

Number of measurements: 10

Hepatic segment:  8

Median kPa:

IQR:

IQR/Median kPa ratio: 0 point

Data quality:  Good

Diagnostic category:  < or = 5 kPa: high probability of being normal

The use of hepatic elastography is applicable to patients with viral
hepatitis and non-alcoholic fatty liver disease. At this time, there
is insufficient data for the referenced cut-off values and use in
other causes of liver disease, including alcoholic liver disease.
Patients, however, may be assessed by elastography and serve as
their own reference standard/baseline.

In patients with non-alcoholic liver disease, the values suggesting
compensated advanced chronic liver disease (cACLD) may be lower, and
patients may need additional testing with elasticity results of [DATE]
kPa.

Please note that abnormal hepatic elasticity and shear wave
velocities may also be identified in clinical settings other than
with hepatic fibrosis, such as: acute hepatitis, elevated right
heart and central venous pressures including use of beta blockers,
Britto disease (Rimodar), infiltrative processes such as
mastocytosis/amyloidosis/infiltrative tumor/lymphoma, extrahepatic
cholestasis, with hyperemia in the post-prandial state, and with
liver transplantation. Correlation with patient history, laboratory
data, and clinical condition recommended.

Diagnostic Categories:

< or =5 kPa: high probability of being normal

< or =9 kPa: in the absence of other known clinical signs, rules [DATE] kPa and ?13 kPa: suggestive of cACLD, but needs further testing

>13 kPa: highly suggestive of cACLD

> or =17 kPa: highly suggestive of cACLD with an increased
probability of clinically significant portal hypertension
IMPRESSION: ULTRASOUND ABDOMEN:

Diffusely increased hepatic parenchymal echogenicity, nonspecific
but most commonly reflecting hepatic steatosis.

ULTRASOUND HEPATIC ELASTOGRAPHY:

Median kPa:

Diagnostic category:  < or = 5 kPa: high probability of being normal

## 2022-10-24 ENCOUNTER — Emergency Department: Payer: Medicare Other

## 2022-10-24 ENCOUNTER — Inpatient Hospital Stay
Admission: EM | Admit: 2022-10-24 | Discharge: 2022-10-26 | DRG: 192 | Disposition: A | Discharge status: Home / Self Care | Payer: Medicare Other | Attending: Internal Medicine | Admitting: Internal Medicine

## 2022-10-24 ENCOUNTER — Encounter: Payer: Self-pay | Admitting: Emergency Medicine

## 2022-10-24 ENCOUNTER — Other Ambulatory Visit: Payer: Self-pay

## 2022-10-24 DIAGNOSIS — I480 Paroxysmal atrial fibrillation: Secondary | ICD-10-CM | POA: Diagnosis present

## 2022-10-24 DIAGNOSIS — F32A Depression, unspecified: Secondary | ICD-10-CM | POA: Diagnosis present

## 2022-10-24 DIAGNOSIS — F419 Anxiety disorder, unspecified: Secondary | ICD-10-CM | POA: Diagnosis present

## 2022-10-24 DIAGNOSIS — Z825 Family history of asthma and other chronic lower respiratory diseases: Secondary | ICD-10-CM

## 2022-10-24 DIAGNOSIS — Z888 Allergy status to other drugs, medicaments and biological substances status: Secondary | ICD-10-CM

## 2022-10-24 DIAGNOSIS — Z8616 Personal history of COVID-19: Secondary | ICD-10-CM

## 2022-10-24 DIAGNOSIS — Z79899 Other long term (current) drug therapy: Secondary | ICD-10-CM

## 2022-10-24 DIAGNOSIS — I10 Essential (primary) hypertension: Secondary | ICD-10-CM | POA: Diagnosis present

## 2022-10-24 DIAGNOSIS — I4891 Unspecified atrial fibrillation: Secondary | ICD-10-CM | POA: Diagnosis not present

## 2022-10-24 DIAGNOSIS — F17200 Nicotine dependence, unspecified, uncomplicated: Secondary | ICD-10-CM | POA: Diagnosis present

## 2022-10-24 DIAGNOSIS — J441 Chronic obstructive pulmonary disease with (acute) exacerbation: Secondary | ICD-10-CM | POA: Diagnosis not present

## 2022-10-24 DIAGNOSIS — G4733 Obstructive sleep apnea (adult) (pediatric): Secondary | ICD-10-CM | POA: Diagnosis present

## 2022-10-24 DIAGNOSIS — Z1152 Encounter for screening for COVID-19: Secondary | ICD-10-CM

## 2022-10-24 DIAGNOSIS — K219 Gastro-esophageal reflux disease without esophagitis: Secondary | ICD-10-CM | POA: Diagnosis present

## 2022-10-24 DIAGNOSIS — Z96641 Presence of right artificial hip joint: Secondary | ICD-10-CM | POA: Diagnosis present

## 2022-10-24 DIAGNOSIS — Z87891 Personal history of nicotine dependence: Secondary | ICD-10-CM

## 2022-10-24 DIAGNOSIS — E7800 Pure hypercholesterolemia, unspecified: Secondary | ICD-10-CM | POA: Diagnosis present

## 2022-10-24 DIAGNOSIS — T380X5A Adverse effect of glucocorticoids and synthetic analogues, initial encounter: Secondary | ICD-10-CM | POA: Diagnosis present

## 2022-10-24 LAB — BASIC METABOLIC PANEL
Anion gap: 10 (ref 5–15)
BUN: 13 mg/dL (ref 8–23)
CO2: 23 mmol/L (ref 22–32)
Calcium: 8.9 mg/dL (ref 8.9–10.3)
Chloride: 108 mmol/L (ref 98–111)
Creatinine, Ser: 0.73 mg/dL (ref 0.44–1.00)
GFR, Estimated: >60 mL/min (ref >60)
Glucose, Bld: 90 mg/dL (ref 70–99)
Potassium: 3.7 mmol/L (ref 3.5–5.1)
Sodium: 141 mmol/L (ref 135–145)

## 2022-10-24 LAB — TROPONIN I (HIGH SENSITIVITY)
Troponin I (High Sensitivity): 7 ng/L (ref <18)
Troponin I (High Sensitivity): 4 ng/L (ref <18)

## 2022-10-24 LAB — BRAIN NATRIURETIC PEPTIDE: B Natriuretic Peptide: 325.8 pg/mL — ABNORMAL HIGH (ref 0.0–100.0)

## 2022-10-24 LAB — HEPATIC FUNCTION PANEL
ALT: 41 U/L (ref 0–44)
AST: 34 U/L (ref 15–41)
Albumin: 3.9 g/dL (ref 3.5–5.0)
Alkaline Phosphatase: 105 U/L (ref 38–126)
Bilirubin, Direct: 0.2 mg/dL (ref 0.0–0.2)
Indirect Bilirubin: 0.4 mg/dL (ref 0.3–0.9)
Total Bilirubin: 0.6 mg/dL (ref 0.3–1.2)
Total Protein: 6.9 g/dL (ref 6.5–8.1)

## 2022-10-24 LAB — CBC
HCT: 45.9 % (ref 36.0–46.0)
Hemoglobin: 14.8 g/dL (ref 12.0–15.0)
MCH: 30.7 pg (ref 26.0–34.0)
MCHC: 32.2 g/dL (ref 30.0–36.0)
MCV: 95.2 fL (ref 80.0–100.0)
Platelets: 294 10*3/uL (ref 150–400)
RBC: 4.82 MIL/uL (ref 3.87–5.11)
RDW: 13.4 % (ref 11.5–15.5)
WBC: 15.5 10*3/uL — ABNORMAL HIGH (ref 4.0–10.5)
nRBC: 0 % (ref 0.0–0.2)

## 2022-10-24 LAB — MAGNESIUM: Magnesium: 2.4 mg/dL (ref 1.7–2.4)

## 2022-10-24 LAB — RESP PANEL BY RT-PCR (FLU A&B, COVID) ARPGX2
Influenza A by PCR: NEGATIVE
Influenza B by PCR: NEGATIVE
SARS Coronavirus 2 by RT PCR: NEGATIVE

## 2022-10-24 LAB — TSH: TSH: 1.021 u[IU]/mL (ref 0.350–4.500)

## 2022-10-24 MED ORDER — ENOXAPARIN SODIUM 40 MG/0.4ML IJ SOSY: 40.0000 mg | PREFILLED_SYRINGE | INTRAMUSCULAR | Status: DC

## 2022-10-24 MED ORDER — LEVALBUTEROL HCL 1.25 MG/0.5ML IN NEBU: 1.2500 mg | INHALATION_SOLUTION | Freq: Four times a day (QID) | RESPIRATORY_TRACT | Status: DC

## 2022-10-24 MED ORDER — DILTIAZEM HCL 25 MG/5ML IV SOLN
10.0000 mg | Freq: Once | INTRAVENOUS | Status: AC
  Administered 2022-10-24: 10 mg via INTRAVENOUS
  Filled 2022-10-24: qty 5

## 2022-10-24 MED ORDER — DULOXETINE HCL 30 MG PO CPEP
60.0000 mg | ORAL_CAPSULE | Freq: Every day | ORAL | Status: DC
  Administered 2022-10-25 – 2022-10-26 (×2): 60 mg via ORAL
  Filled 2022-10-24 (×2): qty 2

## 2022-10-24 MED ORDER — IPRATROPIUM-ALBUTEROL 0.5-2.5 (3) MG/3ML IN SOLN
3.0000 mL | Freq: Once | RESPIRATORY_TRACT | Status: AC
  Administered 2022-10-24: 3 mL via RESPIRATORY_TRACT
  Filled 2022-10-24: qty 3

## 2022-10-24 MED ORDER — LEVALBUTEROL HCL 1.25 MG/0.5ML IN NEBU
INHALATION_SOLUTION | RESPIRATORY_TRACT | Status: AC
  Filled 2022-10-24: qty 0.5

## 2022-10-24 MED ORDER — ACETAMINOPHEN 500 MG PO TABS: 500.0000 mg | ORAL_TABLET | Freq: Four times a day (QID) | ORAL | Status: DC | PRN

## 2022-10-24 MED ORDER — IPRATROPIUM BROMIDE 0.02 % IN SOLN
0.5000 mg | Freq: Four times a day (QID) | RESPIRATORY_TRACT | Status: DC
  Administered 2022-10-25 – 2022-10-26 (×6): 0.5 mg via RESPIRATORY_TRACT
  Filled 2022-10-24 (×6): qty 2.5

## 2022-10-24 MED ORDER — ENOXAPARIN SODIUM 40 MG/0.4ML IJ SOSY
40.0000 mg | PREFILLED_SYRINGE | INTRAMUSCULAR | Status: DC
  Administered 2022-10-24: 40 mg via SUBCUTANEOUS
  Filled 2022-10-24: qty 0.4

## 2022-10-24 MED ORDER — DILTIAZEM HCL-DEXTROSE 125-5 MG/125ML-% IV SOLN (PREMIX)
5.0000 mg/h | INTRAVENOUS | Status: DC
  Administered 2022-10-24: 5 mg/h via INTRAVENOUS
  Filled 2022-10-24 (×2): qty 125

## 2022-10-24 MED ORDER — PANTOPRAZOLE SODIUM 40 MG PO TBEC
40.0000 mg | DELAYED_RELEASE_TABLET | Freq: Every day | ORAL | Status: DC
  Administered 2022-10-25 – 2022-10-26 (×2): 40 mg via ORAL
  Filled 2022-10-24 (×2): qty 1

## 2022-10-24 MED ORDER — METHYLPREDNISOLONE SODIUM SUCC 125 MG IJ SOLR
80.0000 mg | Freq: Once | INTRAMUSCULAR | Status: AC
  Administered 2022-10-24: 80 mg via INTRAVENOUS
  Filled 2022-10-24: qty 2

## 2022-10-24 MED ORDER — IPRATROPIUM-ALBUTEROL 0.5-2.5 (3) MG/3ML IN SOLN
3.0000 mL | RESPIRATORY_TRACT | Status: DC
  Administered 2022-10-24: 3 mL via RESPIRATORY_TRACT
  Filled 2022-10-24: qty 3

## 2022-10-24 MED ORDER — EZETIMIBE 10 MG PO TABS
10.0000 mg | ORAL_TABLET | Freq: Every day | ORAL | Status: DC
  Administered 2022-10-25 – 2022-10-26 (×2): 10 mg via ORAL
  Filled 2022-10-24 (×2): qty 1

## 2022-10-24 MED ORDER — ORAL CARE MOUTH RINSE: 15.0000 mL | OROMUCOSAL | Status: DC | PRN

## 2022-10-24 MED ORDER — PREDNISONE 20 MG PO TABS: 40.0000 mg | ORAL_TABLET | Freq: Every day | ORAL | Status: DC

## 2022-10-24 MED ORDER — METOPROLOL TARTRATE 25 MG PO TABS
25.0000 mg | ORAL_TABLET | Freq: Two times a day (BID) | ORAL | Status: DC
  Administered 2022-10-24: 25 mg via ORAL
  Filled 2022-10-24: qty 1

## 2022-10-24 MED ORDER — UMECLIDINIUM-VILANTEROL 62.5-25 MCG/ACT IN AEPB
1.0000 | INHALATION_SPRAY | Freq: Every day | RESPIRATORY_TRACT | Status: DC
  Administered 2022-10-25 – 2022-10-26 (×2): 1 via RESPIRATORY_TRACT
  Filled 2022-10-24: qty 14

## 2022-10-24 MED ORDER — LORAZEPAM 0.5 MG PO TABS: 0.5000 mg | ORAL_TABLET | Freq: Two times a day (BID) | ORAL | Status: DC | PRN

## 2022-10-24 MED ORDER — FLUTICASONE PROPIONATE 50 MCG/ACT NA SUSP: 1.0000 | Freq: Every day | NASAL | Status: DC | PRN

## 2022-10-24 MED ORDER — NICOTINE 7 MG/24HR TD PT24
7.0000 mg | MEDICATED_PATCH | TRANSDERMAL | Status: DC
  Administered 2022-10-24 – 2022-10-25 (×2): 7 mg via TRANSDERMAL
  Filled 2022-10-24 (×3): qty 1

## 2022-10-24 MED ORDER — IPRATROPIUM-ALBUTEROL 0.5-2.5 (3) MG/3ML IN SOLN
3.0000 mL | Freq: Four times a day (QID) | RESPIRATORY_TRACT | Status: DC
  Administered 2022-10-24: 3 mL via RESPIRATORY_TRACT
  Filled 2022-10-24: qty 3

## 2022-10-24 NOTE — Assessment & Plan Note (Addendum)
Patient was on Cardizem drip this morning.  After restarting the increased dose of oral Cardizem and metoprolol we were able to come off the drip this afternoon.  Eliquis started.

## 2022-10-24 NOTE — ED Notes (Signed)
Pt desaturated to 88% while in bed. Pt encouraged to breathe deeply, SPO2 now 92%.

## 2022-10-24 NOTE — ED Notes (Signed)
Pt to ED for shortness of breath and irregular heart rate since 1 week ago. Hx of a fib, takes '81mg'$  aspirin daily, was taken off Eliquis after a fib had been controlled for awhile. Pt states that recent COPD exascerbation may have thrown her back into a fib. Pt denies chest pain at this time. Alert, oriented, on RA. Sister at bedside who is POA.

## 2022-10-24 NOTE — Progress Notes (Signed)
Patient is going to transfer to 2A - Room 247 - due to Cardizem gtt to be administered. Report given assigned RN. Awaiting for room to be cleaned.

## 2022-10-24 NOTE — ED Triage Notes (Signed)
Pt over from [facility]. Per [facility], pt in A-fib, has hx of the same but not controlled currently. [place] also reports SOB and possible JVD.  Pt reports started with cough and congestion, has COPD, was seen at her MD and given medications. Pt reports was prescribed steroids and now her heart rate is all over the place. Pt also reports feels weak and SOB.

## 2022-10-24 NOTE — H&P (Signed)
History and Physical    Patient: Sabrina Hudson DOB: June 28, 1951 DOA: 10/24/2022 DOS: the patient was seen and examined on 10/24/2022 PCP: Derinda Late, MD  Patient coming from: Home  Chief Complaint:  Chief Complaint  Patient presents with   Irregular Heart Beat   HPI: Sabrina Hudson is a 71 y.o. female with medical history significant of COPD, atrial fibrillation not on AC, hypertension, hyperlipidemia who presents to the ED with complaints of shortness of breath and high heart rate.  Sabrina Hudson states she went to a barbecue on 10/22 where she noticed that her breathing started to get worse after exposure to the smoke.  She endorses increased dyspnea, increased cough without significant sputum production.  She already had azithromycin at home provided to her by her pulmonologist in case of exacerbation and started that on 10/22, completing course on 10/26.  She contacted her PCP on 10/25 and was started on prednisone that day, 20 mg daily in addition to DuoNebs every 6 hours.  Sabrina Hudson has been taking these as instructed by her PCP with the help of her sister, who is an Therapist, sports.  Once she started using her DuoNebs, she noticed that her heart rate became very fast and irregular.  She has a history of atrial fibrillation currently on Lopressor.  She is no longer taking Eliquis as it was discontinued by her cardiologist.  In addition to taking her Lopressor, she has been taking diltiazem 30 mg every 4 hours for heart rate over 100 and despite this, her heart rate continues to be elevated.  Due to this, she came to the ED.  Sabrina Hudson endorses subjective fever on 10/23 and 10/24, but otherwise denies any nausea, vomiting, diarrhea, lower extremity swelling, chest pain.  ED course: On arrival to the ED, patient was hypertensive at 141/92 with initial heart rate of 72.  It was noted that with any movement, heart rate went well above 105.  Initial work-up with white  blood count of 15.5.  Chest x-ray with no evidence of acute cardiopulmonary disease.  TR[facility] contacted for admission due to failure of outpatient treatment for both COPD exacerbation and atrial fibrillation.  Review of Systems: As mentioned in the history of present illness. All other systems reviewed and are negative. Past Medical History:  Diagnosis Date   AF (atrial fibrillation) (HCC)    Anxiety    Arthritis    COPD (chronic obstructive pulmonary disease) (HCC)    Depression    GERD (gastroesophageal reflux disease)    Headache    migraine   Hearing impaired person, bilateral    History of 2019 novel coronavirus disease (COVID-19) 01/23/2020   HLD (hyperlipidemia)    Hypertension    Past Surgical History:  Procedure Laterality Date   ABDOMINAL HYSTERECTOMY     CESAREAN SECTION     x 2   COLONOSCOPY  2011   COLONOSCOPY WITH PROPOFOL N/A 07/30/2021   Procedure: COLONOSCOPY WITH PROPOFOL;  Surgeon: Toledo, Benay Pike, MD;  Location: [facility] ENDOSCOPY;  Service: Gastroenterology;  Laterality: N/A;   ESOPHAGOGASTRODUODENOSCOPY ENDOSCOPY  2011   TONSILLECTOMY     TOTAL HIP ARTHROPLASTY Right 07/09/2020   Procedure: RIGHT TOTAL HIP ARTHROPLASTY ANTERIOR APPROACH;  Surgeon: Hessie Knows, MD;  Location: [facility] ORS;  Service: Orthopedics;  Laterality: Right;   TUBAL LIGATION     Social History:  reports that she quit smoking about 2 years ago. Her smoking use included cigarettes. She has a 22.50 pack-year smoking history. She  has never used smokeless tobacco. She reports that she does not currently use alcohol. She reports that she does not use drugs.  Allergies  Allergen Reactions   Bupropion Other (See Comments)    Bad Dreams, Depression, Confusion   Gabapentin     Other reaction(s): Other (See Comments) Confusion   Simvastatin Other (See Comments)    Muscle pain    Family History  Problem Relation Age of Onset   COPD Mother    Parkinson's disease Father    Breast cancer Neg Hx      Prior to Admission medications   Medication Sig Start Date End Date Taking? Authorizing Provider  acetaminophen (TYLENOL) 500 MG tablet Take 500 mg by mouth every 6 (six) hours as needed for moderate pain.    [provider]  diltiazem (CARDIZEM CD) 180 MG 24 hr capsule Take by mouth daily.    [provider]  diltiazem (CARDIZEM) 30 MG tablet Take 30 mg by mouth daily as needed (heart rate/pulse increase to greater than 100.).     [provider]  docusate sodium (COLACE) 100 MG capsule Take 1 capsule (100 mg total) by mouth 2 (two) times daily. Patient not taking: Reported on 12/31/2021 07/13/20   Duanne Guess, PA-C  DULoxetine (CYMBALTA) 60 MG capsule Take 60 mg by mouth daily.    [provider]  ezetimibe (ZETIA) 10 MG tablet Take 10 mg by mouth daily.    [provider]  fluticasone (FLONASE) 50 MCG/ACT nasal spray Place 1 spray into both nostrils daily as needed for allergies or rhinitis.    [provider]  LORazepam (ATIVAN) 0.5 MG tablet Take 0.5 mg by mouth 2 (two) times daily as needed for anxiety. 12/11/21   [provider]  metoprolol tartrate (LOPRESSOR) 25 MG tablet Take 25 mg by mouth 2 (two) times daily.    [provider]  omeprazole (PRILOSEC) 40 MG capsule Take 40 mg by mouth daily. 02/26/20   [provider]    Physical Exam: Vitals:   10/24/22 1400 10/24/22 1402 10/24/22 1453 10/24/22 1455  BP: 133/86  120/84 120/84  Pulse: (!) 102   (!) 108  Resp: 14   16  Temp:  98.3 F (36.8 C)  98.2 F (36.8 C)  TempSrc:  Oral    SpO2: 95%  94% 94%  Weight:      Height:       Physical Exam Vitals and nursing note reviewed.  Constitutional:      General: She is not in acute distress.    Appearance: She is normal weight. She is not toxic-appearing.  HENT:     Head: Normocephalic and atraumatic.     Mouth/Throat:     Mouth: Mucous membranes are moist.     Pharynx: Oropharynx is clear.   Eyes:     Conjunctiva/sclera: Conjunctivae normal.     Pupils: Pupils are equal, round, and reactive to light.  Cardiovascular:     Rate and Rhythm: Tachycardia present. Rhythm irregularly irregular.     Heart sounds: No murmur heard. Pulmonary:     Effort: Tachypnea present. No accessory muscle usage.     Breath sounds: Decreased air movement (Decreased air movement in the left middle and lower lung fields) present. Wheezing (Diffuse expiratory wheezing throughout) present. No rhonchi or rales.  Abdominal:     General: Bowel sounds are normal.     Palpations: Abdomen is soft.  Musculoskeletal:     Right lower leg:  No edema.     Left lower leg: No edema.  Skin:    General: Skin is warm and dry.  Neurological:     General: No focal deficit present.     Mental Status: She is alert and oriented to person, place, and time. Mental status is at baseline.  Psychiatric:        Mood and Affect: Mood normal.        Behavior: Behavior normal.    Data Reviewed: White blood count elevated at 15.5.  No thrombocytopenia or anemia.  BMP with no electrolyte or renal dysfunction.  Hepatic function panel within normal limits.  Troponin negative x2.  BNP elevated at 325.  Chest x-ray personally reviewed.  No focal opacities or pleural effusions noted.  EKG personally reviewed.  Notable for atrial fibrillation with normal ventricular rate at 81.  No ST or T wave changes concerning for acute ischemia.  Results are pending, will review when available.  Assessment and Plan: * COPD exacerbation [facility] Sabrina Hudson is presenting with 6-day history of increased dyspnea, increased cough consistent with COPD exacerbation.  She is currently not requiring oxygen at rest, but does desaturate with any movement to below 88%.    She has already completed a 5-day course of azithromycin and given lack of sputum production, will hold off on additional course.  - S/p Solu-Medrol 80 mg - Start prednisone 40 mg  daily tomorrow - DuoNebs every 4 hours - Start LAMA-LABA - Continue supplemental oxygen as needed to maintain oxygen saturation above 88%  Atrial fibrillation with RVR ([place]) History of atrial fibrillation with RVR during previous infection approximately 2 years ago now presenting with RVR again in the setting of COPD exacerbation and albuterol use.  She has been taking both Lopressor and diltiazem short acting 30 mg every 4 hours without much improvement in her heart rate.  Initially on presentation she was in the 70s, but is now persisting in the 120s to 130s.   - Telemetry monitoring - Holding home p.o. diltiazem - Restart home metoprolol - Start diltiazem infusion - TTE - Magnesium and TSH pending - Sabrina Hudson would like to defer anticoagulation at this time pending further discussions with her cardiologist. - Start VTE prophylaxis dosing of Lovenox  Smoking - Nicotine patch while admitted - Smoking cessation resources to be provided  OSA (obstructive sleep apnea) - CPAP at bedtime   Advance Care Planning:   Code Status: Full Code   Consults: None  Family Communication: Patient's sister updated at bedside.  Severity of Illness: The appropriate patient status for this patient is OBSERVATION. Observation status is judged to be reasonable and necessary in order to provide the required intensity of service to ensure the patient's safety. The patient's presenting symptoms, physical exam findings, and initial radiographic and laboratory data in the context of their medical condition is felt to place them at decreased risk for further clinical deterioration. Furthermore, it is anticipated that the patient will be medically stable for discharge from the hospital within 2 midnights of admission.   Author: Jose Persia, MD 10/24/2022 3:48 PM  For on call review [email or web address].

## 2022-10-24 NOTE — ED Provider Notes (Signed)
[facility] Provider Note    None    (approximate)   History   Irregular Heart Beat   HPI  Sabrina Hudson is a 71 y.o. female here with irregular heartbeat and shortness of breath.  The patient has a history of COPD, paroxysmal but fairly well controlled at baseline A-fib.  Over the last week, she has had severe shortness of breath, wheezing, and palpitations.  She has been treated by her PCP with steroids, scheduled albuterol, and has been taking oral diltiazem with as needed doses for her tachycardia.  Her heart rate has been anywhere from 90s up to the 150s.  She had significant shortness of breath and palpitations.  She has had continued wheezing.  Her sister, who is a Marine scientist, has been monitoring her and giving her diltiazem as well as managing her breathing and states that her wheezing has been fairly persistent and has not seem to improve much with the oral agents and breathing treatments.  She states that the amount of breathing treatments the patient requires for her breathing causes her to go into A-fib RVR and she is very symptomatic from this.     Physical Exam   Triage Vital Signs: ED Triage Vitals  Enc Vitals Group     BP 10/24/22 0959 (!) 141/92     Pulse Rate 10/24/22 0959 82     Resp 10/24/22 0959 20     Temp 10/24/22 1002 98.3 F (36.8 C)     Temp Source 10/24/22 1002 Oral     SpO2 10/24/22 0959 92 %     Weight 10/24/22 1000 185 lb (83.9 kg)     Height 10/24/22 1000 '5\' 6"'$  (1.676 m)     Head Circumference --      Peak Flow --      Pain Score 10/24/22 0959 4     Pain Loc --      Pain Edu? --      Excl. in [place]? --     Most recent vital signs: Vitals:   10/24/22 1731 10/24/22 1927  BP: 131/89 135/80  Pulse: (!) 101 98  Resp:  19  Temp:  98.1 F (36.7 C)  SpO2:  95%     General: Awake, no distress.  CV:  Good peripheral perfusion.  Tachycardic, irregularly irregular. Resp:  Tachypneic with diffuse wheezing and markedly  diminished aeration bilateral lungs. Abd:  No distention.  Other:  Mild lower extremity edema.   ED Results / Procedures / Treatments   Labs (all labs ordered are listed, but only abnormal results are displayed) Labs Reviewed  CBC - Abnormal; Notable for the following components:      Result Value   WBC 15.5 (*)    All other components within normal limits  BRAIN NATRIURETIC PEPTIDE - Abnormal; Notable for the following components:   B Natriuretic Peptide 325.8 (*)    All other components within normal limits  RESP PANEL BY RT-PCR (FLU A&B, COVID) ARPGX2  BASIC METABOLIC PANEL  HEPATIC FUNCTION PANEL  TSH  MAGNESIUM  HIV ANTIBODY (ROUTINE TESTING W REFLEX)  MAGNESIUM  BASIC METABOLIC PANEL  CBC WITH DIFFERENTIAL/PLATELET  TROPONIN I (HIGH SENSITIVITY)  TROPONIN I (HIGH SENSITIVITY)     EKG Atrial fibrillation, trickle rate 81.  QRS 90, QTc 457.  No acute ST elevations or depressions.  No acute evidence of acute ischemia or infarct.   RADIOLOGY Chest x-ray: No active disease   I also independently reviewed and  agree with radiologist interpretations.   PROCEDURES:  Critical Care performed: No     MEDICATIONS ORDERED IN ED: Medications  enoxaparin (LOVENOX) injection 40 mg (has no administration in time range)  nicotine (NICODERM CQ - dosed in mg/24 hr) patch 7 mg (7 mg Transdermal Patch Applied 10/24/22 1403)  predniSONE (DELTASONE) tablet 40 mg (has no administration in time range)  ipratropium-albuterol (DUONEB) 0.5-2.5 (3) MG/3ML nebulizer solution 3 mL (3 mLs Nebulization Not Given 10/24/22 1600)  umeclidinium-vilanterol (ANORO ELLIPTA) 62.5-25 MCG/ACT 1 puff (has no administration in time range)  diltiazem (CARDIZEM) 125 mg in dextrose 5% 125 mL (1 mg/mL) infusion (7.5 mg/hr Intravenous Rate/Dose Change 10/24/22 1730)  acetaminophen (TYLENOL) tablet 500 mg (has no administration in time range)  pantoprazole (PROTONIX) EC tablet 40 mg (has no administration  in time range)  LORazepam (ATIVAN) tablet 0.5 mg (has no administration in time range)  fluticasone (FLONASE) 50 MCG/ACT nasal spray 1 spray (has no administration in time range)  ezetimibe (ZETIA) tablet 10 mg (has no administration in time range)  DULoxetine (CYMBALTA) DR capsule 60 mg (has no administration in time range)  metoprolol tartrate (LOPRESSOR) tablet 25 mg (has no administration in time range)  methylPREDNISolone sodium succinate (SOLU-MEDROL) 125 mg/2 mL injection 80 mg (80 mg Intravenous Given 10/24/22 1041)  diltiazem (CARDIZEM) injection 10 mg (10 mg Intravenous Given 10/24/22 1042)  ipratropium-albuterol (DUONEB) 0.5-2.5 (3) MG/3ML nebulizer solution 3 mL (3 mLs Nebulization Given 10/24/22 1041)  ipratropium-albuterol (DUONEB) 0.5-2.5 (3) MG/3ML nebulizer solution 3 mL (3 mLs Nebulization Given 10/24/22 1041)  ipratropium-albuterol (DUONEB) 0.5-2.5 (3) MG/3ML nebulizer solution 3 mL (3 mLs Nebulization Given 10/24/22 1144)     IMPRESSION / MDM / ASSESSMENT AND PLAN / ED COURSE  I reviewed the triage vital signs and the nursing notes.                               The patient is on the cardiac monitor to evaluate for evidence of arrhythmia and/or significant heart rate changes.   Ddx:  Differential includes the following, with pertinent life- or limb-threatening emergencies considered:  COPD exacerbation with AFib, primary AFib, CHF, PE, PNA, anemia, ACS  Patient's presentation is most consistent with acute presentation with potential threat to life or bodily function.  MDM:  71 yo F with PMHx COPD, paroxysmal AFib, here with cough, SOB. Suspect primary COPD exacerbation complicated by AFib RVR in setting of her duoneb/steroid use. Clinically pt has increased WOB and mild hypoxia, and needs scheduled nebs with steroids. CXR shows no PNA. CBC with mild leukocytosis likely from steroids + copd exacerbation. Does not appear septic. LFTs normal. BNP slightly elevated -  may benefit from diuresis. EKG nonischemic.   Pt given duonebs, IV steroids and IV diltiazem for her subsequent tachycardia. Will admit as she has already been on oral steroids, oral dilt PRN and has had continued worsening of SOB, hypoxia.    MEDICATIONS GIVEN IN ED: Medications  enoxaparin (LOVENOX) injection 40 mg (has no administration in time range)  nicotine (NICODERM CQ - dosed in mg/24 hr) patch 7 mg (7 mg Transdermal Patch Applied 10/24/22 1403)  predniSONE (DELTASONE) tablet 40 mg (has no administration in time range)  ipratropium-albuterol (DUONEB) 0.5-2.5 (3) MG/3ML nebulizer solution 3 mL (3 mLs Nebulization Not Given 10/24/22 1600)  umeclidinium-vilanterol (ANORO ELLIPTA) 62.5-25 MCG/ACT 1 puff (has no administration in time range)  diltiazem (CARDIZEM) 125 mg in dextrose 5% 125  mL (1 mg/mL) infusion (7.5 mg/hr Intravenous Rate/Dose Change 10/24/22 1730)  acetaminophen (TYLENOL) tablet 500 mg (has no administration in time range)  pantoprazole (PROTONIX) EC tablet 40 mg (has no administration in time range)  LORazepam (ATIVAN) tablet 0.5 mg (has no administration in time range)  fluticasone (FLONASE) 50 MCG/ACT nasal spray 1 spray (has no administration in time range)  ezetimibe (ZETIA) tablet 10 mg (has no administration in time range)  DULoxetine (CYMBALTA) DR capsule 60 mg (has no administration in time range)  metoprolol tartrate (LOPRESSOR) tablet 25 mg (has no administration in time range)  methylPREDNISolone sodium succinate (SOLU-MEDROL) 125 mg/2 mL injection 80 mg (80 mg Intravenous Given 10/24/22 1041)  diltiazem (CARDIZEM) injection 10 mg (10 mg Intravenous Given 10/24/22 1042)  ipratropium-albuterol (DUONEB) 0.5-2.5 (3) MG/3ML nebulizer solution 3 mL (3 mLs Nebulization Given 10/24/22 1041)  ipratropium-albuterol (DUONEB) 0.5-2.5 (3) MG/3ML nebulizer solution 3 mL (3 mLs Nebulization Given 10/24/22 1041)  ipratropium-albuterol (DUONEB) 0.5-2.5 (3) MG/3ML nebulizer  solution 3 mL (3 mLs Nebulization Given 10/24/22 1144)     Consults:  Hospitalist   EMR reviewed       FINAL CLINICAL IMPRESSION(S) / ED DIAGNOSES   Final diagnoses:  COPD exacerbation ([place])  Atrial fibrillation with rapid ventricular response ([place])     Rx / DC Orders   ED Discharge Orders     None        Note:  This document was prepared using Dragon voice recognition software and may include unintentional dictation errors.   Duffy Bruce, MD 10/24/22 2024

## 2022-10-24 NOTE — Assessment & Plan Note (Signed)
CPAP at bedtime

## 2022-10-24 NOTE — Assessment & Plan Note (Signed)
The patient received Solu-Medrol 40 mg daily while in the hospital.  Prescribed Anoro inhaler and albuterol inhaler upon discharge.  Also prescribed prednisone for few more days.

## 2022-10-24 NOTE — ED Notes (Signed)
Pt eating

## 2022-10-24 NOTE — ED Notes (Signed)
Did not ambulate pt in hall. Pt already desaturated to 88% in bed. This was already documented.

## 2022-10-24 NOTE — Assessment & Plan Note (Addendum)
-  Nicotine patch 

## 2022-10-25 DIAGNOSIS — Z8616 Personal history of COVID-19: Secondary | ICD-10-CM | POA: Diagnosis not present

## 2022-10-25 DIAGNOSIS — I10 Essential (primary) hypertension: Secondary | ICD-10-CM | POA: Diagnosis present

## 2022-10-25 DIAGNOSIS — Z888 Allergy status to other drugs, medicaments and biological substances status: Secondary | ICD-10-CM | POA: Diagnosis not present

## 2022-10-25 DIAGNOSIS — F32A Depression, unspecified: Secondary | ICD-10-CM

## 2022-10-25 DIAGNOSIS — Z96641 Presence of right artificial hip joint: Secondary | ICD-10-CM | POA: Diagnosis present

## 2022-10-25 DIAGNOSIS — G4733 Obstructive sleep apnea (adult) (pediatric): Secondary | ICD-10-CM

## 2022-10-25 DIAGNOSIS — K219 Gastro-esophageal reflux disease without esophagitis: Secondary | ICD-10-CM | POA: Diagnosis present

## 2022-10-25 DIAGNOSIS — F17200 Nicotine dependence, unspecified, uncomplicated: Secondary | ICD-10-CM

## 2022-10-25 DIAGNOSIS — I4891 Unspecified atrial fibrillation: Secondary | ICD-10-CM | POA: Diagnosis not present

## 2022-10-25 DIAGNOSIS — F419 Anxiety disorder, unspecified: Secondary | ICD-10-CM | POA: Diagnosis present

## 2022-10-25 DIAGNOSIS — Z1152 Encounter for screening for COVID-19: Secondary | ICD-10-CM | POA: Diagnosis not present

## 2022-10-25 DIAGNOSIS — E7800 Pure hypercholesterolemia, unspecified: Secondary | ICD-10-CM

## 2022-10-25 DIAGNOSIS — T380X5A Adverse effect of glucocorticoids and synthetic analogues, initial encounter: Secondary | ICD-10-CM | POA: Diagnosis present

## 2022-10-25 DIAGNOSIS — I480 Paroxysmal atrial fibrillation: Secondary | ICD-10-CM | POA: Diagnosis present

## 2022-10-25 DIAGNOSIS — Z79899 Other long term (current) drug therapy: Secondary | ICD-10-CM | POA: Diagnosis not present

## 2022-10-25 DIAGNOSIS — Z87891 Personal history of nicotine dependence: Secondary | ICD-10-CM | POA: Diagnosis not present

## 2022-10-25 DIAGNOSIS — J441 Chronic obstructive pulmonary disease with (acute) exacerbation: Secondary | ICD-10-CM | POA: Diagnosis not present

## 2022-10-25 DIAGNOSIS — Z825 Family history of asthma and other chronic lower respiratory diseases: Secondary | ICD-10-CM | POA: Diagnosis not present

## 2022-10-25 LAB — BASIC METABOLIC PANEL
Anion gap: 8 (ref 5–15)
BUN: 18 mg/dL (ref 8–23)
CO2: 24 mmol/L (ref 22–32)
Calcium: 8.9 mg/dL (ref 8.9–10.3)
Chloride: 106 mmol/L (ref 98–111)
Creatinine, Ser: 0.71 mg/dL (ref 0.44–1.00)
GFR, Estimated: >60 mL/min (ref >60)
Glucose, Bld: 131 mg/dL — ABNORMAL HIGH (ref 70–99)
Potassium: 4.1 mmol/L (ref 3.5–5.1)
Sodium: 138 mmol/L (ref 135–145)

## 2022-10-25 LAB — CBC WITH DIFFERENTIAL/PLATELET
Abs Immature Granulocytes: 0.15 10*3/uL — ABNORMAL HIGH (ref 0.00–0.07)
Basophils Absolute: 0 10*3/uL (ref 0.0–0.1)
Basophils Relative: 0 %
Eosinophils Absolute: 0 10*3/uL (ref 0.0–0.5)
Eosinophils Relative: 0 %
HCT: 41.3 % (ref 36.0–46.0)
Hemoglobin: 13.9 g/dL (ref 12.0–15.0)
Immature Granulocytes: 1 %
Lymphocytes Relative: 9 %
Lymphs Abs: 1.1 10*3/uL (ref 0.7–4.0)
MCH: 30.5 pg (ref 26.0–34.0)
MCHC: 33.7 g/dL (ref 30.0–36.0)
MCV: 90.8 fL (ref 80.0–100.0)
Monocytes Absolute: 0.7 10*3/uL (ref 0.1–1.0)
Monocytes Relative: 5 %
Neutro Abs: 10.6 10*3/uL — ABNORMAL HIGH (ref 1.7–7.7)
Neutrophils Relative %: 85 %
Platelets: 281 10*3/uL (ref 150–400)
RBC: 4.55 MIL/uL (ref 3.87–5.11)
RDW: 13.2 % (ref 11.5–15.5)
WBC: 12.6 10*3/uL — ABNORMAL HIGH (ref 4.0–10.5)
nRBC: 0 % (ref 0.0–0.2)

## 2022-10-25 LAB — MAGNESIUM: Magnesium: 2.3 mg/dL (ref 1.7–2.4)

## 2022-10-25 LAB — HIV ANTIBODY (ROUTINE TESTING W REFLEX): HIV Screen 4th Generation wRfx: NONREACTIVE

## 2022-10-25 MED ORDER — DILTIAZEM HCL ER COATED BEADS 120 MG PO CP24
240.0000 mg | ORAL_CAPSULE | Freq: Every day | ORAL | Status: DC
  Administered 2022-10-25 – 2022-10-26 (×2): 240 mg via ORAL
  Filled 2022-10-25 (×2): qty 2

## 2022-10-25 MED ORDER — METOPROLOL TARTRATE 25 MG PO TABS
25.0000 mg | ORAL_TABLET | Freq: Two times a day (BID) | ORAL | Status: DC
  Administered 2022-10-25 – 2022-10-26 (×3): 25 mg via ORAL
  Filled 2022-10-25 (×3): qty 1

## 2022-10-25 MED ORDER — APIXABAN 5 MG PO TABS
5.0000 mg | ORAL_TABLET | Freq: Two times a day (BID) | ORAL | Status: DC
  Administered 2022-10-25 – 2022-10-26 (×3): 5 mg via ORAL
  Filled 2022-10-25 (×3): qty 1

## 2022-10-25 MED ORDER — METHYLPREDNISOLONE SODIUM SUCC 40 MG IJ SOLR
40.0000 mg | Freq: Every day | INTRAMUSCULAR | Status: DC
  Administered 2022-10-25 – 2022-10-26 (×2): 40 mg via INTRAVENOUS
  Filled 2022-10-25 (×2): qty 1

## 2022-10-25 NOTE — Consult Note (Signed)
ANTICOAGULATION CONSULT NOTE - Initial Consult  Pharmacy Consult for apixaban Indication: atrial fibrillation  Allergies  Allergen Reactions   Bupropion Other (See Comments)    Bad Dreams, Depression, Confusion   Gabapentin     Other reaction(s): Other (See Comments) Confusion   Simvastatin Other (See Comments)    Muscle pain    Patient Measurements: Height: '5\' 6"'$  (167.6 cm) Weight: 83.9 kg (185 lb) IBW/kg (Calculated) : 59.3   Vital Signs: Temp: 98.1 F (36.7 C) (10/29 1121) BP: 142/80 (10/29 1121) Pulse Rate: 70 (10/29 1121)  Labs: Recent Labs    10/24/22 1015 10/24/22 1230 10/25/22 0426  HGB 14.8  --  13.9  HCT 45.9  --  41.3  PLT 294  --  281  CREATININE 0.73  --  0.71  TROPONINIHS 7 4  --     Estimated Creatinine Clearance: 73.4 mL/min (by C-G formula based on SCr of 0.71 mg/dL).   Medical History: Past Medical History:  Diagnosis Date   AF (atrial fibrillation) (HCC)    Anxiety    Arthritis    COPD (chronic obstructive pulmonary disease) (HCC)    Depression    GERD (gastroesophageal reflux disease)    Headache    migraine   Hearing impaired person, bilateral    History of 2019 novel coronavirus disease (COVID-19) 01/23/2020   HLD (hyperlipidemia)    Hypertension     Assessment: 72 y.o. female with medical history significant of COPD, atrial fibrillation not on AC, hypertension, hyperlipidemia.   Goal of Therapy:  Monitor platelets by anticoagulation protocol: Yes   Plan:  Apixaban '5mg'$  BID  Alison Murray 10/25/2022,11:25 AM

## 2022-10-25 NOTE — Assessment & Plan Note (Signed)
On Zetia

## 2022-10-25 NOTE — Assessment & Plan Note (Signed)
On Cymbalta 

## 2022-10-25 NOTE — Assessment & Plan Note (Signed)
On metoprolol and Cardizem CD

## 2022-10-25 NOTE — Consult Note (Signed)
Sabrina Hudson is a 71 y.o. female  [id number]  Primary Cardiologist: Dr. Stevphen [facility] cardi[facility] Reason for Consultation: Atrial fibrillation  HPI: This is a 71 year old white female with a past medical history of COPD and atrial fibrillation who was admitted with shortness of breath, palpitation but no chest pain   Review of Systems: No orthopnea PND or leg swelling   Past Medical History:  Diagnosis Date   AF (atrial fibrillation) (HCC)    Anxiety    Arthritis    COPD (chronic obstructive pulmonary disease) ([place])    Depression    GERD (gastroesophageal reflux disease)    Headache    migraine   Hearing impaired person, bilateral    History of 2019 novel coronavirus disease (COVID-19) 01/23/2020   HLD (hyperlipidemia)    Hypertension     Medications Prior to Admission  Medication Sig Dispense Refill   acetaminophen (TYLENOL) 500 MG tablet Take 500 mg by mouth every 6 (six) hours as needed for moderate pain.     diltiazem (CARDIZEM CD) 180 MG 24 hr capsule Take by mouth daily.     diltiazem (CARDIZEM) 30 MG tablet Take 30 mg by mouth daily as needed (heart rate/pulse increase to greater than 100.).      docusate sodium (COLACE) 100 MG capsule Take 1 capsule (100 mg total) by mouth 2 (two) times daily. (Patient not taking: Reported on 12/31/2021) 10 capsule 0   DULoxetine (CYMBALTA) 60 MG capsule Take 60 mg by mouth daily.     ezetimibe (ZETIA) 10 MG tablet Take 10 mg by mouth daily.     fluticasone (FLONASE) 50 MCG/ACT nasal spray Place 1 spray into both nostrils daily as needed for allergies or rhinitis.     LORazepam (ATIVAN) 0.5 MG tablet Take 0.5 mg by mouth 2 (two) times daily as needed for anxiety.     metoprolol tartrate (LOPRESSOR) 25 MG tablet Take 25 mg by mouth 2 (two) times daily.     omeprazole (PRILOSEC) 40 MG capsule Take 40 mg by mouth daily.        apixaban  5 mg Oral BID   diltiazem  240 mg Oral Daily   DULoxetine  60 mg Oral Daily    ezetimibe  10 mg Oral Daily   ipratropium  0.5 mg Nebulization Q6H   methylPREDNISolone (SOLU-MEDROL) injection  40 mg Intravenous Daily   metoprolol tartrate  25 mg Oral BID   nicotine  7 mg Transdermal Q24H   pantoprazole  40 mg Oral Daily   umeclidinium-vilanterol  1 puff Inhalation Daily    Infusions:   Allergies  Allergen Reactions   Bupropion Other (See Comments)    Bad Dreams, Depression, Confusion   Gabapentin     Other reaction(s): Other (See Comments) Confusion   Simvastatin Other (See Comments)    Muscle pain    Social History   Socioeconomic History   Marital status: Widowed    Spouse name: Not on file   Number of children: Not on file   Years of education: Not on file   Highest education level: Not on file  Occupational History   Not on file  Tobacco Use   Smoking status: Former    Packs/day: 0.50    Years: 45.00    Total pack years: 22.50    Types: Cigarettes    Quit date: 05/20/2020    Years since quitting: 2.4   Smokeless tobacco: Never  Vaping Use   Vaping Use: Never used  Substance and Sexual Activity   Alcohol use: Not Currently   Drug use: Never   Sexual activity: Not Currently  Other Topics Concern   Not on file  Social History Narrative   Not on file   Social Determinants of Health   Financial Resource Strain: Not on file  Food Insecurity: No Food Insecurity (10/24/2022)   Hunger Vital Sign    Worried About Running Out of Food in the Last Year: Never true    Ran Out of Food in the Last Year: Never true  Transportation Needs: No Transportation Needs (10/24/2022)   PRAPARE - Hydrologist (Medical): No    Lack of Transportation (Non-Medical): No  Physical Activity: Not on file  Stress: Not on file  Social Connections: Not on file  Intimate Partner Violence: Not At Risk (10/24/2022)   Humiliation, Afraid, Rape, and Kick questionnaire    Fear of Current or Ex-Partner: No    Emotionally Abused: No     Physically Abused: No    Sexually Abused: No    Family History  Problem Relation Age of Onset   COPD Mother    Parkinson's disease Father    Breast cancer Neg Hx     PHYSICAL EXAM: Vitals:   10/25/22 0759 10/25/22 1121  BP: 132/75 (!) 142/80  Pulse: 69 70  Resp: 17 18  Temp: 97.8 F (36.6 C) 98.1 F (36.7 C)  SpO2: 96% 98%     Intake/Output Summary (Last 24 hours) at 10/25/2022 1310 Last data filed at 10/25/2022 1121 Gross per 24 hour  Intake 77.15 ml  Output 0 ml  Net 77.15 ml    General:  Well appearing. No respiratory difficulty HEENT: normal Neck: supple. no JVD. Carotids 2+ bilat; no bruits. No lymphadenopathy or thryomegaly appreciated. Cor: PMI nondisplaced. Regular rate & rhythm. No rubs, gallops or murmurs. Lungs: clear Abdomen: soft, nontender, nondistended. No hepatosplenomegaly. No bruits or masses. Good bowel sounds. Extremities: no cyanosis, clubbing, rash, edema Neuro: alert & oriented x 3, cranial nerves grossly intact. moves all 4 extremities w/o difficulty. Affect pleasant.  ECG: Atrial fibrillation with ventricular rate about 80 bpm nonspecific ST-T changes  Results for orders placed or performed during the hospital encounter of 10/24/22 (from the past 24 hour(s))  TSH     Status: None   Collection Time: 10/24/22  2:20 PM  Result Value Ref Range   TSH 1.021 0.350 - 4.500 uIU/mL  Magnesium     Status: None   Collection Time: 10/24/22  2:20 PM  Result Value Ref Range   Magnesium 2.4 1.7 - 2.4 mg/dL  Resp Panel by RT-PCR (Flu A&B, Covid) Anterior Nasal Swab     Status: None   Collection Time: 10/24/22  3:52 PM   Specimen: Anterior Nasal Swab  Result Value Ref Range   SARS Coronavirus 2 by RT PCR NEGATIVE NEGATIVE   Influenza A by PCR NEGATIVE NEGATIVE   Influenza B by PCR NEGATIVE NEGATIVE  HIV Antibody (routine testing w rflx)     Status: None   Collection Time: 10/25/22  4:26 AM  Result Value Ref Range   HIV Screen 4th Generation wRfx  Non Reactive Non Reactive  Magnesium     Status: None   Collection Time: 10/25/22  4:26 AM  Result Value Ref Range   Magnesium 2.3 1.7 - 2.4 mg/dL  Basic metabolic panel     Status: Abnormal   Collection Time: 10/25/22  4:26 AM  Result Value  Ref Range   Sodium 138 135 - 145 mmol/L   Potassium 4.1 3.5 - 5.1 mmol/L   Chloride 106 98 - 111 mmol/L   CO2 24 22 - 32 mmol/L   Glucose, Bld 131 (H) 70 - 99 mg/dL   BUN 18 8 - 23 mg/dL   Creatinine, Ser 0.71 0.44 - 1.00 mg/dL   Calcium 8.9 8.9 - 10.3 mg/dL   GFR, Estimated >60 >60 mL/min   Anion gap 8 5 - 15  CBC with Differential/Platelet     Status: Abnormal   Collection Time: 10/25/22  4:26 AM  Result Value Ref Range   WBC 12.6 (H) 4.0 - 10.5 K/uL   RBC 4.55 3.87 - 5.11 MIL/uL   Hemoglobin 13.9 12.0 - 15.0 g/dL   HCT 41.3 36.0 - 46.0 %   MCV 90.8 80.0 - 100.0 fL   MCH 30.5 26.0 - 34.0 pg   MCHC 33.7 30.0 - 36.0 g/dL   RDW 13.2 11.5 - 15.5 %   Platelets 281 150 - 400 K/uL   nRBC 0.0 0.0 - 0.2 %   Neutrophils Relative % 85 %   Neutro Abs 10.6 (H) 1.7 - 7.7 K/uL   Lymphocytes Relative 9 %   Lymphs Abs 1.1 0.7 - 4.0 K/uL   Monocytes Relative 5 %   Monocytes Absolute 0.7 0.1 - 1.0 K/uL   Eosinophils Relative 0 %   Eosinophils Absolute 0.0 0.0 - 0.5 K/uL   Basophils Relative 0 %   Basophils Absolute 0.0 0.0 - 0.1 K/uL   Immature Granulocytes 1 %   Abs Immature Granulocytes 0.15 (H) 0.00 - 0.07 K/uL   DG Chest 2 View  Result Date: 10/24/2022 CLINICAL DATA:  Cough and congestion.  History of COPD. EXAM: CHEST - 2 VIEW COMPARISON:  12/31/2021. FINDINGS: Cardiac silhouette is normal in size and configuration. No mediastinal or hilar masses. No evidence of adenopathy. Clear lungs.  No pleural effusion or pneumothorax. Skeletal structures are intact. IMPRESSION: No active cardiopulmonary disease. Electronically Signed   By: Lajean Manes M.D.   On: 10/24/2022 11:26     ASSESSMENT AND PLAN: Atrial fibrillation with rapid ventricular  response rate.  Agree with tapering off IV Cardizem and switching to p.o. Cardizem and metoprolol.  Agree with starting the Eliquis.  Patient appears to be feeling much better with treatment of COPD exacerbation.  We will continue to follow the patient with you.  Thank you very much for referral.  Nettie Cromwell A

## 2022-10-25 NOTE — Progress Notes (Signed)
  Progress Note   Patient: Sabrina Hudson ZOX:[id number] DOB: Oct 29, 1951 DOA: 10/24/2022     0 DOS: the patient was seen and examined on 10/25/2022     Assessment and Plan: * COPD exacerbation (HCC) Continue Solu-Medrol 40 mg daily.  Continue nebulizer treatments and inhalers.  Still wheezing today.  Reassess tomorrow.  Atrial fibrillation with RVR ([place]) Patient was on Cardizem drip this morning.  After restarting the increased dose of oral Cardizem and metoprolol we were able to come off the drip this afternoon.  Eliquis started.  Smoking Nicotine patch  OSA (obstructive sleep apnea) CPAP at bedtime  Depression On Cymbalta  GERD without esophagitis Continue PPI  Pure hypercholesterolemia On Zetia  HTN (hypertension) On metoprolol and Cardizem CD        Subjective: Patient feeling better but still does not feel ready to get out of the hospital yet.  Still with some shortness of breath and cough and wheeze.  This morning was on Cardizem drip.  Admitted with COPD exacerbation and rapid atrial fibrillation  Physical Exam: Vitals:   10/25/22 0245 10/25/22 0519 10/25/22 0759 10/25/22 1121  BP:  (!) 147/84 132/75 (!) 142/80  Pulse:  73 69 70  Resp: '14 20 17 18  '$ Temp:  98.3 F (36.8 C) 97.8 F (36.6 C) 98.1 F (36.7 C)  TempSrc:      SpO2: 95% 97% 96% 98%  Weight:      Height:       Physical Exam HENT:     Head: Normocephalic.     Mouth/Throat:     Pharynx: No oropharyngeal exudate.  Eyes:     General: Lids are normal.     Conjunctiva/sclera: Conjunctivae normal.  Cardiovascular:     Rate and Rhythm: Normal rate. Rhythm irregularly irregular.     Heart sounds: Normal heart sounds, S1 normal and S2 normal.  Pulmonary:     Breath sounds: Examination of the right-middle field reveals decreased breath sounds and wheezing. Examination of the left-middle field reveals decreased breath sounds and wheezing. Examination of the right-lower field reveals decreased  breath sounds and wheezing. Examination of the left-lower field reveals decreased breath sounds and wheezing. Decreased breath sounds and wheezing present.  Abdominal:     Palpations: Abdomen is soft.     Tenderness: There is no abdominal tenderness.  Musculoskeletal:     Right lower leg: No swelling.     Left lower leg: No swelling.  Skin:    General: Skin is warm.     Findings: No rash.  Neurological:     Mental Status: She is alert and oriented to person, place, and time.     Data Reviewed: White blood cell count 12.6, creatinine 0.71   Disposition: Status is: Inpatient Remains inpatient appropriate because: Still requiring IV Solu-Medrol for bronchospasm and COPD exacerbation.  Tapered off Cardizem drip today  Planned Discharge Destination: Home    Time spent: 28 minutes  Author: Loletha Grayer, MD 10/25/2022 1:09 PM  For on call review [email or web address].

## 2022-10-25 NOTE — Assessment & Plan Note (Signed)
Continue PPI ?

## 2022-10-26 ENCOUNTER — Other Ambulatory Visit (HOSPITAL_COMMUNITY): Payer: Self-pay

## 2022-10-26 ENCOUNTER — Inpatient Hospital Stay
Admit: 2022-10-26 | Discharge: 2022-10-26 | Disposition: A | Discharge status: Home / Self Care | Payer: Medicare Other | Attending: Internal Medicine | Admitting: Internal Medicine

## 2022-10-26 DIAGNOSIS — F17200 Nicotine dependence, unspecified, uncomplicated: Secondary | ICD-10-CM | POA: Diagnosis not present

## 2022-10-26 DIAGNOSIS — J441 Chronic obstructive pulmonary disease with (acute) exacerbation: Secondary | ICD-10-CM | POA: Diagnosis not present

## 2022-10-26 DIAGNOSIS — I4891 Unspecified atrial fibrillation: Secondary | ICD-10-CM | POA: Diagnosis not present

## 2022-10-26 DIAGNOSIS — G4733 Obstructive sleep apnea (adult) (pediatric): Secondary | ICD-10-CM | POA: Diagnosis not present

## 2022-10-26 MED ORDER — DILTIAZEM HCL ER COATED BEADS 240 MG PO CP24: 240.0000 mg | ORAL_CAPSULE | Freq: Every day | ORAL | 0 refills | Status: AC

## 2022-10-26 MED ORDER — APIXABAN 5 MG PO TABS: 5.0000 mg | ORAL_TABLET | Freq: Two times a day (BID) | ORAL | 0 refills | Status: AC

## 2022-10-26 MED ORDER — UMECLIDINIUM-VILANTEROL 62.5-25 MCG/ACT IN AEPB: 1.0000 | INHALATION_SPRAY | Freq: Every day | RESPIRATORY_TRACT | 0 refills | Status: DC

## 2022-10-26 MED ORDER — PREDNISONE 20 MG PO TABS: ORAL_TABLET | ORAL | 0 refills | Status: DC

## 2022-10-26 MED ORDER — NICOTINE 7 MG/24HR TD PT24: MEDICATED_PATCH | TRANSDERMAL | 0 refills | Status: AC

## 2022-10-26 NOTE — Progress Notes (Signed)
*  PRELIMINARY RESULTS* Echocardiogram 2D Echocardiogram has been performed.  Sabrina Hudson 10/26/2022, 9:42 AM

## 2022-10-26 NOTE — Discharge Summary (Signed)
Physician Discharge Summary   Patient: Sabrina Hudson MRN: [id number] DOB: 04/04/1951  Admit date:     10/24/2022  Discharge date: 10/26/22  Discharge Physician: Loletha Grayer   PCP: Derinda Late, MD   Recommendations at discharge:   Follow-up PCP 5 days Follow-up cardiology 2 weeks  Discharge Diagnoses: Principal Problem:   COPD exacerbation ([place]) Active Problems:   Atrial fibrillation with rapid ventricular response (HCC)   Smoking   HTN (hypertension)   Pure hypercholesterolemia   GERD without esophagitis   Depression   OSA (obstructive sleep apnea)   Hospital Course: The patient was admitted to the hospital on 10/24/2022 and discharged on 10/26/2022.  Patient came in with irregular heartbeat and shortness of breath.  Patient was found to be in rapid atrial fibrillation and started on Cardizem drip.  Patient was also started on steroids for COPD exacerbation.  The patient was tapered off Cardizem drip on 10/25/2022 and placed on oral meds.  She was also placed back on Eliquis.  On discharge her lungs have improved with regards to better air entry and less wheeze.  Patient felt well enough to go home and discharged home in stable condition.  Assessment and Plan: * COPD exacerbation ([place]) The patient received Solu-Medrol 40 mg daily while in the hospital.  Prescribed Anoro inhaler and albuterol inhaler upon discharge.  Also prescribed prednisone for few more days.  Atrial fibrillation with rapid ventricular response ([place]) Patient was on Cardizem drip on admission.  Discharged home on increased dose of Cardizem CD and metoprolol and Eliquis.  Risk of blood thinner explained to patient and patient's daughter at the bedside.  Echocardiogram was done before discharge but not read at the time of dictation.  Smoking Nicotine patch  OSA (obstructive sleep apnea) CPAP at bedtime  Depression On Cymbalta  GERD without esophagitis Continue PPI  Pure  hypercholesterolemia On Zetia  HTN (hypertension) On metoprolol and Cardizem CD         Consultants: Cardiology Procedures performed: None Disposition: Home Diet recommendation:  Cardiac diet DISCHARGE MEDICATION: Allergies as of 10/26/2022       Reactions   Bupropion Other (See Comments)   Bad Dreams, Depression, Confusion   Gabapentin    Other reaction(s): Other (See Comments) Confusion   Simvastatin Other (See Comments)   Muscle pain        Medication List     STOP taking these medications    docusate sodium 100 MG capsule Commonly known as: COLACE       TAKE these medications    acetaminophen 500 MG tablet Commonly known as: TYLENOL Take 500 mg by mouth every 6 (six) hours as needed for moderate pain.   apixaban 5 MG Tabs tablet Commonly known as: ELIQUIS Take 1 tablet (5 mg total) by mouth 2 (two) times daily.   diltiazem 240 MG 24 hr capsule Commonly known as: CARDIZEM CD Take 1 capsule (240 mg total) by mouth daily. What changed:  medication strength how much to take   diltiazem 30 MG tablet Commonly known as: CARDIZEM Take 30 mg by mouth daily as needed (heart rate/pulse increase to greater than 100.).   DULoxetine 60 MG capsule Commonly known as: CYMBALTA Take 60 mg by mouth daily.   ezetimibe 10 MG tablet Commonly known as: ZETIA Take 10 mg by mouth daily.   fluticasone 50 MCG/ACT nasal spray Commonly known as: FLONASE Place 1 spray into both nostrils daily as needed for allergies or rhinitis.   LORazepam 0.5  MG tablet Commonly known as: ATIVAN Take 0.5 mg by mouth 2 (two) times daily as needed for anxiety.   metoprolol tartrate 25 MG tablet Commonly known as: LOPRESSOR Take 25 mg by mouth 2 (two) times daily.   nicotine 7 mg/24hr patch Commonly known as: NICODERM CQ - dosed in mg/24 hr One '7mg'$  patch chest wall daily (okay to substitute generic)   omeprazole 40 MG capsule Commonly known as: PRILOSEC Take 40 mg by  mouth daily.   predniSONE 20 MG tablet Commonly known as: DELTASONE Take two tabs po for three days Start taking on: October 27, 2022   umeclidinium-vilanterol 62.5-25 MCG/ACT Aepb Commonly known as: ANORO ELLIPTA Inhale 1 puff into the lungs daily.        Follow-up Information     Derinda Late, MD. Schedule an appointment as soon as possible for a visit in 5 day(s).   Specialty: Family Medicine Why: Dr. Elzie Rings nurse will call you directly to schedule this appointment. If you have not heard from them within 24 hours, please call them at the number listed above. Contact information: [address] [facility] [place] [address] [phone]         Corey Skains, MD. Go in 2 week(s).   Specialty: Cardiology Why: Appointment on Thursday, 11/16/203 at 3:00pm. Contact information: [address] [place] [address] [facility] [place] [address] [phone]                Discharge Exam: Danley Danker Weights   10/24/22 1000  Weight: 83.9 kg   Physical Exam HENT:     Head: Normocephalic.     Mouth/Throat:     Pharynx: No oropharyngeal exudate.  Eyes:     General: Lids are normal.     Conjunctiva/sclera: Conjunctivae normal.  Cardiovascular:     Rate and Rhythm: Normal rate. Rhythm irregularly irregular.     Heart sounds: Normal heart sounds, S1 normal and S2 normal.  Pulmonary:     Breath sounds: Examination of the right-lower field reveals decreased breath sounds and wheezing. Examination of the left-lower field reveals decreased breath sounds and wheezing. Decreased breath sounds and wheezing present.  Abdominal:     Palpations: Abdomen is soft.     Tenderness: There is no abdominal tenderness.  Musculoskeletal:     Right lower leg: No swelling.     Left lower leg: No swelling.  Skin:    General: Skin is warm.     Findings: No rash.  Neurological:     Mental Status: She is alert and oriented  to person, place, and time.      Condition at discharge: stable  The results of significant diagnostics from this hospitalization (including imaging, microbiology, ancillary and laboratory) are listed below for reference.   Imaging Studies: DG Chest 2 View  Result Date: 10/24/2022 CLINICAL DATA:  Cough and congestion.  History of COPD. EXAM: CHEST - 2 VIEW COMPARISON:  12/31/2021. FINDINGS: Cardiac silhouette is normal in size and configuration. No mediastinal or hilar masses. No evidence of adenopathy. Clear lungs.  No pleural effusion or pneumothorax. Skeletal structures are intact. IMPRESSION: No active cardiopulmonary disease. Electronically Signed   By: Lajean Manes M.D.   On: 10/24/2022 11:26    Microbiology: Results for orders placed or performed during the hospital encounter of 10/24/22  Resp Panel by RT-PCR (Flu A&B, Covid) Anterior Nasal Swab     Status: None   Collection Time: 10/24/22  3:52  PM   Specimen: Anterior Nasal Swab  Result Value Ref Range Status   SARS Coronavirus 2 by RT PCR NEGATIVE NEGATIVE Final    Comment: (NOTE) SARS-CoV-2 target nucleic acids are NOT DETECTED.  The SARS-CoV-2 RNA is generally detectable in upper respiratory specimens during the acute phase of infection. The lowest concentration of SARS-CoV-2 viral copies this assay can detect is 138 copies/mL. A negative result does not preclude SARS-Cov-2 infection and should not be used as the sole basis for treatment or other patient management decisions. A negative result may occur with  improper specimen collection/handling, submission of specimen other than nasopharyngeal swab, presence of viral mutation(s) within the areas targeted by this assay, and inadequate number of viral copies(<138 copies/mL). A negative result must be combined with clinical observations, patient history, and epidemiological information. The expected result is Negative.  Fact Sheet for Patients:   EntrepreneurPulse.com.au  Fact Sheet for Healthcare Providers:  IncredibleEmployment.be  This test is no t yet approved or cleared by the [place] FDA and  has been authorized for detection and/or diagnosis of SARS-CoV-2 by FDA under an Emergency Use Authorization (EUA). This EUA will remain  in effect (meaning this test can be used) for the duration of the COVID-19 declaration under Section 564(b)(1) of the Act, 21 U.S.C.section 360bbb-3(b)(1), unless the authorization is terminated  or revoked sooner.       Influenza A by PCR NEGATIVE NEGATIVE Final   Influenza B by PCR NEGATIVE NEGATIVE Final    Comment: (NOTE) The Xpert Xpress SARS-CoV-2/FLU/RSV plus assay is intended as an aid in the diagnosis of influenza from Nasopharyngeal swab specimens and should not be used as a sole basis for treatment. Nasal washings and aspirates are unacceptable for Xpert Xpress SARS-CoV-2/FLU/RSV testing.  Fact Sheet for Patients: EntrepreneurPulse.com.au  Fact Sheet for Healthcare Providers: IncredibleEmployment.be  This test is not yet approved or cleared by the [place] FDA and has been authorized for detection and/or diagnosis of SARS-CoV-2 by FDA under an Emergency Use Authorization (EUA). This EUA will remain in effect (meaning this test can be used) for the duration of the COVID-19 declaration under Section 564(b)(1) of the Act, 21 U.S.C. section 360bbb-3(b)(1), unless the authorization is terminated or revoked.  Performed at [facility], [address] [place], [place] [address]     Labs: CBC: Recent Labs  Lab 10/24/22 1015 10/25/22 0426  WBC 15.5* 12.6*  NEUTROABS  --  10.6*  HGB 14.8 13.9  HCT 45.9 41.3  MCV 95.2 90.8  PLT 294 062   Basic Metabolic Panel: Recent Labs  Lab 10/24/22 1015 10/24/22 1420 10/25/22 0426  NA 141  --  138  K 3.7  --  4.1  CL 108  --  106  CO2  23  --  24  GLUCOSE 90  --  131*  BUN 13  --  18  CREATININE 0.73  --  0.71  CALCIUM 8.9  --  8.9  MG  --  2.4 2.3   Liver Function Tests: Recent Labs  Lab 10/24/22 1015  AST 34  ALT 41  ALKPHOS 105  BILITOT 0.6  PROT 6.9  ALBUMIN 3.9    Discharge time spent: greater than 30 minutes.  Signed: Loletha Grayer, MD [facility] 10/26/2022

## 2022-10-26 NOTE — Progress Notes (Signed)
Patient alert and oriented, vss, no complaints of pain.  Escorted out of hospital via wheelchair by volunteers.  D/c telemetry and piv.

## 2022-10-26 NOTE — Plan of Care (Signed)

## 2022-10-28 LAB — ECHOCARDIOGRAM COMPLETE
AR max vel: 2.66 cm2
AV Area VTI: 2.55 cm2
AV Area mean vel: 2.41 cm2
AV Mean grad: 3 mmHg
AV Peak grad: 6.1 mmHg
Ao pk vel: 1.23 m/s
Area-P 1/2: 5.62 cm2
Calc EF: 59.6 %
Height: 66 in
Single Plane A2C EF: 59.8 %
Single Plane A4C EF: 60 %
Weight: 2960 oz

## 2023-02-17 ENCOUNTER — Other Ambulatory Visit: Payer: Self-pay | Admitting: Family Medicine

## 2023-02-17 DIAGNOSIS — Z1231 Encounter for screening mammogram for malignant neoplasm of breast: Secondary | ICD-10-CM

## 2023-03-10 ENCOUNTER — Ambulatory Visit
Admission: RE | Admit: 2023-03-10 | Discharge: 2023-03-10 | Disposition: A | Discharge status: Home / Self Care | Payer: Medicare Other | Source: Ambulatory Visit | Attending: Family Medicine | Admitting: Family Medicine

## 2023-03-10 DIAGNOSIS — Z1231 Encounter for screening mammogram for malignant neoplasm of breast: Secondary | ICD-10-CM

## 2023-03-12 ENCOUNTER — Encounter: Payer: Self-pay | Admitting: Family Medicine

## 2023-03-15 ENCOUNTER — Other Ambulatory Visit: Payer: Self-pay | Admitting: Family Medicine

## 2023-03-15 DIAGNOSIS — N6489 Other specified disorders of breast: Secondary | ICD-10-CM

## 2023-03-15 DIAGNOSIS — R928 Other abnormal and inconclusive findings on diagnostic imaging of breast: Secondary | ICD-10-CM

## 2023-03-17 ENCOUNTER — Ambulatory Visit
Admission: RE | Admit: 2023-03-17 | Discharge: 2023-03-17 | Disposition: A | Discharge status: Home / Self Care | Payer: Medicare Other | Source: Ambulatory Visit | Attending: Family Medicine | Admitting: Family Medicine

## 2023-03-17 DIAGNOSIS — R928 Other abnormal and inconclusive findings on diagnostic imaging of breast: Secondary | ICD-10-CM | POA: Diagnosis not present

## 2023-03-17 DIAGNOSIS — N6489 Other specified disorders of breast: Secondary | ICD-10-CM | POA: Diagnosis present

## 2023-03-19 ENCOUNTER — Other Ambulatory Visit: Payer: Self-pay | Admitting: Family Medicine

## 2023-03-19 DIAGNOSIS — N6489 Other specified disorders of breast: Secondary | ICD-10-CM

## 2023-03-19 DIAGNOSIS — R928 Other abnormal and inconclusive findings on diagnostic imaging of breast: Secondary | ICD-10-CM

## 2023-03-30 ENCOUNTER — Ambulatory Visit
Admission: RE | Admit: 2023-03-30 | Discharge: 2023-03-30 | Disposition: A | Discharge status: Home / Self Care | Payer: Medicare Other | Source: Ambulatory Visit | Attending: Family Medicine | Admitting: Family Medicine

## 2023-03-30 DIAGNOSIS — N6489 Other specified disorders of breast: Secondary | ICD-10-CM

## 2023-03-30 DIAGNOSIS — R928 Other abnormal and inconclusive findings on diagnostic imaging of breast: Secondary | ICD-10-CM | POA: Diagnosis not present

## 2023-03-30 HISTORY — PX: BREAST BIOPSY: SHX20

## 2023-03-30 MED ORDER — LIDOCAINE-EPINEPHRINE 1 %-1:100000 IJ SOLN
20.0000 mL | Freq: Once | INTRAMUSCULAR | Status: AC
  Administered 2023-03-30: 20 mL

## 2023-03-30 MED ORDER — LIDOCAINE HCL (PF) 1 % IJ SOLN
5.0000 mL | Freq: Once | INTRAMUSCULAR | Status: AC
  Administered 2023-03-30: 5 mL

## 2023-03-31 LAB — SURGICAL PATHOLOGY

## 2023-06-16 ENCOUNTER — Emergency Department: Payer: Medicare Other

## 2023-06-16 ENCOUNTER — Emergency Department
Admission: EM | Admit: 2023-06-16 | Discharge: 2023-06-16 | Disposition: A | Discharge status: Home / Self Care | Payer: Medicare Other | Attending: Emergency Medicine | Admitting: Emergency Medicine

## 2023-06-16 DIAGNOSIS — R079 Chest pain, unspecified: Secondary | ICD-10-CM

## 2023-06-16 DIAGNOSIS — I160 Hypertensive urgency: Secondary | ICD-10-CM | POA: Insufficient documentation

## 2023-06-16 DIAGNOSIS — Z79899 Other long term (current) drug therapy: Secondary | ICD-10-CM | POA: Diagnosis not present

## 2023-06-16 DIAGNOSIS — J449 Chronic obstructive pulmonary disease, unspecified: Secondary | ICD-10-CM | POA: Insufficient documentation

## 2023-06-16 DIAGNOSIS — I10 Essential (primary) hypertension: Secondary | ICD-10-CM | POA: Insufficient documentation

## 2023-06-16 DIAGNOSIS — R0789 Other chest pain: Secondary | ICD-10-CM | POA: Diagnosis present

## 2023-06-16 LAB — BASIC METABOLIC PANEL
Anion gap: 11 (ref 5–15)
BUN: 12 mg/dL (ref 8–23)
CO2: 24 mmol/L (ref 22–32)
Calcium: 8.8 mg/dL — ABNORMAL LOW (ref 8.9–10.3)
Chloride: 104 mmol/L (ref 98–111)
Creatinine, Ser: 0.72 mg/dL (ref 0.44–1.00)
GFR, Estimated: >60 mL/min (ref >60)
Glucose, Bld: 99 mg/dL (ref 70–99)
Potassium: 3.4 mmol/L — ABNORMAL LOW (ref 3.5–5.1)
Sodium: 139 mmol/L (ref 135–145)

## 2023-06-16 LAB — CBC
HCT: 47 % — ABNORMAL HIGH (ref 36.0–46.0)
Hemoglobin: 15.5 g/dL — ABNORMAL HIGH (ref 12.0–15.0)
MCH: 30.5 pg (ref 26.0–34.0)
MCHC: 33 g/dL (ref 30.0–36.0)
MCV: 92.5 fL (ref 80.0–100.0)
Platelets: 269 10*3/uL (ref 150–400)
RBC: 5.08 MIL/uL (ref 3.87–5.11)
RDW: 13.3 % (ref 11.5–15.5)
WBC: 9.2 10*3/uL (ref 4.0–10.5)
nRBC: 0 % (ref 0.0–0.2)

## 2023-06-16 LAB — TROPONIN I (HIGH SENSITIVITY): Troponin I (High Sensitivity): 4 ng/L (ref <18)

## 2023-06-16 MED ORDER — POTASSIUM CHLORIDE CRYS ER 20 MEQ PO TBCR
20.0000 meq | EXTENDED_RELEASE_TABLET | Freq: Once | ORAL | Status: AC
  Administered 2023-06-16: 20 meq via ORAL
  Filled 2023-06-16: qty 1

## 2023-06-16 NOTE — ED Triage Notes (Signed)
EMS brings pt in from home for c/o CP and [facility]

## 2023-06-16 NOTE — Discharge Instructions (Addendum)
You were seen in the Emergency Department today for evaluation of your chest pain. Fortunately, your labs, EKG, and chest x-Deshawnda Acrey were overall reassuring against a emergency cause for your pain. Please follow-up with your primary doctor within the next few days for reevaluation.Return to the ER for any new or worsening symptoms including worsening chest pain, difficulty breathing, or any other new or concerning symptoms that you believe warrants immediate attention.

## 2023-06-16 NOTE — ED Notes (Signed)
Full rainbow obtained and sent to lab at this time.

## 2023-06-16 NOTE — ED Notes (Signed)
No repeat trop per MD.

## 2023-06-16 NOTE — ED Provider Notes (Signed)
[facility] Provider Note    Event Date/Time   First MD Initiated Contact with Patient 06/16/23 2153     (approximate)   History   Chest Pain   HPI  Sabrina Hudson is a 72 y.o. female with history of A-fib, COPD, HTN presenting to the ER for evaluation of high blood pressure.  She saw her primary care doctor yesterday.  Her losartan was increased.  Today, she noticed that her blood pressure was uptrending to a systolic of 190.  In the setting of this, she did have a brief episode of chest pain described as tightness as well as shortness of breath and dizziness.  Symptoms began around 2 PM.  She reports that the symptoms have all resolved currently.  Her blood pressure not previously been not high so she got concerned and called EMS.  Received 324 of aspirin with EMS.  Currently denies any complaints.     Physical Exam   Triage Vital Signs: ED Triage Vitals  Enc Vitals Group     BP 06/16/23 2006 (!) 159/88     Pulse Rate 06/16/23 2006 70     Resp 06/16/23 2006 18     Temp 06/16/23 2006 98.5 F (36.9 C)     Temp Source 06/16/23 2006 Oral     SpO2 06/16/23 2005 92 %     Weight --      Height --      Head Circumference --      Peak Flow --      Pain Score 06/16/23 2023 5     Pain Loc --      Pain Edu? --      Excl. in GC? --     Most recent vital signs: Vitals:   06/16/23 2230 06/16/23 2300  BP: (!) 146/88 (!) 157/81  Pulse: 62 (!) 54  Resp: 15 14  Temp:    SpO2: 96%      General: Awake, interactive  CV:  Regular rate, good peripheral perfusion.  Resp:  Lungs clear, unlabored respirations.  Abd:  Soft, nondistended.  Neuro:  Symmetric facial movement, 5-5 strength in the bilateral upper and lower extremities, normal sensation, normal finger-nose testing, fluid speech   ED Results / Procedures / Treatments   Labs (all labs ordered are listed, but only abnormal results are displayed) Labs Reviewed  BASIC METABOLIC PANEL -  Abnormal; Notable for the following components:      Result Value   Potassium 3.4 (*)    Calcium 8.8 (*)    All other components within normal limits  CBC - Abnormal; Notable for the following components:   Hemoglobin 15.5 (*)    HCT 47.0 (*)    All other components within normal limits  TROPONIN I (HIGH SENSITIVITY)  TROPONIN I (HIGH SENSITIVITY)     EKG EKG independently reviewed interpreted by myself (ER attending) demonstrates:  EKG demonstrates normal sinus rhythm at a rate of 69, PR 176, QRS 92, QTc 469, no acute ST changes  RADIOLOGY Imaging independently reviewed and interpreted by myself demonstrates:  CXR without focal pneumonia  PROCEDURES:  Critical Care performed: No  Procedures   MEDICATIONS ORDERED IN ED: Medications  potassium chloride SA (KLOR-CON M) CR tablet 20 mEq (20 mEq Oral Given 06/16/23 2314)     IMPRESSION / MDM / ASSESSMENT AND PLAN / ED COURSE  I reviewed the triage vital signs and the nursing notes.  Differential diagnosis includes, but is not limited  to, hypertensive to the hypertensive emergency, ACS, low suspicion PE, aortic dissection based on clinical history  Patient's presentation is most consistent with acute presentation with potential threat to life or bodily function.  72 year old female presenting with episode of elevated blood pressure with now resolved chest pain and shortness of breath.  Her initial workup here is overall reassuring.  Slightly low potassium, orally repleted.  Initial troponin negative.  I did discuss obtaining a repeat troponin, but patient did prefer to hold off on this as her chest pain has resolved.  Initial obtained greater than 3 hours from onset of symptoms so I do not think this is unreasonable.  I did discuss that I do not expect her blood pressure to resolve immediately as she does started her blood pressure medication yesterday.  She will keep a log to take to her primary care doctor.  She is comfortable  discharge home.  Strict return precautions provided.      FINAL CLINICAL IMPRESSION(S) / ED DIAGNOSES   Final diagnoses:  Hypertensive urgency  Nonspecific chest pain     Rx / DC Orders   ED Discharge Orders     None        Note:  This document was prepared using Dragon voice recognition software and may include unintentional dictation errors.   Trinna Post, MD 06/17/23 [phone]

## 2023-06-16 NOTE — ED Triage Notes (Addendum)
Pt comes by EMS for CP with dizziness, SOB that started today. Pt states that she saw her doctor yesterday and he increased her BP meds yesterday, reports today her BP was very high. PTA pt received 324 ASA

## 2024-02-18 ENCOUNTER — Other Ambulatory Visit: Payer: Self-pay

## 2024-02-18 ENCOUNTER — Encounter: Payer: Self-pay | Admitting: Internal Medicine

## 2024-02-18 ENCOUNTER — Emergency Department: Payer: Medicare Other

## 2024-02-18 ENCOUNTER — Observation Stay
Admission: EM | Admit: 2024-02-18 | Discharge: 2024-02-19 | Disposition: A | Discharge status: Home / Self Care | Payer: Medicare Other | Attending: Emergency Medicine | Admitting: Emergency Medicine

## 2024-02-18 DIAGNOSIS — I10 Essential (primary) hypertension: Secondary | ICD-10-CM | POA: Diagnosis not present

## 2024-02-18 DIAGNOSIS — Z8616 Personal history of COVID-19: Secondary | ICD-10-CM | POA: Diagnosis not present

## 2024-02-18 DIAGNOSIS — G4733 Obstructive sleep apnea (adult) (pediatric): Secondary | ICD-10-CM | POA: Diagnosis present

## 2024-02-18 DIAGNOSIS — J441 Chronic obstructive pulmonary disease with (acute) exacerbation: Principal | ICD-10-CM | POA: Insufficient documentation

## 2024-02-18 DIAGNOSIS — R0789 Other chest pain: Secondary | ICD-10-CM | POA: Diagnosis present

## 2024-02-18 DIAGNOSIS — Z96641 Presence of right artificial hip joint: Secondary | ICD-10-CM | POA: Diagnosis not present

## 2024-02-18 DIAGNOSIS — Z79899 Other long term (current) drug therapy: Secondary | ICD-10-CM | POA: Insufficient documentation

## 2024-02-18 DIAGNOSIS — I4891 Unspecified atrial fibrillation: Secondary | ICD-10-CM | POA: Diagnosis present

## 2024-02-18 DIAGNOSIS — J1089 Influenza due to other identified influenza virus with other manifestations: Secondary | ICD-10-CM | POA: Insufficient documentation

## 2024-02-18 DIAGNOSIS — Z1152 Encounter for screening for COVID-19: Secondary | ICD-10-CM | POA: Diagnosis not present

## 2024-02-18 DIAGNOSIS — K219 Gastro-esophageal reflux disease without esophagitis: Secondary | ICD-10-CM | POA: Diagnosis not present

## 2024-02-18 DIAGNOSIS — Z87891 Personal history of nicotine dependence: Secondary | ICD-10-CM | POA: Insufficient documentation

## 2024-02-18 DIAGNOSIS — J449 Chronic obstructive pulmonary disease, unspecified: Secondary | ICD-10-CM | POA: Diagnosis present

## 2024-02-18 DIAGNOSIS — F32A Depression, unspecified: Secondary | ICD-10-CM | POA: Diagnosis present

## 2024-02-18 DIAGNOSIS — J101 Influenza due to other identified influenza virus with other respiratory manifestations: Secondary | ICD-10-CM | POA: Diagnosis not present

## 2024-02-18 DIAGNOSIS — Z7901 Long term (current) use of anticoagulants: Secondary | ICD-10-CM | POA: Diagnosis not present

## 2024-02-18 DIAGNOSIS — R059 Cough, unspecified: Secondary | ICD-10-CM | POA: Insufficient documentation

## 2024-02-18 LAB — RESP PANEL BY RT-PCR (RSV, FLU A&B, COVID)  RVPGX2
Influenza A by PCR: POSITIVE — AB
Influenza B by PCR: NEGATIVE
Resp Syncytial Virus by PCR: NEGATIVE
SARS Coronavirus 2 by RT PCR: NEGATIVE

## 2024-02-18 LAB — BASIC METABOLIC PANEL
Anion gap: 11 (ref 5–15)
BUN: 13 mg/dL (ref 8–23)
CO2: 25 mmol/L (ref 22–32)
Calcium: 9 mg/dL (ref 8.9–10.3)
Chloride: 100 mmol/L (ref 98–111)
Creatinine, Ser: 0.81 mg/dL (ref 0.44–1.00)
GFR, Estimated: >60 mL/min (ref >60)
Glucose, Bld: 78 mg/dL (ref 70–99)
Potassium: 3.6 mmol/L (ref 3.5–5.1)
Sodium: 136 mmol/L (ref 135–145)

## 2024-02-18 LAB — CBC
HCT: 43.8 % (ref 36.0–46.0)
Hemoglobin: 14.9 g/dL (ref 12.0–15.0)
MCH: 31.1 pg (ref 26.0–34.0)
MCHC: 34 g/dL (ref 30.0–36.0)
MCV: 91.4 fL (ref 80.0–100.0)
Platelets: 217 10*3/uL (ref 150–400)
RBC: 4.79 MIL/uL (ref 3.87–5.11)
RDW: 13.5 % (ref 11.5–15.5)
WBC: 6.7 10*3/uL (ref 4.0–10.5)
nRBC: 0 % (ref 0.0–0.2)

## 2024-02-18 LAB — APTT: aPTT: 39 s — ABNORMAL HIGH (ref 24–36)

## 2024-02-18 LAB — TROPONIN I (HIGH SENSITIVITY): Troponin I (High Sensitivity): 4 ng/L (ref <18)

## 2024-02-18 MED ORDER — HYDROCOD POLI-CHLORPHE POLI ER 10-8 MG/5ML PO SUER: 5.0000 mL | Freq: Every evening | ORAL | Status: DC | PRN

## 2024-02-18 MED ORDER — METHYLPREDNISOLONE SODIUM SUCC 40 MG IJ SOLR
40.0000 mg | Freq: Every day | INTRAMUSCULAR | Status: AC
  Administered 2024-02-19: 40 mg via INTRAVENOUS
  Filled 2024-02-18: qty 1

## 2024-02-18 MED ORDER — APIXABAN 5 MG PO TABS
5.0000 mg | ORAL_TABLET | Freq: Two times a day (BID) | ORAL | Status: DC
  Administered 2024-02-18 – 2024-02-19 (×2): 5 mg via ORAL
  Filled 2024-02-18 (×2): qty 1

## 2024-02-18 MED ORDER — SENNOSIDES-DOCUSATE SODIUM 8.6-50 MG PO TABS: 1.0000 | ORAL_TABLET | Freq: Every evening | ORAL | Status: DC | PRN

## 2024-02-18 MED ORDER — ONDANSETRON HCL 4 MG PO TABS: 4.0000 mg | ORAL_TABLET | Freq: Four times a day (QID) | ORAL | Status: DC | PRN

## 2024-02-18 MED ORDER — ENOXAPARIN SODIUM 40 MG/0.4ML IJ SOSY
40.0000 mg | PREFILLED_SYRINGE | INTRAMUSCULAR | Status: DC
  Administered 2024-02-18: 40 mg via SUBCUTANEOUS
  Filled 2024-02-18: qty 0.4

## 2024-02-18 MED ORDER — METOPROLOL TARTRATE 25 MG PO TABS
25.0000 mg | ORAL_TABLET | Freq: Two times a day (BID) | ORAL | Status: DC
  Administered 2024-02-18 – 2024-02-19 (×2): 25 mg via ORAL
  Filled 2024-02-18 (×2): qty 1

## 2024-02-18 MED ORDER — PREDNISONE 20 MG PO TABS
60.0000 mg | ORAL_TABLET | Freq: Once | ORAL | Status: AC
Start: 2024-02-18 — End: 2024-02-18
  Administered 2024-02-18: 60 mg via ORAL
  Filled 2024-02-18: qty 3

## 2024-02-18 MED ORDER — GUAIFENESIN 100 MG/5ML PO LIQD
5.0000 mL | Freq: Four times a day (QID) | ORAL | Status: DC | PRN
  Administered 2024-02-18: 5 mL via ORAL
  Filled 2024-02-18: qty 10

## 2024-02-18 MED ORDER — ACETAMINOPHEN 325 MG PO TABS: 650.0000 mg | ORAL_TABLET | Freq: Four times a day (QID) | ORAL | Status: DC | PRN

## 2024-02-18 MED ORDER — LIDOCAINE 5 % EX PTCH
1.0000 | MEDICATED_PATCH | CUTANEOUS | Status: DC
  Filled 2024-02-18: qty 1

## 2024-02-18 MED ORDER — ACETAMINOPHEN 650 MG RE SUPP: 650.0000 mg | Freq: Four times a day (QID) | RECTAL | Status: DC | PRN

## 2024-02-18 MED ORDER — IPRATROPIUM-ALBUTEROL 0.5-2.5 (3) MG/3ML IN SOLN
3.0000 mL | Freq: Four times a day (QID) | RESPIRATORY_TRACT | Status: DC | PRN
  Administered 2024-02-18: 3 mL via RESPIRATORY_TRACT
  Filled 2024-02-18: qty 3

## 2024-02-18 MED ORDER — IPRATROPIUM-ALBUTEROL 0.5-2.5 (3) MG/3ML IN SOLN
3.0000 mL | Freq: Once | RESPIRATORY_TRACT | Status: AC
  Administered 2024-02-18: 3 mL via RESPIRATORY_TRACT
  Filled 2024-02-18: qty 3

## 2024-02-18 MED ORDER — ONDANSETRON HCL 4 MG/2ML IJ SOLN: 4.0000 mg | Freq: Four times a day (QID) | INTRAMUSCULAR | Status: DC | PRN

## 2024-02-18 MED ORDER — HYDRALAZINE HCL 20 MG/ML IJ SOLN: 5.0000 mg | Freq: Four times a day (QID) | INTRAMUSCULAR | Status: DC | PRN

## 2024-02-18 NOTE — Assessment & Plan Note (Signed)
Suspect secondary to influenza type. Check MRSA PCR Status post DuoNebs 2 treatments. DuoNebs every 6 hours as needed for shortness of breath and wheezing ordered on admission.   DuoNebs is not scheduled as patient states that if she takes too many nebulizer treatment, her heart rate to elevate atrial fibrillation Status post prednisone 60 mg p.o. one-time dose; Solu-Medrol 40 mg IV one-time dose ordered for 02/19/2024 Continuous pulse oximetry ordered on admission

## 2024-02-18 NOTE — H&P (Signed)
History and Physical   Sabrina Hudson ZOX:[id number] DOB: 1951-03-30 DOA: 02/18/2024  PCP: Kandyce Rud, MD  Outpatient Specialists: Dr. Gillermo Murdoch clinic cardiology Patient coming from: Home  I have personally briefly reviewed patient's old medical records in [facility] EMR.  Chief Concern: Chest pain, shortness of breath, fever  HPI: Sabrina Hudson is a 73 year old female with history of hypertension, anxiety, COPD, depression, atrial fibrillation suspect paroxysmal, presents emergency department for chief source of chest pain, fever or shortness of breath.  Vitals in the ED showed temperature 98.3, respiration 17, heart rate 70, blood pressure 126/78, SpO2 91% on room air.  Serum sodium is 136, potassium 3.6, chloride 100, bicarb 25, BUN of 13, serum creatinine 0.81, EGFR greater than 60, nonfasting glucose 78, WBC 6.7, hemoglobin 14.9, platelets of 217.  ED treatment: DuoNebs 2 treatments, prednisone 60 mg p.o. one-time dose. ----------------------------------- At bedside, patient is able to tell me her name, age, location, current calendar year.   She reports chest and abdominal achiness everytime she coughed.  She reports the cough is nonproductive.  She denies trauma to her person.  She denies known sick contacts however it did endorse that she went to the grocery store on Saturday and did not wear a mask.  She reports that her cough, body aches, fever started on Tuesday.  She denies trauma to her person.  She denies nausea, vomiting, dysuria, hematuria, diarrhea, blood in her stool.  She denies syncope or loss of consciousness.  Social history: She lives on her own and has 36 year old grandson checking on her. She quit smoking 3 months ago. She denies etoh and recreational drug use. 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  ROS: Constitutional: no weight change, no fever ENT/Mouth: no sore throat, no rhinorrhea Eyes: no eye pain, no  vision changes Cardiovascular: no chest pain, + dyspnea,  no edema, no palpitations Respiratory: + cough, no sputum, no wheezing Gastrointestinal: no nausea, no vomiting, no diarrhea, no constipation Genitourinary: no urinary incontinence, no dysuria, no hematuria Musculoskeletal: no arthralgias, + myalgias Skin: no skin lesions, no pruritus, Neuro: + weakness, no loss of consciousness, no syncope Psych: no anxiety, no depression, + decrease appetite Heme/Lymph: no bruising, no bleeding  ED Course: Discussed with EDP, patient with patient for chief concerns of COPD exacerbation.  Assessment/Plan  Principal Problem:   COPD exacerbation (HCC) Active Problems:   HTN (hypertension)   Atrial fibrillation (HCC)   GERD without esophagitis   Depression   OSA (obstructive sleep apnea)   Influenza A   Musculoskeletal chest pain   Cough   Assessment and Plan:  * COPD exacerbation (HCC) Suspect secondary to influenza type. Check MRSA PCR Status post DuoNebs 2 treatments. DuoNebs every 6 hours as needed for shortness of breath and wheezing ordered on admission.   DuoNebs is not scheduled as patient states that if she takes too many nebulizer treatment, her heart rate to elevate atrial fibrillation Status post prednisone 60 mg p.o. one-time dose; Solu-Medrol 40 mg IV one-time dose ordered for 02/19/2024 Continuous pulse oximetry ordered on admission  Cough Guaifenesin 5 mL every 6 hours as needed to loosen phlegm, 3 days ordered Tussionex p.o. nightly as needed for cough, 2 days ordered  Musculoskeletal chest pain And epigastric and abdominal wall pain secondary to coughing Lidocaine 1-2 patch ordered  Influenza A Droplet precaution Check MRSA PCR  OSA (obstructive sleep apnea) CPAP nightly  Atrial fibrillation (HCC) Patient is currently  wearing Holter monitor outpatient order  HTN (hypertension) Hydralazine 5 mg every 6 hours as needed for SBP greater 165, 5 days  ordered  Chart reviewed.   DVT prophylaxis: Enoxaparin subcutaneous Code Status: Full code Diet: heart healthy Family Communication: No Disposition Plan: Pending clinical course Consults called: none at this time Admission status: Telemetry medical, observation  Past Medical History:  Diagnosis Date   AF (atrial fibrillation) (HCC)    Anxiety    Arthritis    COPD (chronic obstructive pulmonary disease) (HCC)    Depression    GERD (gastroesophageal reflux disease)    Headache    migraine   Hearing impaired person, bilateral    History of 2019 novel coronavirus disease (COVID-19) 01/23/2020   HLD (hyperlipidemia)    Hypertension    Past Surgical History:  Procedure Laterality Date   ABDOMINAL HYSTERECTOMY     BREAST BIOPSY Left 03/30/2023   stereo bx, asymmetry, RIBBON clip-path pending   BREAST BIOPSY Left 03/30/2023   MM LT BREAST BX W LOC DEV 1ST LESION IMAGE BX SPEC STEREO GUIDE 03/30/2023 [facility]   CESAREAN SECTION     x 2   COLONOSCOPY  2011   COLONOSCOPY WITH PROPOFOL N/A 07/30/2021   Procedure: COLONOSCOPY WITH PROPOFOL;  Surgeon: Toledo, Boykin Nearing, MD;  Location: [facility] ENDOSCOPY;  Service: Gastroenterology;  Laterality: N/A;   ESOPHAGOGASTRODUODENOSCOPY ENDOSCOPY  2011   TONSILLECTOMY     TOTAL HIP ARTHROPLASTY Right 07/09/2020   Procedure: RIGHT TOTAL HIP ARTHROPLASTY ANTERIOR APPROACH;  Surgeon: Kennedy Bucker, MD;  Location: [facility] ORS;  Service: Orthopedics;  Laterality: Right;   TUBAL LIGATION     Social History:  reports that she quit smoking about 3 years ago. Her smoking use included cigarettes. She started smoking about 48 years ago. She has a 22.5 pack-year smoking history. She has never used smokeless tobacco. She reports that she does not currently use alcohol. She reports that she does not use drugs.  Allergies  Allergen Reactions   Bupropion Other (See Comments)    Bad Dreams, Depression, Confusion   Gabapentin     Other reaction(s): Other  (See Comments) Confusion   Simvastatin Other (See Comments)    Muscle pain   Family History  Problem Relation Age of Onset   COPD Mother    Parkinson's disease Father    Breast cancer Neg Hx    Family history: Family history reviewed and not pertinent.  Prior to Admission medications   Medication Sig Start Date End Date Taking? Authorizing Provider  acetaminophen (TYLENOL) 500 MG tablet Take 500 mg by mouth every 6 (six) hours as needed for moderate pain.    [provider]  apixaban (ELIQUIS) 5 MG TABS tablet Take 1 tablet (5 mg total) by mouth 2 (two) times daily. 10/26/22   Alford Highland, MD  diltiazem (CARDIZEM CD) 240 MG 24 hr capsule Take 1 capsule (240 mg total) by mouth daily. 10/26/22   Alford Highland, MD  diltiazem (CARDIZEM) 30 MG tablet Take 30 mg by mouth daily as needed (heart rate/pulse increase to greater than 100.).     [provider]  DULoxetine (CYMBALTA) 60 MG capsule Take 60 mg by mouth daily.    [provider]  ezetimibe (ZETIA) 10 MG tablet Take 10 mg by mouth daily.    [provider]  fluticasone (FLONASE) 50 MCG/ACT nasal spray Place 1 spray into both nostrils daily as needed for allergies or rhinitis.    [provider]  LORazepam (ATIVAN)  0.5 MG tablet Take 0.5 mg by mouth 2 (two) times daily as needed for anxiety. 12/11/21   [provider]  metoprolol tartrate (LOPRESSOR) 25 MG tablet Take 25 mg by mouth 2 (two) times daily.    [provider]  nicotine (NICODERM CQ - DOSED IN MG/24 HR) 7 mg/24hr patch One 7mg  patch chest wall daily (okay to substitute generic) 10/26/22   Alford Highland, MD  omeprazole (PRILOSEC) 40 MG capsule Take 40 mg by mouth daily. 02/26/20   [provider]  predniSONE (DELTASONE) 20 MG tablet Take two tabs po for three days 10/27/22   Alford Highland, MD  umeclidinium-vilanterol [facility] ELLIPTA) 62.5-25 MCG/ACT AEPB Inhale 1 puff into the lungs daily.  10/26/22   Alford Highland, MD   Physical Exam: Vitals:   02/18/24 1600 02/18/24 1630 02/18/24 1700 02/18/24 1724  BP: 131/71 112/64 127/60 137/77  Pulse: 80 (!) 57 61 76  Resp: 18 17 15 16   Temp:    98.1 F (36.7 C)  TempSrc:      SpO2: 91% 92% 94% 91%  Weight:      Height:       Constitutional: appears age-appropriate, NAD, calm Eyes: PERRL, lids and conjunctivae normal ENMT: Mucous membranes are moist. Posterior pharynx clear of any exudate or lesions. Age-appropriate dentition. Hearing appropriate Neck: normal, supple, no masses, no thyromegaly Respiratory: clear to auscultation bilaterally, no wheezing, no crackles. Normal respiratory effort. No accessory muscle use.  Cardiovascular: Regular rate and rhythm, no murmurs / rubs / gallops. No extremity edema. 2+ pedal pulses. No carotid bruits.  Abdomen: no tenderness, no masses palpated, no hepatosplenomegaly. Bowel sounds positive.  Musculoskeletal: no clubbing / cyanosis. No joint deformity upper and lower extremities. Good ROM, no contractures, no atrophy. Normal muscle tone.  Skin: no rashes, lesions, ulcers. No induration Neurologic: Sensation intact. Strength 5/5 in all 4.  Psychiatric: Normal judgment and insight. Alert and oriented x 3. Normal mood.   EKG: independently reviewed, showing sinus rhythm with rate of 66, QTc 440  Chest x-ray on Admission: I personally reviewed and I agree with radiologist reading as below.  DG Chest 2 View Result Date: 02/18/2024 CLINICAL DATA:  Chest pain and fever. EXAM: CHEST - 2 VIEW COMPARISON:  Chest radiographs 06/16/2023 FINDINGS: Cardiac silhouette and mediastinal contours are within normal limits. Mild-to-moderate calcification within the aortic arch. Findings the diaphragms and increased AP diameter chest again suggesting COPD. Lungs are clear. No pleural effusion or pneumothorax. Mild-to-moderate multilevel degenerative disc changes of the thoracic spine. Electronic device overlies  the anterior left chest wall. IMPRESSION: 1. No active cardiopulmonary disease. 2. Findings again suggesting COPD. Electronically Signed   By: Neita Garnet M.D.   On: 02/18/2024 13:11   Labs on Admission: I have personally reviewed following labs  CBC: Recent Labs  Lab 02/18/24 1125  WBC 6.7  HGB 14.9  HCT 43.8  MCV 91.4  PLT 217   Basic Metabolic Panel: Recent Labs  Lab 02/18/24 1125  NA 136  K 3.6  CL 100  CO2 25  GLUCOSE 78  BUN 13  CREATININE 0.81  CALCIUM 9.0   GFR: Estimated Creatinine Clearance: 70.5 mL/min (by C-G formula based on SCr of 0.81 mg/dL).  Urine analysis:    Component Value Date/Time   COLORURINE YELLOW (A) 07/05/2020 1028   APPEARANCEUR HAZY (A) 07/05/2020 1028   APPEARANCEUR Hazy 02/26/2014 1833   LABSPEC 1.011 07/05/2020 1028   LABSPEC 1.019 02/26/2014 1833   PHURINE 6.0 07/05/2020 1028  GLUCOSEU NEGATIVE 07/05/2020 1028   GLUCOSEU Negative 02/26/2014 1833   HGBUR SMALL (A) 07/05/2020 1028   BILIRUBINUR NEGATIVE 07/05/2020 1028   BILIRUBINUR Negative 02/26/2014 1833   KETONESUR NEGATIVE 07/05/2020 1028   PROTEINUR NEGATIVE 07/05/2020 1028   NITRITE NEGATIVE 07/05/2020 1028   LEUKOCYTESUR SMALL (A) 07/05/2020 1028   LEUKOCYTESUR 3+ 02/26/2014 1833   This document was prepared using Dragon Voice Recognition software and may include unintentional dictation errors.  Dr. Sedalia Muta [facility]  If 7PM-7AM, please contact overnight-coverage provider If 7AM-7PM, please contact day attending provider [email or web address]  02/18/2024, 8:31 PM

## 2024-02-18 NOTE — Hospital Course (Addendum)
 Ms. Jaquanna Ballentine is a 73 year old female with history of hypertension, anxiety, COPD, depression, atrial fibrillation suspect paroxysmal, presents emergency department for chief source of chest pain, fever or shortness of breath.  Vitals in the ED showed temperature 98.3, respiration 17, heart rate 70, blood pressure 126/78, SpO2 91% on room air.  Serum sodium is 136, potassium 3.6, chloride 100, bicarb 25, BUN of 13, serum creatinine 0.81, EGFR greater than 60, nonfasting glucose 78, WBC 6.7, hemoglobin 14.9, platelets of 217. Chest x-ray with no concerning infiltrate, findings suggestive of COPD Respiratory panel positive for influenza A ED treatment: DuoNebs 2 treatments, prednisone 60 mg p.o. one-time dose.  2/22: Vitals and labs stable on room air.  Checking respiratory viral panel to confirm the type of influenza A.  MRSA PCR also pending.  Starting on Tamiflu although symptoms are little over 2 days.  Patient was also given symptom management with Zithromax and prednisone along with cough medication with concern of COPD exacerbation secondary to influenza A.  Patient was provided with new prescriptions for her home inhaler, albuterol and DuoNeb, she has nebulizer machine at home but ran out of her medications.  She will continue on current medications and need to have a close follow-up with her provider for further assistance.

## 2024-02-18 NOTE — Assessment & Plan Note (Addendum)
Droplet precaution Check MRSA PCR

## 2024-02-18 NOTE — ED Provider Notes (Signed)
 [facility] Provider Note    Event Date/Time   First MD Initiated Contact with Patient 02/18/24 1409     (approximate)   History   Chest Pain   HPI  Sabrina Hudson is a 73 y.o. female  with history of a-fib on Eliquis, HTN, COPD and as listed in EMR presents to the emergency department for treatment and evaluation of fever and chest tightness.  Temperature has been as high as 102 at home.  She is also felt nauseated but has not vomited.  She has not had any diarrhea.  She has been monitoring her oxygen saturations at home and reports it has been around 88% in the evenings and first thing in the morning.  Symptoms ongoing for the last 2 days..      Physical Exam   Triage Vital Signs: ED Triage Vitals  Encounter Vitals Group     BP 02/18/24 1120 126/78     Systolic BP Percentile --      Diastolic BP Percentile --      Pulse Rate 02/18/24 1120 70     Resp 02/18/24 1120 17     Temp 02/18/24 1120 98.3 F (36.8 C)     Temp Source 02/18/24 1120 Oral     SpO2 02/18/24 1120 91 %     Weight 02/18/24 1121 184 lb 15.5 oz (83.9 kg)     Height 02/18/24 1121 5\' 6"  (1.676 m)     Head Circumference --      Peak Flow --      Pain Score 02/18/24 1121 8     Pain Loc --      Pain Education --      Exclude from Growth Chart --     Most recent vital signs: Vitals:   02/18/24 1500 02/18/24 1530  BP: (!) 126/56   Pulse: 66   Resp: 18   Temp:  98.3 F (36.8 C)  SpO2: 100%     General: Awake, no distress.  CV:  Good peripheral perfusion.  Resp:  Normal effort. Decreased air movement throughout. Expiratory wheezing throughout Abd:  No distention. Soft Other:     ED Results / Procedures / Treatments   Labs (all labs ordered are listed, but only abnormal results are displayed) Labs Reviewed  RESP PANEL BY RT-PCR (RSV, FLU A&B, COVID)  [id number] - Abnormal; Notable for the following components:      Result Value   Influenza A by PCR POSITIVE (*)    All  other components within normal limits  BASIC METABOLIC PANEL  CBC  APTT  TROPONIN I (HIGH SENSITIVITY)     EKG  Normal sinus rhythm with a rate of 66.  No change from previous   RADIOLOGY  Image and radiology report reviewed and interpreted by me. Radiology report consistent with the same.  No acute cardiopulmonary abnormality.  PROCEDURES:  Critical Care performed: No  Procedures   MEDICATIONS ORDERED IN ED:  Medications  predniSONE (DELTASONE) tablet 60 mg (60 mg Oral Given 02/18/24 1439)  ipratropium-albuterol (DUONEB) 0.5-2.5 (3) MG/3ML nebulizer solution 3 mL (3 mLs Nebulization Given 02/18/24 1439)  ipratropium-albuterol (DUONEB) 0.5-2.5 (3) MG/3ML nebulizer solution 3 mL (3 mLs Nebulization Given 02/18/24 1532)     IMPRESSION / MDM / ASSESSMENT AND PLAN / ED COURSE   I have reviewed the triage note.  Differential diagnosis includes, but is not limited to, COVID, influenza, RSV, cardiac event, COPD exacerbation  Patient's presentation is most consistent  with acute illness / injury with system symptoms.  73 year old female presenting to the emergency department for treatment and evaluation of fever with cough, chest tightness, and nausea that has been ongoing for the past 2 days.  Her home SpO2 monitor has read around 88% in the mornings and in the evenings.  She does not have a home oxygen requirement.   Labs, EKG, and chest x-ray performed while awaiting ER room assignment.  Initial troponin is normal, BMP is normal, and CBC is normal.  Respiratory panel is influenza A positive.  Chest x-ray shows no acute cardiopulmonary abnormality.  EKG shows a normal sinus rhythm and is unchanged from previous.  On exam, she does have decreased air movement throughout with expiratory wheezing throughout.  Plan will be to give her a DuoNeb and 60 mg of prednisone p.o.  Then reassess.  Also of note, the patient is currently wearing a Holter monitor.  She states that she has been  in and out of A-fib.  In the past, breathing treatments have caused her to go back into A-fib and when this happens, she has a difficult time breathing.  ----------------------------------------- 3:50 PM on 02/18/2024 ----------------------------------------- On reassessment, patient continues to have diffuse wheezing.  Second DuoNeb ordered.  Ambulatory saturation 90% on room air.  ----------------------------------------- 4:14 PM on 02/18/2024 ----------------------------------------- Better air movement after second DuoNeb, but patient not feeling any better.  She continues to have persistent cough and feels short of breath.  ----------------------------------------- 4:42 PM on 02/18/2024 ----------------------------------------- Patient accepted for admission by Dr. Sedalia Muta.     FINAL CLINICAL IMPRESSION(S) / ED DIAGNOSES   Final diagnoses:  None     Rx / DC Orders   ED Discharge Orders     None        Note:  This document was prepared using Dragon voice recognition software and may include unintentional dictation errors.   Chinita Pester, FNP 02/18/24 1642    Concha Se, MD 02/19/24 [phone]

## 2024-02-18 NOTE — Assessment & Plan Note (Signed)
And epigastric and abdominal wall pain secondary to coughing Lidocaine 1-2 patch ordered

## 2024-02-18 NOTE — ED Notes (Signed)
Informed RN bed assigned 

## 2024-02-18 NOTE — Assessment & Plan Note (Signed)
 Hydralazine 5 mg every 6 hours as needed for SBP greater 165, 5 days ordered

## 2024-02-18 NOTE — ED Triage Notes (Signed)
Pt here with cp and a fever. Pt states the pain feels "raw" like a sore throat. Pt states she has had a 102 temp for 2 days. Pt endorses nausea but states she has not had a bowel movement like normal. Pt states her oxygen has been 86-88% at home, mostly in the mornings and evenings. Pt endorses SOB as well.

## 2024-02-18 NOTE — Assessment & Plan Note (Signed)
Patient is currently wearing Holter monitor outpatient order

## 2024-02-18 NOTE — Assessment & Plan Note (Signed)
 CPAP nightly

## 2024-02-18 NOTE — ED Notes (Signed)
Oxygen saturations 90% after ambulation around unit at this time. Oxygen saturations 93%-96% during ambulation.

## 2024-02-18 NOTE — Assessment & Plan Note (Signed)
Guaifenesin 5 mL every 6 hours as needed to loosen phlegm, 3 days ordered Tussionex p.o. nightly as needed for cough, 2 days ordered

## 2024-02-19 DIAGNOSIS — K219 Gastro-esophageal reflux disease without esophagitis: Secondary | ICD-10-CM | POA: Diagnosis not present

## 2024-02-19 DIAGNOSIS — I4891 Unspecified atrial fibrillation: Secondary | ICD-10-CM | POA: Diagnosis not present

## 2024-02-19 DIAGNOSIS — I10 Essential (primary) hypertension: Secondary | ICD-10-CM

## 2024-02-19 DIAGNOSIS — G4733 Obstructive sleep apnea (adult) (pediatric): Secondary | ICD-10-CM

## 2024-02-19 DIAGNOSIS — R051 Acute cough: Secondary | ICD-10-CM

## 2024-02-19 DIAGNOSIS — R0789 Other chest pain: Secondary | ICD-10-CM

## 2024-02-19 DIAGNOSIS — J101 Influenza due to other identified influenza virus with other respiratory manifestations: Secondary | ICD-10-CM | POA: Diagnosis not present

## 2024-02-19 DIAGNOSIS — J441 Chronic obstructive pulmonary disease with (acute) exacerbation: Secondary | ICD-10-CM | POA: Diagnosis not present

## 2024-02-19 LAB — CBC
HCT: 41.4 % (ref 36.0–46.0)
Hemoglobin: 14.2 g/dL (ref 12.0–15.0)
MCH: 31 pg (ref 26.0–34.0)
MCHC: 34.3 g/dL (ref 30.0–36.0)
MCV: 90.4 fL (ref 80.0–100.0)
Platelets: 204 10*3/uL (ref 150–400)
RBC: 4.58 MIL/uL (ref 3.87–5.11)
RDW: 13.4 % (ref 11.5–15.5)
WBC: 4.7 10*3/uL (ref 4.0–10.5)
nRBC: 0 % (ref 0.0–0.2)

## 2024-02-19 LAB — BASIC METABOLIC PANEL
Anion gap: 11 (ref 5–15)
BUN: 15 mg/dL (ref 8–23)
CO2: 26 mmol/L (ref 22–32)
Calcium: 9.4 mg/dL (ref 8.9–10.3)
Chloride: 103 mmol/L (ref 98–111)
Creatinine, Ser: 0.71 mg/dL (ref 0.44–1.00)
GFR, Estimated: >60 mL/min (ref >60)
Glucose, Bld: 125 mg/dL — ABNORMAL HIGH (ref 70–99)
Potassium: 4.5 mmol/L (ref 3.5–5.1)
Sodium: 140 mmol/L (ref 135–145)

## 2024-02-19 LAB — RESPIRATORY PANEL BY PCR

## 2024-02-19 MED ORDER — BENZONATATE 100 MG PO CAPS: 100.0000 mg | ORAL_CAPSULE | Freq: Three times a day (TID) | ORAL | 0 refills | Status: AC | PRN

## 2024-02-19 MED ORDER — IPRATROPIUM-ALBUTEROL 0.5-2.5 (3) MG/3ML IN SOLN: 3.0000 mL | Freq: Four times a day (QID) | RESPIRATORY_TRACT | 1 refills | Status: AC | PRN

## 2024-02-19 MED ORDER — PANTOPRAZOLE SODIUM 40 MG PO TBEC
40.0000 mg | DELAYED_RELEASE_TABLET | Freq: Every day | ORAL | Status: DC
  Administered 2024-02-19: 40 mg via ORAL
  Filled 2024-02-19: qty 1

## 2024-02-19 MED ORDER — OSELTAMIVIR PHOSPHATE 75 MG PO CAPS: 75.0000 mg | ORAL_CAPSULE | Freq: Two times a day (BID) | ORAL | 0 refills | Status: AC

## 2024-02-19 MED ORDER — ALBUTEROL SULFATE HFA 108 (90 BASE) MCG/ACT IN AERS
2.0000 | INHALATION_SPRAY | Freq: Four times a day (QID) | RESPIRATORY_TRACT | 2 refills | Status: AC | PRN
Start: 2024-02-19 — End: ?

## 2024-02-19 MED ORDER — PREDNISONE 20 MG PO TABS: 40.0000 mg | ORAL_TABLET | Freq: Every day | ORAL | 0 refills | Status: AC

## 2024-02-19 MED ORDER — AZITHROMYCIN 250 MG PO TABS: ORAL_TABLET | ORAL | 0 refills | Status: AC

## 2024-02-19 MED ORDER — EZETIMIBE 10 MG PO TABS
10.0000 mg | ORAL_TABLET | Freq: Every day | ORAL | Status: DC
  Administered 2024-02-19: 10 mg via ORAL
  Filled 2024-02-19: qty 1

## 2024-02-19 MED ORDER — DULOXETINE HCL 30 MG PO CPEP
60.0000 mg | ORAL_CAPSULE | Freq: Every day | ORAL | Status: DC
  Administered 2024-02-19: 60 mg via ORAL
  Filled 2024-02-19: qty 2

## 2024-02-19 MED ORDER — DM-GUAIFENESIN ER 30-600 MG PO TB12
1.0000 | ORAL_TABLET | Freq: Two times a day (BID) | ORAL | 0 refills | Status: AC | PRN
Start: 2024-02-19 — End: ?

## 2024-02-19 MED ORDER — LORAZEPAM 0.5 MG PO TABS: 0.5000 mg | ORAL_TABLET | Freq: Two times a day (BID) | ORAL | Status: DC | PRN

## 2024-02-19 MED ORDER — OSELTAMIVIR PHOSPHATE 75 MG PO CAPS
75.0000 mg | ORAL_CAPSULE | Freq: Two times a day (BID) | ORAL | Status: DC
  Administered 2024-02-19: 75 mg via ORAL
  Filled 2024-02-19: qty 1

## 2024-02-19 MED ORDER — UMECLIDINIUM-VILANTEROL 62.5-25 MCG/ACT IN AEPB: 1.0000 | INHALATION_SPRAY | Freq: Every day | RESPIRATORY_TRACT | 0 refills | Status: AC

## 2024-02-19 NOTE — Discharge Summary (Signed)
 Physician Discharge Summary   Patient: Sabrina Hudson MRN: [id number] DOB: [birth year]  Admit date:     02/18/2024  Discharge date: 02/19/24  Discharge Physician: Arnetha Courser   PCP: Kandyce Rud, MD   Recommendations at discharge:  Please obtain CBC and BMP and follow-up Follow-up with primary care provider within a week  Discharge Diagnoses: Principal Problem:   COPD exacerbation (HCC) Active Problems:   HTN (hypertension)   Atrial fibrillation (HCC)   GERD without esophagitis   Depression   OSA (obstructive sleep apnea)   Influenza A   Musculoskeletal chest pain   Cough  Resolved Problems:   COPD with acute exacerbation [facility])  Hospital Course: Ms. Sabrina Hudson is a 73 year old female with history of hypertension, anxiety, COPD, depression, atrial fibrillation suspect paroxysmal, presents emergency department for chief source of chest pain, fever or shortness of breath.  Vitals in the ED showed temperature 98.3, respiration 17, heart rate 70, blood pressure 126/78, SpO2 91% on room air.  Serum sodium is 136, potassium 3.6, chloride 100, bicarb 25, BUN of 13, serum creatinine 0.81, EGFR greater than 60, nonfasting glucose 78, WBC 6.7, hemoglobin 14.9, platelets of 217. Chest x-ray with no concerning infiltrate, findings suggestive of COPD Respiratory panel positive for influenza A ED treatment: DuoNebs 2 treatments, prednisone 60 mg p.o. one-time dose.  2/22: Vitals and labs stable on room air.  Checking respiratory viral panel to confirm the type of influenza A.  MRSA PCR also pending.  Starting on Tamiflu although symptoms are little over 2 days.  Patient was also given symptom management with Zithromax and prednisone along with cough medication with concern of COPD exacerbation secondary to influenza A.  Patient was provided with new prescriptions for her home inhaler, albuterol and DuoNeb, she has nebulizer machine at home but ran out of her medications.  She  will continue on current medications and need to have a close follow-up with her provider for further assistance.  Assessment and Plan: * COPD exacerbation (HCC) Suspect secondary to influenza type. RVP was ordered to check the type of influenza A. Patient was given supportive care with steroid, Zithromax and symptom management.  Cough Guaifenesin 5 mL every 6 hours as needed to loosen phlegm, 3 days ordered  Musculoskeletal chest pain And epigastric and abdominal wall pain secondary to coughing Lidocaine 1-2 patch ordered  Influenza A Droplet precaution Started on Tamiflu  OSA (obstructive sleep apnea) CPAP nightly  Atrial fibrillation (HCC) Patient is currently wearing Holter monitor outpatient order  HTN (hypertension) Hydralazine 5 mg every 6 hours as needed for SBP greater 165, 5 days ordered   Consultants: None Procedures performed: None Disposition: Home Diet recommendation:  Discharge Diet Orders (From admission, onward)     Start     Ordered   02/19/24 0000  Diet - low sodium heart healthy        02/19/24 1159           Cardiac diet DISCHARGE MEDICATION: Allergies as of 02/19/2024       Reactions   Bupropion Other (See Comments)   Bad Dreams, Depression, Confusion   Gabapentin    Other reaction(s): Other (See Comments) Confusion   Simvastatin Other (See Comments)   Muscle pain        Medication List     TAKE these medications    acetaminophen 500 MG tablet Commonly known as: TYLENOL Take 500 mg by mouth every 6 (six) hours as needed for moderate pain.   albuterol 108 (  90 Base) MCG/ACT inhaler Commonly known as: VENTOLIN HFA Inhale 2 puffs into the lungs every 6 (six) hours as needed for wheezing or shortness of breath.   apixaban 5 MG Tabs tablet Commonly known as: ELIQUIS Take 1 tablet (5 mg total) by mouth 2 (two) times daily.   azithromycin 250 MG tablet Commonly known as: Zithromax Z-Pak Take 2 tablets (500 mg) on  Day 1,   followed by 1 tablet (250 mg) once daily on Days 2 through 5.   benzonatate 100 MG capsule Commonly known as: Tessalon Perles Take 1 capsule (100 mg total) by mouth 3 (three) times daily as needed for cough.   dextromethorphan-guaiFENesin 30-600 MG 12hr tablet Commonly known as: MUCINEX DM Take 1 tablet by mouth 2 (two) times daily as needed for cough.   diltiazem 240 MG 24 hr capsule Commonly known as: CARDIZEM CD Take 1 capsule (240 mg total) by mouth daily.   diltiazem 30 MG tablet Commonly known as: CARDIZEM Take 30 mg by mouth daily as needed (heart rate/pulse increase to greater than 100.).   DULoxetine 60 MG capsule Commonly known as: CYMBALTA Take 60 mg by mouth daily.   ezetimibe 10 MG tablet Commonly known as: ZETIA Take 10 mg by mouth daily.   fluticasone 50 MCG/ACT nasal spray Commonly known as: FLONASE Place 1 spray into both nostrils daily as needed for allergies or rhinitis.   ipratropium-albuterol 0.5-2.5 (3) MG/3ML Soln Commonly known as: DUONEB Take 3 mLs by nebulization every 6 (six) hours as needed.   LORazepam 0.5 MG tablet Commonly known as: ATIVAN Take 0.5 mg by mouth 2 (two) times daily as needed for anxiety.   metoprolol tartrate 25 MG tablet Commonly known as: LOPRESSOR Take 25 mg by mouth 2 (two) times daily.   nicotine 7 mg/24hr patch Commonly known as: NICODERM CQ - dosed in mg/24 hr One 7mg  patch chest wall daily (okay to substitute generic)   omeprazole 40 MG capsule Commonly known as: PRILOSEC Take 40 mg by mouth daily.   oseltamivir 75 MG capsule Commonly known as: TAMIFLU Take 1 capsule (75 mg total) by mouth 2 (two) times daily for 5 days.   predniSONE 20 MG tablet Commonly known as: DELTASONE Take 2 tablets (40 mg total) by mouth daily with breakfast for 5 days. What changed:  how much to take how to take this when to take this additional instructions   umeclidinium-vilanterol 62.5-25 MCG/ACT Aepb Commonly known as:  ANORO ELLIPTA Inhale 1 puff into the lungs daily.        Follow-up Information     Kandyce Rud, MD. Schedule an appointment as soon as possible for a visit in 1 week(s).   Specialty: Family Medicine Contact information: [address] [facility] [place] [address] [phone]                Discharge Exam: Ceasar Mons Weights   02/18/24 1121  Weight: 83.9 kg   General.  Well-developed elderly lady, in no acute distress. Pulmonary.  Scant mild wheeze bilaterally, normal respiratory effort. CV.  Regular rate and rhythm, no JVD, rub or murmur. Abdomen.  Soft, nontender, nondistended, BS positive. CNS.  Alert and oriented .  No focal neurologic deficit. Extremities.  No edema, no cyanosis, pulses intact and symmetrical. Psychiatry.  Judgment and insight appears normal.   Condition at discharge: stable  The results of significant diagnostics from this hospitalization (including imaging, microbiology, ancillary and laboratory) are listed  below for reference.   Imaging Studies: DG Chest 2 View Result Date: 02/18/2024 CLINICAL DATA:  Chest pain and fever. EXAM: CHEST - 2 VIEW COMPARISON:  Chest radiographs 06/16/2023 FINDINGS: Cardiac silhouette and mediastinal contours are within normal limits. Mild-to-moderate calcification within the aortic arch. Findings the diaphragms and increased AP diameter chest again suggesting COPD. Lungs are clear. No pleural effusion or pneumothorax. Mild-to-moderate multilevel degenerative disc changes of the thoracic spine. Electronic device overlies the anterior left chest wall. IMPRESSION: 1. No active cardiopulmonary disease. 2. Findings again suggesting COPD. Electronically Signed   By: Neita Garnet M.D.   On: 02/18/2024 13:11    Microbiology: Results for orders placed or performed during the hospital encounter of 02/18/24  Resp panel by RT-PCR (RSV, Flu A&B, Covid) Anterior Nasal Swab     Status:  Abnormal   Collection Time: 02/18/24 11:25 AM   Specimen: Anterior Nasal Swab  Result Value Ref Range Status   SARS Coronavirus 2 by RT PCR NEGATIVE NEGATIVE Final    Comment: (NOTE) SARS-CoV-2 target nucleic acids are NOT DETECTED.  The SARS-CoV-2 RNA is generally detectable in upper respiratory specimens during the acute phase of infection. The lowest concentration of SARS-CoV-2 viral copies this assay can detect is 138 copies/mL. A negative result does not preclude SARS-Cov-2 infection and should not be used as the sole basis for treatment or other patient management decisions. A negative result may occur with  improper specimen collection/handling, submission of specimen other than nasopharyngeal swab, presence of viral mutation(s) within the areas targeted by this assay, and inadequate number of viral copies(<138 copies/mL). A negative result must be combined with clinical observations, patient history, and epidemiological information. The expected result is Negative.  Fact Sheet for Patients:  BloggerCourse.com  Fact Sheet for Healthcare Providers:  SeriousBroker.it  This test is no t yet approved or cleared by the [place] FDA and  has been authorized for detection and/or diagnosis of SARS-CoV-2 by FDA under an Emergency Use Authorization (EUA). This EUA will remain  in effect (meaning this test can be used) for the duration of the COVID-19 declaration under Section 564(b)(1) of the Act, 21 U.S.C.section 360bbb-3(b)(1), unless the authorization is terminated  or revoked sooner.       Influenza A by PCR POSITIVE (A) NEGATIVE Final   Influenza B by PCR NEGATIVE NEGATIVE Final    Comment: (NOTE) The Xpert Xpress SARS-CoV-2/FLU/RSV plus assay is intended as an aid in the diagnosis of influenza from Nasopharyngeal swab specimens and should not be used as a sole basis for treatment. Nasal washings and aspirates are  unacceptable for Xpert Xpress SARS-CoV-2/FLU/RSV testing.  Fact Sheet for Patients: BloggerCourse.com  Fact Sheet for Healthcare Providers: SeriousBroker.it  This test is not yet approved or cleared by the [place] FDA and has been authorized for detection and/or diagnosis of SARS-CoV-2 by FDA under an Emergency Use Authorization (EUA). This EUA will remain in effect (meaning this test can be used) for the duration of the COVID-19 declaration under Section 564(b)(1) of the Act, 21 U.S.C. section 360bbb-3(b)(1), unless the authorization is terminated or revoked.     Resp Syncytial Virus by PCR NEGATIVE NEGATIVE Final    Comment: (NOTE) Fact Sheet for Patients: BloggerCourse.com  Fact Sheet for Healthcare Providers: SeriousBroker.it  This test is not yet approved or cleared by the [place] FDA and has been authorized for detection and/or diagnosis of SARS-CoV-2 by FDA under an Emergency Use Authorization (EUA). This EUA will remain in effect (  meaning this test can be used) for the duration of the COVID-19 declaration under Section 564(b)(1) of the Act, 21 U.S.C. section 360bbb-3(b)(1), unless the authorization is terminated or revoked.  Performed at [facility], [address] [place], [place] [address]     Labs: CBC: Recent Labs  Lab 02/18/24 1125 02/19/24 0351  WBC 6.7 4.7  HGB 14.9 14.2  HCT 43.8 41.4  MCV 91.4 90.4  PLT 217 204   Basic Metabolic Panel: Recent Labs  Lab 02/18/24 1125 02/19/24 0351  NA 136 140  K 3.6 4.5  CL 100 103  CO2 25 26  GLUCOSE 78 125*  BUN 13 15  CREATININE 0.81 0.71  CALCIUM 9.0 9.4   Liver Function Tests: No results for input(s): "AST", "ALT", "ALKPHOS", "BILITOT", "PROT", "ALBUMIN" in the last 168 hours. CBG: No results for input(s): "GLUCAP" in the last 168 hours.  Discharge time spent: greater than  30 minutes.  This record has been created using Conservation officer, historic buildings. Errors have been sought and corrected,but may not always be located. Such creation errors do not reflect on the standard of care.   Signed: Arnetha Courser, MD [facility] 02/19/2024

## 2024-02-19 NOTE — TOC Transition Note (Signed)
 Transition of Care [facility]) - Discharge Note   Patient Details  Name: Sabrina Hudson MRN: [id number] Date of Birth: 1951-04-05  Transition of Care [facility]) CM/SW Contact:  Bing Quarry, RN Phone Number: 02/19/2024, 12:10 PM   Clinical Narrative: 2/22:OBS status at 19 hours 19 min. Discharge orders in. +Flu A. No TOC consult. DC to Home/self care. Lives alone but has grandson that checks on her. Works from home in [employer]. NO SDOH Flags. Has PCP.   Gabriel Cirri MSN RN CM  RN Case Manager [place]  Transitions of Care Direct Dial: [phone] (Weekends Only) [facility] Main Office Phone: [phone] [facility] Fax: [phone] [email or web address]   Transition of Care (TOC) - Inpatient Brief Assessment Transition of Care Asessment: Insurance and Status: Insurance coverage has been reviewed Patient has primary care physician: Yes Home environment has been reviewed: From home with grandson. Prior level of function:: No PT evaluations, works from home in [place], lives near grandson. Prior/Current Home Services: No current home services Social Drivers of Health Review: SDOH reviewed no interventions necessary Readmission risk has been reviewed: Yes (Not available in EPIC yet. OBS status.) Transition of care needs: no transition of care needs at this time   Patient Goals and CMS Choice: NA  Expected Discharge Plan and Services: Home/self care.   Expected Discharge Date: 02/19/24                Prior Living Arrangements/Services: From home lives alone, works from home.      Admission diagnosis:  Influenza A [J10.1] COPD exacerbation (HCC) [J44.1] COPD with acute exacerbation (HCC) [J44.1] Patient Active Problem List   Diagnosis Date Noted   Influenza A 02/18/2024   Musculoskeletal chest pain 02/18/2024   Cough 02/18/2024   OSA (obstructive sleep apnea) 10/24/2022   GI bleeding 01/01/2022   Colitis 12/31/2021   Depression 12/31/2021   Acute GI bleeding 12/31/2021   Bilateral  carotid artery disease (HCC) 01/28/2021   S/P hip replacement 07/09/2020   Bradycardia 06/20/2020   Atrial fibrillation (HCC) 06/03/2020   Smoking    Atrial fibrillation with rapid ventricular response (HCC) 05/21/2020   HTN (hypertension) 05/20/2020   COPD exacerbation (HCC) 05/20/2020   COPD, mild (HCC) 06/01/2019   Pure hypercholesterolemia 02/05/2015   GERD without esophagitis 02/05/2015   PCP:  Kandyce Rud, MD Pharmacy:   [phone] - [place], [place] - [address] [place] [address] Phone: [phone] Fax: [phone]   Social Determinants of Health (SDOH) Interventions: No SDOH alerts.     Readmission Risk Interventions     No data to display           Social Drivers of Health (SDOH) Interventions SDOH Screenings   Food Insecurity: No Food Insecurity (02/18/2024)  Housing: Low Risk  (02/18/2024)  Transportation Needs: No Transportation Needs (02/18/2024)  Utilities: Not At Risk (02/18/2024)  Financial Resource Strain: Low Risk  (12/14/2023)   Received from [facility]  Tobacco Use: Medium Risk (02/18/2024)    Readmission Risk Interventions     No data to display            Gabriel Cirri MSN RN CM  RN Case Manager   Transitions of Care Direct Dial: [phone] (Weekends Only) [facility] Main Office Phone: [phone] [facility] Fax: [phone] [email or web address]

## 2024-02-19 NOTE — Plan of Care (Signed)

## 2024-02-19 NOTE — Progress Notes (Signed)
       CROSS COVER NOTE  NAME: Sabrina Hudson MRN: [id number] DOB : 1951/09/06 ATTENDING PHYSICIAN: Cox, Amy Dorris Carnes, DO    Date of Service   02/19/2024   HPI/Events of Note   Home metoprolol and eliquis requested  Interventions   meds  and last doses confirmed Reordered home metoprolol and eliquis. SQ lovenox discontinued     Donnie Mesa NP [facility] Cross Cover 7pm-7am - check amion for availability Pager [phone]
